# Patient Record
Sex: Female | Born: 1960 | Race: White | Hispanic: No | Marital: Single | Smoking: Never smoker
Health system: Southern US, Community
[De-identification: ages and names within clinical notes are randomized; demographics above are authoritative.]

## PROBLEM LIST (undated history)

## (undated) DIAGNOSIS — K219 Gastro-esophageal reflux disease without esophagitis: Secondary | ICD-10-CM

## (undated) DIAGNOSIS — F32A Depression, unspecified: Secondary | ICD-10-CM

## (undated) DIAGNOSIS — F329 Major depressive disorder, single episode, unspecified: Secondary | ICD-10-CM

## (undated) DIAGNOSIS — I7121 Aneurysm of the ascending aorta, without rupture: Secondary | ICD-10-CM

## (undated) DIAGNOSIS — C50919 Malignant neoplasm of unspecified site of unspecified female breast: Secondary | ICD-10-CM

## (undated) DIAGNOSIS — M1990 Unspecified osteoarthritis, unspecified site: Secondary | ICD-10-CM

## (undated) DIAGNOSIS — I10 Essential (primary) hypertension: Secondary | ICD-10-CM

## (undated) DIAGNOSIS — Z9889 Other specified postprocedural states: Secondary | ICD-10-CM

## (undated) DIAGNOSIS — T4145XA Adverse effect of unspecified anesthetic, initial encounter: Secondary | ICD-10-CM

## (undated) DIAGNOSIS — F419 Anxiety disorder, unspecified: Secondary | ICD-10-CM

## (undated) DIAGNOSIS — I499 Cardiac arrhythmia, unspecified: Secondary | ICD-10-CM

## (undated) DIAGNOSIS — T8859XA Other complications of anesthesia, initial encounter: Secondary | ICD-10-CM

## (undated) DIAGNOSIS — J189 Pneumonia, unspecified organism: Secondary | ICD-10-CM

## (undated) DIAGNOSIS — R600 Localized edema: Secondary | ICD-10-CM

## (undated) DIAGNOSIS — R112 Nausea with vomiting, unspecified: Secondary | ICD-10-CM

## (undated) DIAGNOSIS — D649 Anemia, unspecified: Secondary | ICD-10-CM

## (undated) DIAGNOSIS — K429 Umbilical hernia without obstruction or gangrene: Secondary | ICD-10-CM

## (undated) HISTORY — PX: MULTIPLE TOOTH EXTRACTIONS: SHX2053

## (undated) HISTORY — PX: RECONSTRUCTION BREAST IMMEDIATE / DELAYED W/ TISSUE EXPANDER: SUR1077

## (undated) HISTORY — PX: TOE OSTEOTOMY: SHX1071

## (undated) HISTORY — PX: HERNIA REPAIR: SHX51

## (undated) HISTORY — PX: ESOPHAGOGASTRODUODENOSCOPY: SHX1529

## (undated) HISTORY — PX: COLONOSCOPY: SHX174

## (undated) HISTORY — PX: MASTECTOMY: SHX3

---

## 1987-10-17 DIAGNOSIS — J189 Pneumonia, unspecified organism: Secondary | ICD-10-CM | POA: Insufficient documentation

## 1987-10-17 HISTORY — DX: Pneumonia, unspecified organism: J18.9

## 2010-10-16 DIAGNOSIS — C50919 Malignant neoplasm of unspecified site of unspecified female breast: Secondary | ICD-10-CM

## 2010-10-16 DIAGNOSIS — F419 Anxiety disorder, unspecified: Secondary | ICD-10-CM | POA: Insufficient documentation

## 2010-10-16 HISTORY — DX: Malignant neoplasm of unspecified site of unspecified female breast: C50.919

## 2010-10-16 HISTORY — PX: ABLATION: SHX5711

## 2010-10-16 HISTORY — PX: BREAST SURGERY: SHX581

## 2011-07-13 ENCOUNTER — Ambulatory Visit (HOSPITAL_BASED_OUTPATIENT_CLINIC_OR_DEPARTMENT_OTHER)
Admission: RE | Admit: 2011-07-13 | Discharge: 2011-07-13 | Disposition: A | Discharge status: Home / Self Care | Payer: Medicaid Other | Source: Ambulatory Visit | Attending: Plastic Surgery | Admitting: Plastic Surgery

## 2011-07-13 ENCOUNTER — Other Ambulatory Visit: Payer: Self-pay | Admitting: Plastic Surgery

## 2011-07-13 DIAGNOSIS — C50919 Malignant neoplasm of unspecified site of unspecified female breast: Secondary | ICD-10-CM | POA: Insufficient documentation

## 2011-07-13 DIAGNOSIS — Z9010 Acquired absence of unspecified breast and nipple: Secondary | ICD-10-CM | POA: Insufficient documentation

## 2011-07-13 DIAGNOSIS — I10 Essential (primary) hypertension: Secondary | ICD-10-CM | POA: Insufficient documentation

## 2011-07-13 LAB — POCT I-STAT, CHEM 8
BUN: 11 mg/dL (ref 6–23)
Calcium, Ion: 1.14 mmol/L (ref 1.12–1.32)
Chloride: 102 mEq/L (ref 96–112)
Creatinine, Ser: 0.7 mg/dL (ref 0.50–1.10)
Glucose, Bld: 107 mg/dL — ABNORMAL HIGH (ref 70–99)

## 2011-07-19 NOTE — Op Note (Signed)
**Note De-Identified [name] Obfuscation** NAMEMarland Kitchen  [name], Janet [name]            ACCOUNT NO.:  [id number]  MEDICAL RECORD NO.:  [id number]  LOCATION:                                 FACILITY:  PHYSICIAN:  Wayland Denis, DO      DATE OF BIRTH:  1960-11-27  DATE OF PROCEDURE:  07/13/2011 DATE OF DISCHARGE:                              OPERATIVE REPORT   PREOPERATIVE DIAGNOSIS:  Left breast cancer status post mastectomy.  POSTOPERATIVE DIAGNOSIS:  Left breast cancer status post mastectomy.  PROCEDURE:  Left breast reconstruction with Expander and Flex HD placement.  ATTENDING SURGEON:  Wayland Denis, DO  ANESTHESIA:  General.  INDICATIONS FOR PROCEDURE:  The patient is a 50 year old white female who underwent left mastectomy.  She did not require radiation and she presents for reconstruction.  She was seen in the holding.  Risks and complications were explained.  Consent was confirmed.  DESCRIPTION OF PROCEDURE:  The patient was taken to the operating room, placed on the operating room table in a supine position.  General anesthesia was administered.  Once adequate, time-out was called.  All information was confirmed to be correct.  She was prepped and draped in the usual sterile fashion.  A 15-blade was used to make an incision on either side of the mastectomy scar in order to remove it and send it to Pathology.  Bovie was then used to dissect down to the pectoralis major muscle.  There was quite a bit of scarring, but we were able to free the pectoralis from the skin flaps. The Bovie was then used to dissect underneath the pectoralis and create a pocket.  The Flex HD 4 x 16 cm was prepared according to the manufacture's guidelines, it was placed into the pocket with the porous side up.  It was then tacked with simple interrupted and running 2-0 PDS to the edge of the pectoralis major muscle and then to the inframammary fold.  It was fenestrated prior to placing it in.  The 450 Mentor medium profile style 7200 Expander  was prepared according to the manufacture guidelines.  The air was evacuated, it was soaked in antibiotic solution, and then it was placed into the pocket under the Flex HD in the pectoralis major muscle.  150 mL of injectable saline was infused into the Expander.  The Flex HD was then tacked laterally with a  2-0 PDS.  Hemostasis was achieved using electrocautery.  Prior to placing, the Flex HD and the Expander, the pocket was irrigated with normal saline and antibiotic solution.  A 15-blade was used to create an entry for 19-Blake drain.  The drain was tacked to the skin with 3-0 silk and connected to the bulb suction.  The deep layers of the incision were closed with 3-0 Vicryl and 4-0 Vicryl and a running subcuticular 5-0 Monocryl.  Dermabond was applied.  ABDs and 4 inch gauze were used as a dressing with a breast binder.  The patient tolerated the procedure well.  There were no complications.  The stickers for the Flex HD and Expander are in the chart.    Claire Sanger, DO     CS/MEDQ  D:  07/13/2011  T:  07/13/2011  Job:  [id number]  Electronically Signed by Wayland Denis  on 07/19/2011 02:07:21 PM

## 2011-10-19 ENCOUNTER — Encounter (HOSPITAL_BASED_OUTPATIENT_CLINIC_OR_DEPARTMENT_OTHER): Payer: Self-pay | Admitting: *Deleted

## 2011-10-20 ENCOUNTER — Encounter (HOSPITAL_BASED_OUTPATIENT_CLINIC_OR_DEPARTMENT_OTHER): Payer: Self-pay | Admitting: *Deleted

## 2011-10-20 NOTE — Progress Notes (Signed)
Pt had tissue exp here 9/12

## 2011-10-23 ENCOUNTER — Ambulatory Visit (HOSPITAL_BASED_OUTPATIENT_CLINIC_OR_DEPARTMENT_OTHER)
Admission: RE | Admit: 2011-10-23 | Discharge: 2011-10-23 | Disposition: A | Discharge status: Home / Self Care | Payer: Medicaid Other | Source: Ambulatory Visit | Attending: Oral Surgery | Admitting: Oral Surgery

## 2011-10-23 ENCOUNTER — Ambulatory Visit (HOSPITAL_BASED_OUTPATIENT_CLINIC_OR_DEPARTMENT_OTHER): Payer: Medicaid Other | Admitting: Anesthesiology

## 2011-10-23 ENCOUNTER — Encounter (HOSPITAL_BASED_OUTPATIENT_CLINIC_OR_DEPARTMENT_OTHER): Payer: Self-pay | Admitting: Anesthesiology

## 2011-10-23 ENCOUNTER — Encounter (HOSPITAL_BASED_OUTPATIENT_CLINIC_OR_DEPARTMENT_OTHER)
Admission: RE | Disposition: A | Discharge status: Home / Self Care | Payer: Self-pay | Source: Ambulatory Visit | Attending: Oral Surgery

## 2011-10-23 ENCOUNTER — Encounter (HOSPITAL_BASED_OUTPATIENT_CLINIC_OR_DEPARTMENT_OTHER): Payer: Self-pay

## 2011-10-23 DIAGNOSIS — I10 Essential (primary) hypertension: Secondary | ICD-10-CM | POA: Insufficient documentation

## 2011-10-23 DIAGNOSIS — Z853 Personal history of malignant neoplasm of breast: Secondary | ICD-10-CM | POA: Insufficient documentation

## 2011-10-23 DIAGNOSIS — K029 Dental caries, unspecified: Secondary | ICD-10-CM | POA: Insufficient documentation

## 2011-10-23 DIAGNOSIS — E669 Obesity, unspecified: Secondary | ICD-10-CM | POA: Insufficient documentation

## 2011-10-23 HISTORY — DX: Other complications of anesthesia, initial encounter: T88.59XA

## 2011-10-23 HISTORY — DX: Essential (primary) hypertension: I10

## 2011-10-23 HISTORY — DX: Malignant neoplasm of unspecified site of unspecified female breast: C50.919

## 2011-10-23 HISTORY — DX: Adverse effect of unspecified anesthetic, initial encounter: T41.45XA

## 2011-10-23 HISTORY — PX: MULTIPLE EXTRACTIONS WITH ALVEOLOPLASTY: SHX5342

## 2011-10-23 SURGERY — MULTIPLE EXTRACTION WITH ALVEOLOPLASTY
Anesthesia: General | Site: Mouth | Wound class: Clean Contaminated

## 2011-10-23 MED ORDER — LIDOCAINE-EPINEPHRINE 2 %-1:100000 IJ SOLN
INTRAMUSCULAR | Status: DC | PRN
  Administered 2011-10-23: 13 mL

## 2011-10-23 MED ORDER — MIDAZOLAM HCL 2 MG/2ML IJ SOLN: 0.5000 mg | INTRAMUSCULAR | Status: DC | PRN

## 2011-10-23 MED ORDER — LACTATED RINGERS IV SOLN
INTRAVENOUS | Status: DC
  Administered 2011-10-23: 08:00:00 via INTRAVENOUS

## 2011-10-23 MED ORDER — OXYCODONE-ACETAMINOPHEN 5-325 MG PO TABS
1.0000 | ORAL_TABLET | ORAL | Status: DC | PRN
  Administered 2011-10-23: 1 via ORAL

## 2011-10-23 MED ORDER — FENTANYL CITRATE 0.05 MG/ML IJ SOLN
INTRAMUSCULAR | Status: DC | PRN
  Administered 2011-10-23: 50 ug via INTRAVENOUS

## 2011-10-23 MED ORDER — DEXAMETHASONE SODIUM PHOSPHATE 4 MG/ML IJ SOLN
INTRAMUSCULAR | Status: DC | PRN
  Administered 2011-10-23: 10 mg via INTRAVENOUS

## 2011-10-23 MED ORDER — FENTANYL CITRATE 0.05 MG/ML IJ SOLN: 50.0000 ug | INTRAMUSCULAR | Status: DC | PRN

## 2011-10-23 MED ORDER — MORPHINE SULFATE 2 MG/ML IJ SOLN: 0.0500 mg/kg | INTRAMUSCULAR | Status: DC | PRN

## 2011-10-23 MED ORDER — METOCLOPRAMIDE HCL 5 MG/ML IJ SOLN
INTRAMUSCULAR | Status: DC | PRN
  Administered 2011-10-23: 10 mg via INTRAVENOUS

## 2011-10-23 MED ORDER — SUCCINYLCHOLINE CHLORIDE 20 MG/ML IJ SOLN
INTRAMUSCULAR | Status: DC | PRN
  Administered 2011-10-23: 100 mg via INTRAVENOUS

## 2011-10-23 MED ORDER — FENTANYL CITRATE 0.05 MG/ML IJ SOLN: 25.0000 ug | INTRAMUSCULAR | Status: DC | PRN

## 2011-10-23 MED ORDER — PROPOFOL 10 MG/ML IV EMUL
INTRAVENOUS | Status: DC | PRN
  Administered 2011-10-23: 250 mg via INTRAVENOUS

## 2011-10-23 MED ORDER — OXYCODONE-ACETAMINOPHEN 5-325 MG PO TABS: 1.0000 | ORAL_TABLET | ORAL | Status: AC | PRN

## 2011-10-23 MED ORDER — METOCLOPRAMIDE HCL 5 MG/ML IJ SOLN: 10.0000 mg | Freq: Once | INTRAMUSCULAR | Status: DC | PRN

## 2011-10-23 MED ORDER — MIDAZOLAM HCL 5 MG/5ML IJ SOLN
INTRAMUSCULAR | Status: DC | PRN
  Administered 2011-10-23: 2 mg via INTRAVENOUS

## 2011-10-23 SURGICAL SUPPLY — 47 items
BLADE DERMATOME SS (BLADE) IMPLANT
BLADE SURG 15 STRL LF DISP TIS (BLADE) ×1 IMPLANT
BLADE SURG 15 STRL SS (BLADE) ×1
BUR CROSS CUT FISSURE 1.6 (BURR) ×2 IMPLANT
BUR EGG 3PK/BX (BURR) IMPLANT
BUR EGG ELITE 4.0 (BURR) ×2 IMPLANT
BUR FAST CUTTING MED (BURR) IMPLANT
BUR FISSURE CARBIDE (BURR) IMPLANT
BUR RND POLISHING (BURR) IMPLANT
BUR SIDE CUT (BURR) IMPLANT
CANISTER SUCTION 1200CC (MISCELLANEOUS) ×2 IMPLANT
CATH ROBINSON RED A/P 10FR (CATHETERS) IMPLANT
CLOTH BEACON ORANGE TIMEOUT ST (SAFETY) ×2 IMPLANT
COVER MAYO STAND STRL (DRAPES) ×2 IMPLANT
COVER TABLE BACK 60X90 (DRAPES) ×2 IMPLANT
DECANTER SPIKE VIAL GLASS SM (MISCELLANEOUS) IMPLANT
DEPRESSOR TONGUE BLADE STERILE (MISCELLANEOUS) IMPLANT
DRAPE U-SHAPE 76X120 STRL (DRAPES) ×2 IMPLANT
DRSG TEGADERM 4X10 (GAUZE/BANDAGES/DRESSINGS) IMPLANT
GAUZE PACKING FOLDED 2  STR (GAUZE/BANDAGES/DRESSINGS) ×1
GAUZE PACKING FOLDED 2 STR (GAUZE/BANDAGES/DRESSINGS) ×1 IMPLANT
GAUZE PACKING IODOFORM 1/4X5 (PACKING) IMPLANT
GLOVE BIO SURGEON STRL SZ 6.5 (GLOVE) ×2 IMPLANT
GLOVE BIO SURGEON STRL SZ7.5 (GLOVE) ×2 IMPLANT
GLOVE BIOGEL PI IND STRL 6.5 (GLOVE) ×2 IMPLANT
GLOVE BIOGEL PI INDICATOR 6.5 (GLOVE) ×2
GOWN PREVENTION PLUS XLARGE (GOWN DISPOSABLE) ×4 IMPLANT
GOWN PREVENTION PLUS XXLARGE (GOWN DISPOSABLE) ×2 IMPLANT
IV NS 500ML (IV SOLUTION) ×1
IV NS 500ML BAXH (IV SOLUTION) ×1 IMPLANT
NEEDLE HYPO 22GX1.5 SAFETY (NEEDLE) ×2 IMPLANT
NS IRRIG 1000ML POUR BTL (IV SOLUTION) ×2 IMPLANT
PACK BASIN DAY SURGERY FS (CUSTOM PROCEDURE TRAY) ×2 IMPLANT
SPONGE SURGIFOAM ABS GEL 12-7 (HEMOSTASIS) IMPLANT
STRIP CLOSURE SKIN 1/2X4 (GAUZE/BANDAGES/DRESSINGS) ×2 IMPLANT
SUT CHROMIC 3 0 PS 2 (SUTURE) IMPLANT
SYR 20CC LL (SYRINGE) IMPLANT
SYR BULB 3OZ (MISCELLANEOUS) ×2 IMPLANT
SYR CONTROL 10ML LL (SYRINGE) ×2 IMPLANT
TOOTHBRUSH ADULT (PERSONAL CARE ITEMS) IMPLANT
TOWEL OR 17X24 6PK STRL BLUE (TOWEL DISPOSABLE) ×2 IMPLANT
TOWEL OR NON WOVEN STRL DISP B (DISPOSABLE) ×2 IMPLANT
TRAY DSU PREP LF (CUSTOM PROCEDURE TRAY) IMPLANT
TUBE CONNECTING 20X1/4 (TUBING) ×2 IMPLANT
TUBING IRRIGATION (MISCELLANEOUS) ×2 IMPLANT
WATER STERILE IRR 1000ML POUR (IV SOLUTION) IMPLANT
YANKAUER SUCT BULB TIP NO VENT (SUCTIONS) ×2 IMPLANT

## 2011-10-23 NOTE — Transfer of Care (Signed)
Immediate Anesthesia Transfer of Care Note  Patient: Janet [name]  Procedure(s) Performed:  MULTIPLE EXTRACION WITH ALVEOLOPLASTY  Patient Location: PACU  Anesthesia Type: General  Level of Consciousness: sedated  Airway & Oxygen Therapy: Patient Spontanous Breathing and Patient connected to face mask oxygen  Post-op Assessment: Report given to PACU RN and Post -op Vital signs reviewed and stable  Post vital signs: Reviewed and stable  Complications: No apparent anesthesia complications

## 2011-10-23 NOTE — Anesthesia Procedure Notes (Signed)
Procedure Name: Intubation Date/Time: 10/23/2011 7:45 AM Performed by: Jearld Shines Pre-anesthesia Checklist: Patient identified, Timeout performed, Emergency Drugs available, Suction available and Patient being monitored Patient Re-evaluated:Patient Re-evaluated prior to inductionOxygen Delivery Method: Circle System Utilized Preoxygenation: Pre-oxygenation with 100% oxygen Intubation Type: IV induction Ventilation: Mask ventilation without difficulty Laryngoscope Size: Mac and 3 Grade View: Grade I Tube type: Oral Tube size: 7.0 mm Number of attempts: 1 Airway Equipment and Method: patient positioned with wedge pillow Placement Confirmation: ETT inserted through vocal cords under direct vision,  positive ETCO2 and breath sounds checked- equal and bilateral Secured at: 22 cm Dental Injury: Teeth and Oropharynx as per pre-operative assessment

## 2011-10-23 NOTE — H&P (Signed)
HISTORY AND PHYSICAL  Janet [name] is a 51 y.o. female patient with CC: Dental pain.  No diagnosis found.   Past Medical History  Diagnosis Date  . Breast cancer 2012    lt  . Complication of anesthesia     hard to get iv started-has had Juj vein used  . Hypertension     Current Facility-Administered Medications  Medication Dose Route Frequency Provider Last Rate Last Dose  . fentaNYL (SUBLIMAZE) injection 50-100 mcg  50-100 mcg Intravenous PRN Constance Goltz, MD      . lactated ringers infusion   Intravenous Continuous Constance Goltz, MD      . midazolam (VERSED) injection 0.5-2 mg  0.5-2 mg Intravenous PRN Constance Goltz, MD       Allergies  Allergen Reactions  . Amoxicillin   . Biaxin   . Tape    Active Problems:  * No active hospital problems. *   Vitals: Blood pressure 144/87, pulse 56, temperature 98 F (36.7 C), temperature source Oral, resp. rate 20, height 5\' 6"  (1.676 m), weight 132.45 kg (292 lb), last menstrual period 10/20/2011, SpO2 97.00%. Lab results:No results found for this or any previous visit (from the past 24 hour(s)). Radiology Results: No results found. General appearance: alert, cooperative and no distress Head: Normocephalic, without obvious abnormality, atraumatic Eyes: conjunctivae/corneas clear. PERRL, EOM's intact. Fundi benign. Ears: normal TM's and external ear canals both ears Nose: Nares normal. Septum midline. Mucosa normal. No drainage or sinus tenderness. Throat: dental caries teeth #'s 2, 8, 9, 17, 18, 19, 30, 31, 32 Neck: no adenopathy, no JVD, supple, symmetrical, trachea midline and thyroid not enlarged, symmetric, no tenderness/mass/nodules Resp: clear to auscultation bilaterally Cardio: regular rate and rhythm, S1, S2 normal, no murmur, click, rub or gallop Extremities: extremities normal, atraumatic, no cyanosis or edema Neurologic: Grossly normal  Assessment: 50 YO WF HTN, Breast CA with  multiple nonrestorable teeth.  Plan:Dental extractions teeth #'s 2, 8, 9, 17, 18, 19, 30, 31, 32, alveoloplasty. General anesthesia.  Georgia Lopes 10/23/2011

## 2011-10-23 NOTE — Op Note (Signed)
10/23/2011  8:13 AM  PATIENT:  Lowella Dell  51 y.o. female  PRE-OPERATIVE DIAGNOSIS:  non restorable teeth #'s 2, 8, 9, 17, 18, 19, 30, 31, 32  POST-OPERATIVE DIAGNOSIS:  SAME  PROCEDURE:  Procedure(s): Removal  MULTIPLE EXTRACION WITH ALVEOLOPLASTY non restorable teeth #'s 2, 8, 9, 17, 18, 19, 30, 31, 32  SURGEON:  Surgeon(s): [employer]  ANESTHESIA:   local and general  EBL:  minimal  DRAINS: none   LOCAL MEDICATIONS USED: 2%  LIDOCAINE 1:100,000 epi 13 CC  SPECIMEN:  No Specimen  COUNTS:  YES  PLAN OF CARE: Discharge to home after PACU  PATIENT DISPOSITION:  PACU - hemodynamically stable.   PROCEDURE DETAILS: Dictation # [id number]  Georgia Lopes, DMD 10/23/2011 8:13 AM

## 2011-10-23 NOTE — Anesthesia Postprocedure Evaluation (Signed)
Anesthesia Post Note  Patient: Janet [name]  Procedure(s) Performed:  MULTIPLE EXTRACION WITH ALVEOLOPLASTY  Anesthesia type: General  Patient location: PACU  Post pain: Pain level controlled  Post assessment: Patient's Cardiovascular Status Stable  Last Vitals:  Filed Vitals:   10/23/11 0845  BP: 154/81  Pulse: 60  Temp:   Resp: 19    Post vital signs: Reviewed and stable  Level of consciousness: alert  Complications: No apparent anesthesia complications

## 2011-10-23 NOTE — Anesthesia Preprocedure Evaluation (Addendum)
Anesthesia Evaluation  Patient identified by MRN, date of birth, ID band Patient awake    Reviewed: Allergy & Precautions, H&P , NPO status , Patient's Chart, lab work & pertinent test results, reviewed documented beta blocker date and time   History of Anesthesia Complications (+) DIFFICULT IV STICK / SPECIAL LINE  Airway Mallampati: II TM Distance: >3 FB Neck ROM: full    Dental   Pulmonary neg pulmonary ROS,          Cardiovascular hypertension, On Medications     Neuro/Psych Negative Neurological ROS  Negative Psych ROS   GI/Hepatic negative GI ROS, Neg liver ROS,   Endo/Other  Negative Endocrine ROSMorbid obesity  Renal/GU negative Renal ROS  Genitourinary negative   Musculoskeletal   Abdominal   Peds  Hematology negative hematology ROS (+)   Anesthesia Other Findings See surgeon's H&P   Reproductive/Obstetrics negative OB ROS                          Anesthesia Physical Anesthesia Plan  ASA: III  Anesthesia Plan: General   Post-op Pain Management:    Induction: Intravenous  Airway Management Planned: Nasal ETT  Additional Equipment:   Intra-op Plan:   Post-operative Plan: Extubation in OR  Informed Consent: I have reviewed the patients History and Physical, chart, labs and discussed the procedure including the risks, benefits and alternatives for the proposed anesthesia with the patient or authorized representative who has indicated his/her understanding and acceptance.     Plan Discussed with: CRNA and Surgeon  Anesthesia Plan Comments:        Anesthesia Quick Evaluation

## 2011-10-23 NOTE — Op Note (Signed)
NAME:  Janet [name], Janet [name]                 ACCOUNT NO.:  MEDICAL RECORD NO.:  [id number]  LOCATION:                                 FACILITY:  PHYSICIAN:  Georgia Lopes, M.D.       DATE OF BIRTH:  DATE OF PROCEDURE:  10/23/2011 DATE OF DISCHARGE:                              OPERATIVE REPORT   PREOPERATIVE DIAGNOSES:  Dental caries, nonrestorable teeth numbers 2, 8, 9, 17, 18, 19, 30, 31, 32.  POSTOPERATIVE DIAGNOSES:  Dental caries, nonrestorable teeth numbers 2, 8, 9, 17, 18, 19, 30, 31, 32.  PROCEDURE:  Removal of nonrestorable teeth numbers 2, 8, 9, 17, 18, 19, 30, 31, 32.  Alveoplasty, right and left mandible.  SURGEON:  Georgia Lopes, MD  ANESTHESIA:  General and local.  INDICATIONS FOR PROCEDURE:  Janet [name] is a 51 year old female who presented to my office at the request of her general dentist for removal of multiple nonrestorable teeth secondary to dental caries.  Her past medical history is complicated by the fact that she is hypertensive, obese, and has history of breast cancer.  Because of the number of teeth to be removed in the difficulty anticipated, it was recommended that the patient had the procedure done while intubated under general anesthesia.  PROCEDURE:  The patient was taken to the operating room and placed on the table in supine position.  General anesthesia was administered intravenously and an oral endotracheal tube was placed and marked.  The eyes were protected.  The patient was draped for the procedure.  A time- out was done.  The posterior pharynx was suctioned and a throat pack was placed.  Lidocaine 2% with 1:100,000 epinephrine was infiltrated in an inferior alveolar block on the right and left side and buccal and palatal infiltration in the maxilla around teeth to be removed, a total of 13 mL was utilized.  Bite block was placed on the right side of the mouth and a sweetheart retractor was used to retract the tongue.  Then, a #15 blade was  used make a full-thickness incision around teeth numbers 17, 18, and 19 in the mandible and in the maxilla around teeth numbers 8 and 9.  The periosteum was reflected from around these teeth on the buccal and lingual surface in the mandible and the buccal and palatal surface in the maxilla.  Then, the dental handpiece was used to remove interproximal bone around each of these teeth.  A 301 elevator was then used to elevate the teeth.  The teeth were then removed from the mouth with the lower universal forceps and the upper universal forceps.  Then, the sockets were curetted.  The periosteum was reflected in the mandible to expose the alveolar crestal  bone.  Seldin retractor was used to protect the lingual tissues.  The egg-shaped bur in the handpiece was used to perform the alveoplasty.  Then, the area was irrigated and closed with 3-0 chromic.  The maxillary incision was not treated with alveoplasty, but the incision was closed with 3-0 chromic as well. Then, the bite block and sweetheart repositioned to the other side of the mouth.  A #15 blade was used to  make a full-thickness incision around teeth numbers 2 on the buccal and palatal aspects and around teeth numbers 30, 31, and 32 on the buccal and lingual aspects.  The periosteum was reflected and then interproximal bone was removed with the fissure bur in the handpiece with irrigation.  The teeth were elevated with 301 elevator and removed from the mouth with the universal forceps.  The sockets were curetted and the periosteum was reflected in the mandible to expose the buccal and lingual aspects of the alveolar crest.  The egg-shaped bur was used to perform the alveoplasty.  Then, the areas were irrigated and closed with 3-0 chromic.  The oral cavity was inspected and found to have good contour, closure, and hemostasis. The oral cavity was irrigated and suctioned.  Throat pack was removed. The patient was taken to the recovery room  breathing spontaneously in good condition.  ESTIMATED BLOOD LOSS:  Minimum.  COMPLICATIONS:  None.  SPECIMENS:  None.     Georgia Lopes, M.D.     SMJ/MEDQ  D:  10/23/2011  T:  10/23/2011  Job:  [id number]

## 2011-10-24 ENCOUNTER — Encounter (HOSPITAL_BASED_OUTPATIENT_CLINIC_OR_DEPARTMENT_OTHER): Payer: Self-pay | Admitting: Oral Surgery

## 2012-01-01 ENCOUNTER — Other Ambulatory Visit: Payer: Self-pay | Admitting: Obstetrics & Gynecology

## 2012-01-12 ENCOUNTER — Encounter (HOSPITAL_COMMUNITY): Payer: Self-pay

## 2012-01-22 NOTE — OR Nursing (Signed)
Moved to 0730 per office

## 2012-01-24 ENCOUNTER — Encounter (HOSPITAL_COMMUNITY)
Admission: RE | Admit: 2012-01-24 | Discharge: 2012-01-24 | Disposition: A | Discharge status: Home / Self Care | Payer: Medicaid Other | Source: Ambulatory Visit | Attending: Obstetrics & Gynecology | Admitting: Obstetrics & Gynecology

## 2012-01-24 ENCOUNTER — Encounter (HOSPITAL_COMMUNITY): Payer: Self-pay

## 2012-01-24 HISTORY — DX: Anemia, unspecified: D64.9

## 2012-01-24 HISTORY — DX: Umbilical hernia without obstruction or gangrene: K42.9

## 2012-01-24 LAB — BASIC METABOLIC PANEL
BUN: 16 mg/dL (ref 6–23)
Chloride: 102 mEq/L (ref 96–112)
GFR calc Af Amer: >90 mL/min (ref >90)
GFR calc non Af Amer: 80 mL/min — ABNORMAL LOW (ref >90)
Glucose, Bld: 110 mg/dL — ABNORMAL HIGH (ref 70–99)
Potassium: 4.2 mEq/L (ref 3.5–5.1)
Sodium: 140 mEq/L (ref 135–145)

## 2012-01-24 LAB — CBC
HCT: 37.2 % (ref 36.0–46.0)
Hemoglobin: 12.3 g/dL (ref 12.0–15.0)
MCHC: 33.1 g/dL (ref 30.0–36.0)
RDW: 16.6 % — ABNORMAL HIGH (ref 11.5–15.5)
WBC: 8.8 10*3/uL (ref 4.0–10.5)

## 2012-01-24 NOTE — Patient Instructions (Signed)
YOUR PROCEDURE IS SCHEDULED ON:01/26/12  ENTER THROUGH THE MAIN ENTRANCE OF [facility] AT:6am  USE DESK PHONE AND DIAL [phone] TO INFORM us OF YOUR ARRIVAL  CALL [phone] IF YOU HAVE ANY QUESTIONS OR PROBLEMS PRIOR TO YOUR ARRIVAL.  REMEMBER: DO NOT EAT OR DRINK AFTER MIDNIGHT : Thursday    YOU MAY BRUSH YOUR TEETH THE MORNING OF SURGERY   TAKE THESE MEDICINES THE DAY OF SURGERY WITH SIP OF WATER:BP med   DO NOT WEAR JEWELRY, EYE MAKEUP, LIPSTICK OR DARK FINGERNAIL POLISH DO NOT WEAR LOTIONS  DO NOT SHAVE FOR 48 HOURS PRIOR TO SURGERY  YOU WILL NOT BE ALLOWED TO DRIVE YOURSELF HOME.  NAME OF DRIVER:Helen or Rusty

## 2012-01-26 ENCOUNTER — Ambulatory Visit (HOSPITAL_COMMUNITY)
Admission: RE | Admit: 2012-01-26 | Discharge: 2012-01-26 | Disposition: A | Discharge status: Home / Self Care | Payer: Medicaid Other | Source: Ambulatory Visit | Attending: Obstetrics & Gynecology | Admitting: Obstetrics & Gynecology

## 2012-01-26 ENCOUNTER — Encounter (HOSPITAL_COMMUNITY): Payer: Self-pay | Admitting: Anesthesiology

## 2012-01-26 ENCOUNTER — Ambulatory Visit (HOSPITAL_COMMUNITY): Payer: Medicaid Other | Admitting: Anesthesiology

## 2012-01-26 ENCOUNTER — Encounter (HOSPITAL_COMMUNITY)
Admission: RE | Disposition: A | Discharge status: Home / Self Care | Payer: Self-pay | Source: Ambulatory Visit | Attending: Obstetrics & Gynecology

## 2012-01-26 DIAGNOSIS — Z01818 Encounter for other preprocedural examination: Secondary | ICD-10-CM | POA: Insufficient documentation

## 2012-01-26 DIAGNOSIS — Z01812 Encounter for preprocedural laboratory examination: Secondary | ICD-10-CM | POA: Insufficient documentation

## 2012-01-26 DIAGNOSIS — N840 Polyp of corpus uteri: Secondary | ICD-10-CM | POA: Insufficient documentation

## 2012-01-26 DIAGNOSIS — N939 Abnormal uterine and vaginal bleeding, unspecified: Secondary | ICD-10-CM

## 2012-01-26 DIAGNOSIS — N920 Excessive and frequent menstruation with regular cycle: Secondary | ICD-10-CM | POA: Insufficient documentation

## 2012-01-26 HISTORY — PX: DILATION AND CURETTAGE OF UTERUS: SHX78

## 2012-01-26 SURGERY — DILATION AND CURETTAGE
Anesthesia: General | Site: Vagina | Laterality: Bilateral | Wound class: Clean Contaminated

## 2012-01-26 MED ORDER — LIDOCAINE HCL 1 % IJ SOLN
INTRAMUSCULAR | Status: DC | PRN
  Administered 2012-01-26: 7 mL

## 2012-01-26 MED ORDER — FENTANYL CITRATE 0.05 MG/ML IJ SOLN
INTRAMUSCULAR | Status: DC | PRN
  Administered 2012-01-26: 100 ug via INTRAVENOUS

## 2012-01-26 MED ORDER — PROPOFOL 10 MG/ML IV EMUL
INTRAVENOUS | Status: AC
  Filled 2012-01-26: qty 20

## 2012-01-26 MED ORDER — LACTATED RINGERS IV SOLN
INTRAVENOUS | Status: DC
  Administered 2012-01-26 (×2): via INTRAVENOUS

## 2012-01-26 MED ORDER — KETOROLAC TROMETHAMINE 30 MG/ML IJ SOLN
INTRAMUSCULAR | Status: AC
  Filled 2012-01-26: qty 1

## 2012-01-26 MED ORDER — METOCLOPRAMIDE HCL 5 MG/ML IJ SOLN: 10.0000 mg | Freq: Once | INTRAMUSCULAR | Status: DC | PRN

## 2012-01-26 MED ORDER — DEXAMETHASONE SODIUM PHOSPHATE 10 MG/ML IJ SOLN
INTRAMUSCULAR | Status: DC | PRN
  Administered 2012-01-26: 10 mg via INTRAVENOUS

## 2012-01-26 MED ORDER — MIDAZOLAM HCL 2 MG/2ML IJ SOLN
INTRAMUSCULAR | Status: AC
  Filled 2012-01-26: qty 2

## 2012-01-26 MED ORDER — KETOROLAC TROMETHAMINE 30 MG/ML IJ SOLN
INTRAMUSCULAR | Status: DC | PRN
  Administered 2012-01-26: 30 mg via INTRAVENOUS

## 2012-01-26 MED ORDER — MIDAZOLAM HCL 5 MG/5ML IJ SOLN
INTRAMUSCULAR | Status: DC | PRN
  Administered 2012-01-26: 2 mg via INTRAVENOUS

## 2012-01-26 MED ORDER — FENTANYL CITRATE 0.05 MG/ML IJ SOLN
INTRAMUSCULAR | Status: AC
  Filled 2012-01-26: qty 2

## 2012-01-26 MED ORDER — DEXAMETHASONE SODIUM PHOSPHATE 10 MG/ML IJ SOLN
INTRAMUSCULAR | Status: AC
  Filled 2012-01-26: qty 1

## 2012-01-26 MED ORDER — LIDOCAINE HCL (CARDIAC) 20 MG/ML IV SOLN
INTRAVENOUS | Status: DC | PRN
  Administered 2012-01-26: 100 mg via INTRAVENOUS

## 2012-01-26 MED ORDER — OXYCODONE-ACETAMINOPHEN 5-325 MG PO TABS: 2.0000 | ORAL_TABLET | Freq: Four times a day (QID) | ORAL | Status: AC | PRN

## 2012-01-26 MED ORDER — LIDOCAINE HCL (CARDIAC) 20 MG/ML IV SOLN
INTRAVENOUS | Status: AC
  Filled 2012-01-26: qty 5

## 2012-01-26 MED ORDER — FENTANYL CITRATE 0.05 MG/ML IJ SOLN: 25.0000 ug | INTRAMUSCULAR | Status: DC | PRN

## 2012-01-26 MED ORDER — MEPERIDINE HCL 25 MG/ML IJ SOLN: 6.2500 mg | INTRAMUSCULAR | Status: DC | PRN

## 2012-01-26 MED ORDER — ONDANSETRON HCL 4 MG/2ML IJ SOLN
INTRAMUSCULAR | Status: DC | PRN
  Administered 2012-01-26: 4 mg via INTRAVENOUS

## 2012-01-26 MED ORDER — ONDANSETRON HCL 4 MG/2ML IJ SOLN
INTRAMUSCULAR | Status: AC
  Filled 2012-01-26: qty 2

## 2012-01-26 MED ORDER — PROPOFOL 10 MG/ML IV EMUL
INTRAVENOUS | Status: DC | PRN
  Administered 2012-01-26: 200 mg via INTRAVENOUS

## 2012-01-26 SURGICAL SUPPLY — 12 items
ABLATOR ENDOMETRIAL BIPOLAR (ABLATOR) ×3 IMPLANT
CANISTER SUCTION 2500CC (MISCELLANEOUS) ×3 IMPLANT
CATH ROBINSON RED A/P 16FR (CATHETERS) ×3 IMPLANT
CLOTH BEACON ORANGE TIMEOUT ST (SAFETY) ×3 IMPLANT
CONTAINER PREFILL 10% NBF 60ML (FORM) ×6 IMPLANT
GLOVE BIO SURGEON STRL SZ 6.5 (GLOVE) ×6 IMPLANT
GOWN PREVENTION PLUS LG XLONG (DISPOSABLE) ×6 IMPLANT
GOWN STRL REIN XL XLG (GOWN DISPOSABLE) ×3 IMPLANT
NEEDLE SPNL 20GX3.5 QUINCKE YW (NEEDLE) IMPLANT
PACK HYSTEROSCOPY LF (CUSTOM PROCEDURE TRAY) ×3 IMPLANT
TOWEL OR 17X24 6PK STRL BLUE (TOWEL DISPOSABLE) ×6 IMPLANT
WATER STERILE IRR 1000ML POUR (IV SOLUTION) ×3 IMPLANT

## 2012-01-26 NOTE — Anesthesia Postprocedure Evaluation (Signed)
Anesthesia Post Note  Patient: Janet [name]  Procedure(s) Performed: Procedure(s) (LRB): DILATATION AND CURETTAGE ()  Anesthesia type: GA  Patient location: PACU  Post pain: Pain level controlled  Post assessment: Post-op Vital signs reviewed  Last Vitals:  Filed Vitals:   01/26/12 0900  BP:   Pulse:   Temp: 36.8 C  Resp:     Post vital signs: Reviewed  Level of consciousness: sedated  Complications: No apparent anesthesia complications

## 2012-01-26 NOTE — Discharge Instructions (Signed)
D&C or Vacuum Curettage Care After Read the instructions below. Refer to this sheet in the next few weeks. These instructions provide you with general information on caring for yourself after you leave the hospital. Your caregiver may also give you specific instructions.  D&C  is a minor operation. A D&C involves the stretching (dilatation) of the cervix and scraping (curettage) of the inside lining of the uterus. You may have light cramping and bleeding for a couple of days to two weeks after the procedure. This procedure may be done in a hospital, outpatient clinic, or doctor's office. You may be given a drug to make you sleep (general anesthetic) or a drug that numbs the area (local anesthetic) in and around the cervix. HOME CARE INSTRUCTIONS  Do not drive for 24 hours.   Wait one week before returning to strenuous activities.   Take your temperature two times a day for 4 days and write it down. Provide these temperatures to your caregiver if they are abnormal (above 98.6 F or 37.0 C).   Avoid long periods of standing, and do no heavy lifting (more than 10 pounds), pushing or pulling.   Limit stair climbing to once or twice a day.   Take rest periods often.   You may resume your usual diet.   Drink plenty of fluids (6-8 glasses a day).   You should return to your usual bowel function. If constipation should occur, you may:   Take a mild laxative with permission from your caregiver.   Add fruit and bran to your diet.   Drink more fluids. This helps with constipation.   Take showers instead of baths until your caregiver gives you permission to take baths.   Do not go swimming or use a hot tub until your caregiver gives you permission.   Try to have someone with you or available for you the first 24 to 48 hours, especially if you had a general anesthetic.   Do not douche, use tampons, or have intercourse until after your follow-up appointment, or when your caregiver approves.     Only take over-the-counter or prescription medicines for pain, discomfort, or fever as directed by your caregiver. Do not take aspirin. It can cause bleeding.   If a prescription was given, follow your caregiver's directions. You may be given a medicine that kills germs (antibiotic) to prevent an infection.   Keep all your follow-up appointments recommended by your caregiver.  SEEK MEDICAL CARE IF:  You have increasing cramps or pain not relieved with medication.   You develop belly (abdominal) pain which does not seem to be related to the same area of earlier cramping and pain.   You feel dizzy or feel like fainting.   You have bad smelling vaginal discharge.   You develop a rash.   You develop a reaction or allergy to your medication.  SEEK IMMEDIATE MEDICAL CARE IF:  Bleeding is heavier than a normal menstrual period.   You have an oral temperature above 100.6, not controlled by medicine.   You develop chest pain.   You develop shortness of breath.   You pass out.   You develop pain in your shoulder strap area.   You develop heavy vaginal bleeding with or without blood clots.  MAKE SURE YOU:   Understand these instructions.   Will watch your condition.   Will get help right away if you are not doing well or get worse.  UPDATED HEALTH PRACTICES  A PAP smear is  done to screen for cervical cancer.   The first PAP smear should be done at age 66.   Between ages 23 and 9, PAP smears are repeated every 2 years.   Beginning at age 38, you are advised to have a PAP smear every 3 years as long as your past 3 PAP smears have been normal.   Some women have medical problems that increase the chance of getting cervical cancer. Talk to your caregiver about these problems. It is especially important to talk to your caregiver if a new problem develops soon after your last PAP smear. In these cases, your caregiver may recommend more frequent screening and Pap smears.   The  above recommendations are the same for women who have or have not gotten the vaccine for HPV (Human Papillomavirus).   If you had a hysterectomy for a problem that was not a cancer or a condition that could lead to cancer, then you no longer need Pap smears.   If you are between ages 7 and 42, and you have had normal Pap smears going back 10 years, you no longer need Pap smears.   If you have had past treatment for cervical cancer or a condition that could lead to cancer, you need Pap smears and screening for cancer for at least 20 years after your treatment.   Continue monthly self-breast examinations. Your caregiver can provide information and instructions for self-breast examination.  Document Released: 09/29/2000 Document Re-Released: 03/22/2010 [facility] Patient Information 2011 [place], [place].

## 2012-01-26 NOTE — Op Note (Signed)
Preoperative diagnosis: dysfunctional uterine bleeding  Postoperative diagnosis: intermenstrual bleeding and endometrial polyp  Procedure: Diagnostic dilatation and curettage, polypectomy Surgeon: Antionette Char A  Anesthesia: LMA  Estimated blood loss: Minimal  Urine output: 200 ml  IV Fluids: per Anesthesiology  Complications: None  Specimen: PATHOLOGY  Operative Findings: The cervix was approximately 1 cm dilated.  A polypoid mass was protruding through the external os--fleshy, no necrosis.  Moderate endometrial curet tings were retrieved.  Description of procedure:   The patient was taken to the operating room and placed on the operating table in the semi-lithotomy position in [place] stirrups.  Examination under anesthesia was performed.  The patient was prepped and draped in the usual manner.  After a time-out had been completed, a speculum was placed in the vagina.  The anterior lip of the cervix was grasped with a single-toothed tenaculum.  The above noted mass was grasped with a sponge forceps and twisted off.   The uterine cavity sounded to 10 cm.    A Sims curette was used to perform an endometrial curettage.  The single tooth tenaculum was removed with minimal bleeding noted from the cervix.  All the instruments were removed from the vagina.  Final instrument counts were correct.  The patient was taken to the PACU in stable condition.

## 2012-01-26 NOTE — Anesthesia Preprocedure Evaluation (Signed)
Anesthesia Evaluation  Patient identified by MRN, date of birth, ID band Patient awake    Reviewed: Allergy & Precautions, H&P , NPO status , Patient's Chart, lab work & pertinent test results  History of Anesthesia Complications (+) PROLONGED EMERGENCE  Airway Mallampati: II TM Distance: >3 FB Neck ROM: Full    Dental No notable dental hx. (+) Teeth Intact   Pulmonary neg pulmonary ROS,  breath sounds clear to auscultation  Pulmonary exam normal       Cardiovascular hypertension, Pt. on medications Rhythm:Regular Rate:Normal     Neuro/Psych negative neurological ROS  negative psych ROS   GI/Hepatic negative GI ROS, Neg liver ROS,   Endo/Other  negative endocrine ROS  Renal/GU negative Renal ROS  negative genitourinary   Musculoskeletal negative musculoskeletal ROS (+)   Abdominal (+) + obese,  Abdomen: soft.    Peds  Hematology negative hematology ROS (+)   Anesthesia Other Findings   Reproductive/Obstetrics negative OB ROS                           Anesthesia Physical Anesthesia Plan  ASA: III  Anesthesia Plan: General   Post-op Pain Management:    Induction: Intravenous  Airway Management Planned: LMA  Additional Equipment:   Intra-op Plan:   Post-operative Plan: Extubation in OR  Informed Consent: I have reviewed the patients History and Physical, chart, labs and discussed the procedure including the risks, benefits and alternatives for the proposed anesthesia with the patient or authorized representative who has indicated his/her understanding and acceptance.   Dental advisory given  Plan Discussed with: CRNA, Anesthesiologist and Surgeon  Anesthesia Plan Comments:         Anesthesia Quick Evaluation

## 2012-01-26 NOTE — H&P (Signed)
  Chief Complaint: 51 y.o.  presents with AUB  Details of Present Illness: H/O of abnormal uterine bleeding.  Previous attempt to manage medically by another provider.  H/O breast ca.  BP 154/78  Pulse 62  Temp(Src) 97.9 F (36.6 C) (Oral)  Resp 18  SpO2 98%  Past Medical History  Diagnosis Date  . Hypertension   . Anemia     history of anemia from AUB  . Umbilical hernia   . Breast cancer 2012    left breast  . Complication of anesthesia     difficult IV access- never needed PICC line   History   Social History  . Marital Status: Single    Spouse Name: N/A    Number of Children: N/A  . Years of Education: N/A   Occupational History  . Not on file.   Social History Main Topics  . Smoking status: Never Smoker   . Smokeless tobacco: Not on file  . Alcohol Use: No  . Drug Use: No  . Sexually Active:    Other Topics Concern  . Not on file   Social History Narrative  . No narrative on file   No family history on file.  Pertinent items are noted in HPI.  Pre-Op Diagnosis: AUB   Planned Procedure: Procedure(s): DILATATION & CURETTAGE/HYSTEROSCOPY WITH NOVASURE ABLATION  I have reviewed the patient's history and have completed the physical exam and Liseth Wann is acceptable for surgery.  Roseanna Rainbow, MD 01/26/2012 7:29 AM

## 2012-01-26 NOTE — Progress Notes (Signed)
Phone call to Dr Antionette Char QI:[id number] restrictions.  Do not drive while taking narcotics.  Pt instructed.

## 2012-01-26 NOTE — Transfer of Care (Signed)
Immediate Anesthesia Transfer of Care Note  Patient: Janet [name]  Procedure(s) Performed: Procedure(s) (LRB): DILATATION AND CURETTAGE ()  Patient Location: PACU  Anesthesia Type: General  Level of Consciousness: oriented and sedated  Airway & Oxygen Therapy: Patient Spontanous Breathing and Patient connected to nasal cannula oxygen  Post-op Assessment: Report given to PACU RN and Post -op Vital signs reviewed and stable  Post vital signs: stable  Complications: No apparent anesthesia complications

## 2012-01-27 NOTE — Progress Notes (Signed)
RN performed call back to check on the patient.  Pt was very depressed.  RN has encouraged patient to follow up with per PCP about possible treatment for depression.  Pt states that she can't afford treatment. Pt is tearful on the phone. Pateint concerned she is going to have cancer return. Patient talked about being a burden to her family. RN has called Dr. Tamela Oddi to make her aware of same.  MD states she is going to have her nurse call her on Monday to check on her.

## 2012-01-29 ENCOUNTER — Encounter (HOSPITAL_COMMUNITY): Payer: Self-pay | Admitting: Obstetrics & Gynecology

## 2012-02-05 ENCOUNTER — Other Ambulatory Visit: Payer: Self-pay | Admitting: Obstetrics & Gynecology

## 2012-02-19 ENCOUNTER — Encounter (HOSPITAL_COMMUNITY): Payer: Self-pay | Admitting: *Deleted

## 2012-03-01 ENCOUNTER — Ambulatory Visit (HOSPITAL_COMMUNITY): Payer: Medicaid Other | Admitting: Anesthesiology

## 2012-03-01 ENCOUNTER — Encounter (HOSPITAL_COMMUNITY)
Admission: RE | Disposition: A | Discharge status: Home / Self Care | Payer: Self-pay | Source: Ambulatory Visit | Attending: Obstetrics & Gynecology

## 2012-03-01 ENCOUNTER — Encounter (HOSPITAL_COMMUNITY): Payer: Self-pay | Admitting: Obstetrics & Gynecology

## 2012-03-01 ENCOUNTER — Ambulatory Visit (HOSPITAL_COMMUNITY)
Admission: RE | Admit: 2012-03-01 | Discharge: 2012-03-01 | Disposition: A | Discharge status: Home / Self Care | Payer: Medicaid Other | Source: Ambulatory Visit | Attending: Obstetrics & Gynecology | Admitting: Obstetrics & Gynecology

## 2012-03-01 ENCOUNTER — Encounter (HOSPITAL_COMMUNITY): Payer: Self-pay | Admitting: *Deleted

## 2012-03-01 ENCOUNTER — Encounter (HOSPITAL_COMMUNITY): Payer: Self-pay | Admitting: Anesthesiology

## 2012-03-01 DIAGNOSIS — Z5309 Procedure and treatment not carried out because of other contraindication: Secondary | ICD-10-CM | POA: Insufficient documentation

## 2012-03-01 DIAGNOSIS — N949 Unspecified condition associated with female genital organs and menstrual cycle: Secondary | ICD-10-CM | POA: Insufficient documentation

## 2012-03-01 DIAGNOSIS — N938 Other specified abnormal uterine and vaginal bleeding: Secondary | ICD-10-CM | POA: Insufficient documentation

## 2012-03-01 DIAGNOSIS — N939 Abnormal uterine and vaginal bleeding, unspecified: Secondary | ICD-10-CM

## 2012-03-01 HISTORY — PX: NOVASURE ABLATION: SHX5394

## 2012-03-01 HISTORY — DX: Major depressive disorder, single episode, unspecified: F32.9

## 2012-03-01 HISTORY — DX: Depression, unspecified: F32.A

## 2012-03-01 LAB — CBC
HCT: 39.4 % (ref 36.0–46.0)
Hemoglobin: 12.8 g/dL (ref 12.0–15.0)
MCH: 28.8 pg (ref 26.0–34.0)
MCV: 88.7 fL (ref 78.0–100.0)
Platelets: 227 10*3/uL (ref 150–400)
RBC: 4.44 MIL/uL (ref 3.87–5.11)
WBC: 8 10*3/uL (ref 4.0–10.5)

## 2012-03-01 SURGERY — NOVASURE ABLATION
Anesthesia: General | Site: Uterus | Wound class: Clean Contaminated

## 2012-03-01 MED ORDER — LIDOCAINE HCL 1 % IJ SOLN
INTRAMUSCULAR | Status: DC | PRN
  Administered 2012-03-01: 10 mL

## 2012-03-01 MED ORDER — DEXAMETHASONE SODIUM PHOSPHATE 10 MG/ML IJ SOLN
INTRAMUSCULAR | Status: AC
  Filled 2012-03-01: qty 1

## 2012-03-01 MED ORDER — KETOROLAC TROMETHAMINE 30 MG/ML IJ SOLN: 15.0000 mg | Freq: Once | INTRAMUSCULAR | Status: DC | PRN

## 2012-03-01 MED ORDER — FENTANYL CITRATE 0.05 MG/ML IJ SOLN
25.0000 ug | INTRAMUSCULAR | Status: DC | PRN
  Administered 2012-03-01 (×2): 25 ug via INTRAVENOUS

## 2012-03-01 MED ORDER — ONDANSETRON HCL 4 MG/2ML IJ SOLN
INTRAMUSCULAR | Status: AC
  Filled 2012-03-01: qty 2

## 2012-03-01 MED ORDER — MIDAZOLAM HCL 5 MG/5ML IJ SOLN
INTRAMUSCULAR | Status: DC | PRN
  Administered 2012-03-01: 2 mg via INTRAVENOUS

## 2012-03-01 MED ORDER — DEXAMETHASONE SODIUM PHOSPHATE 10 MG/ML IJ SOLN
INTRAMUSCULAR | Status: DC | PRN
  Administered 2012-03-01: 10 mg via INTRAVENOUS

## 2012-03-01 MED ORDER — FENTANYL CITRATE 0.05 MG/ML IJ SOLN
INTRAMUSCULAR | Status: DC | PRN
  Administered 2012-03-01: 100 ug via INTRAVENOUS

## 2012-03-01 MED ORDER — LIDOCAINE HCL (CARDIAC) 20 MG/ML IV SOLN
INTRAVENOUS | Status: DC | PRN
  Administered 2012-03-01: 50 mg via INTRAVENOUS

## 2012-03-01 MED ORDER — PROPOFOL 10 MG/ML IV EMUL
INTRAVENOUS | Status: AC
  Filled 2012-03-01: qty 20

## 2012-03-01 MED ORDER — OXYCODONE-ACETAMINOPHEN 5-325 MG PO TABS: 2.0000 | ORAL_TABLET | Freq: Four times a day (QID) | ORAL | Status: AC | PRN

## 2012-03-01 MED ORDER — MIDAZOLAM HCL 2 MG/2ML IJ SOLN
INTRAMUSCULAR | Status: AC
  Filled 2012-03-01: qty 2

## 2012-03-01 MED ORDER — PROPOFOL 10 MG/ML IV EMUL
INTRAVENOUS | Status: DC | PRN
  Administered 2012-03-01 (×2): 20 mg via INTRAVENOUS

## 2012-03-01 MED ORDER — ONDANSETRON HCL 4 MG/2ML IJ SOLN
INTRAMUSCULAR | Status: DC | PRN
  Administered 2012-03-01: 4 mg via INTRAVENOUS

## 2012-03-01 MED ORDER — LACTATED RINGERS IV SOLN
INTRAVENOUS | Status: DC
  Administered 2012-03-01: 16:00:00 via INTRAVENOUS

## 2012-03-01 MED ORDER — FENTANYL CITRATE 0.05 MG/ML IJ SOLN
INTRAMUSCULAR | Status: AC
  Administered 2012-03-01: 25 ug via INTRAVENOUS
  Filled 2012-03-01: qty 2

## 2012-03-01 MED ORDER — FENTANYL CITRATE 0.05 MG/ML IJ SOLN
INTRAMUSCULAR | Status: AC
  Filled 2012-03-01: qty 2

## 2012-03-01 MED ORDER — LIDOCAINE HCL (CARDIAC) 20 MG/ML IV SOLN
INTRAVENOUS | Status: AC
  Filled 2012-03-01: qty 5

## 2012-03-01 SURGICAL SUPPLY — 13 items
ABLATOR ENDOMETRIAL BIPOLAR (ABLATOR) ×3 IMPLANT
CANISTER SUCTION 2500CC (MISCELLANEOUS) ×3 IMPLANT
CATH ROBINSON RED A/P 16FR (CATHETERS) ×3 IMPLANT
CLOTH BEACON ORANGE TIMEOUT ST (SAFETY) ×3 IMPLANT
CONTAINER PREFILL 10% NBF 60ML (FORM) ×3 IMPLANT
GAUZE VASELINE 3X9 (GAUZE/BANDAGES/DRESSINGS) ×3 IMPLANT
GLOVE BIO SURGEON STRL SZ 6.5 (GLOVE) ×6 IMPLANT
GOWN PREVENTION PLUS LG XLONG (DISPOSABLE) ×6 IMPLANT
GOWN STRL REIN XL XLG (GOWN DISPOSABLE) ×3 IMPLANT
NEEDLE SPNL 20GX3.5 QUINCKE YW (NEEDLE) ×3 IMPLANT
PACK HYSTEROSCOPY LF (CUSTOM PROCEDURE TRAY) ×3 IMPLANT
TOWEL OR 17X24 6PK STRL BLUE (TOWEL DISPOSABLE) ×6 IMPLANT
WATER STERILE IRR 1000ML POUR (IV SOLUTION) IMPLANT

## 2012-03-01 NOTE — Anesthesia Preprocedure Evaluation (Addendum)
Anesthesia Evaluation  Patient identified by MRN, date of birth, ID band Patient awake    Reviewed: Allergy & Precautions, H&P , NPO status , Patient's Chart, lab work & pertinent test results, reviewed documented beta blocker date and time   History of Anesthesia Complications (+) DIFFICULT IV STICK / SPECIAL LINE  Airway Mallampati: II TM Distance: >3 FB Neck ROM: full    Dental  (+)    Pulmonary neg pulmonary ROS,  breath sounds clear to auscultation  Pulmonary exam normal       Cardiovascular hypertension, On Medications Rhythm:regular Rate:Normal     Neuro/Psych PSYCHIATRIC DISORDERS (depression) negative neurological ROS     GI/Hepatic negative GI ROS, Neg liver ROS,   Endo/Other  Morbid obesity  Renal/GU negative Renal ROS  negative genitourinary   Musculoskeletal   Abdominal   Peds  Hematology negative hematology ROS (+) Blood dyscrasia (from AUB), anemia , Breast cancer 2012   Anesthesia Other Findings   Reproductive/Obstetrics negative OB ROS                          Anesthesia Physical Anesthesia Plan  ASA: III  Anesthesia Plan: General LMA   Post-op Pain Management:    Induction:   Airway Management Planned:   Additional Equipment:   Intra-op Plan:   Post-operative Plan:   Informed Consent: I have reviewed the patients History and Physical, chart, labs and discussed the procedure including the risks, benefits and alternatives for the proposed anesthesia with the patient or authorized representative who has indicated his/her understanding and acceptance.   Dental Advisory Given  Plan Discussed with: CRNA and Surgeon  Anesthesia Plan Comments:        Anesthesia Quick Evaluation

## 2012-03-01 NOTE — Transfer of Care (Signed)
Immediate Anesthesia Transfer of Care Note  Patient: Janet [name]  Procedure(s) Performed: Procedure(s) (LRB): NOVASURE ABLATION (N/A)  Patient Location: PACU  Anesthesia Type: General  Level of Consciousness: sedated  Airway & Oxygen Therapy: Patient Spontanous Breathing and Patient connected to nasal cannula oxygen  Post-op Assessment: Report given to PACU RN  Post vital signs: Reviewed and stable  Complications: No apparent anesthesia complications

## 2012-03-01 NOTE — Op Note (Signed)
Preoperative diagnosis: dysfunctional uterine bleeding  Postoperative diagnosis: dysfunctional uterine bleeding  Procedure: Attempted Novasure endometrial ablation.    Surgeon: Antionette Char A  Anesthesia: Managed anesthesia care  Estimated blood loss: 50 ml  Urine output: 300 ml  IV Fluids: per Anesthesiology   Complications: none  Specimen: N/A  Operative Findings: See below  Description of procedure:   The patient was taken to the operating room and placed on the operating table in the semi-lithotomy position in [place] stirrups.  Examination under anesthesia was performed.  The patient was prepped and draped in the usual manner.  After a time-out had been completed, a speculum was placed in the vagina.  The anterior lip of the cervix was grasped with a single-toothed tenaculum.  The endocervical canal sounded to 2 cm.  The uterine cavity sounded to 10 cm.  The endocervical canal was dilated with Shawnie Pons dilators.  A The cervical canal was further dilated with Pratt dilators to a #29.  The Novasure device was test-fired and the meter went between 4.5 and 5.  The device was withdrawn into the cylinder and placed into the uterus.  The dorsal fin was set appropriately.  The device was deployed and then seated.  The sleeve was placed up against the endocervix.  A cavity assessment test was performed and failed.  Several attempts were made to improve seal around the cervix.  A vaseline gauze was place around the device.  At this point the procedure was aborted  The device was removed.    All the instruments were removed from the vagina.  Final instrument counts were correct.  The patient was taken to the PACU in stable condition.

## 2012-03-01 NOTE — H&P (Signed)
  Chief Complaint: 51 y.o.  presents with AUB  Details of Present Illness: She is s/p a recent D&C, endometrial polyp removal; the polyp was benign.  There is a long h/o AUB.  She is not an optimal candidate for definitive surgery or hormonal management options.  Ht 5' 5.5" (1.664 m)  Wt 133.811 kg (295 lb)  BMI 48.34 kg/m2  LMP 11/27/2011  Past Medical History  Diagnosis Date  . Hypertension   . Anemia     history of anemia from AUB  . Umbilical hernia     currently - not repaired yet  . Breast cancer 2012    left breast  . Complication of anesthesia     difficult IV access- never needed PICC line  . Depression     no meds   History   Social History  . Marital Status: Single    Spouse Name: N/A    Number of Children: N/A  . Years of Education: N/A   Occupational History  . Not on file.   Social History Main Topics  . Smoking status: Never Smoker   . Smokeless tobacco: Never Used  . Alcohol Use: No  . Drug Use: No  . Sexually Active: No   Other Topics Concern  . Not on file   Social History Narrative  . No narrative on file   History reviewed. No pertinent family history.  Pertinent items are noted in HPI.  Pre-Op Diagnosis: Abnormal uterine bleeding   Planned Procedure: Procedure(s):  NOVASURE ABLATION  I have reviewed the patient's history and have completed the physical exam and Monzerat Handler is acceptable for surgery.  Roseanna Rainbow, MD 03/01/2012 12:10 PM

## 2012-03-04 ENCOUNTER — Encounter (HOSPITAL_COMMUNITY): Payer: Self-pay | Admitting: Obstetrics & Gynecology

## 2012-03-04 NOTE — Anesthesia Postprocedure Evaluation (Signed)
Anesthesia Post Note  Patient: Janet [name]  Procedure(s) Performed: Procedure(s) (LRB): NOVASURE ABLATION (N/A)  Anesthesia type: General  Patient location: PACU  Post pain: Pain level controlled  Post assessment: Post-op Vital signs reviewed  Last Vitals:  Filed Vitals:   03/01/12 1800  BP: 153/79  Pulse: 59  Temp: 36.3 C  Resp: 16    Post vital signs: Reviewed  Level of consciousness: sedated  Complications: No apparent anesthesia complications

## 2012-03-06 ENCOUNTER — Encounter (HOSPITAL_COMMUNITY): Payer: Self-pay | Admitting: Pharmacist

## 2012-03-07 ENCOUNTER — Encounter (HOSPITAL_COMMUNITY): Payer: Self-pay | Admitting: *Deleted

## 2012-03-15 ENCOUNTER — Ambulatory Visit (HOSPITAL_COMMUNITY)
Admission: RE | Admit: 2012-03-15 | Discharge: 2012-03-15 | Disposition: A | Discharge status: Home / Self Care | Payer: Medicaid Other | Source: Ambulatory Visit | Attending: Obstetrics & Gynecology | Admitting: Obstetrics & Gynecology

## 2012-03-15 ENCOUNTER — Ambulatory Visit (HOSPITAL_COMMUNITY): Payer: Medicaid Other | Admitting: Anesthesiology

## 2012-03-15 ENCOUNTER — Encounter (HOSPITAL_COMMUNITY)
Admission: RE | Disposition: A | Discharge status: Home / Self Care | Payer: Self-pay | Source: Ambulatory Visit | Attending: Obstetrics & Gynecology

## 2012-03-15 ENCOUNTER — Encounter (HOSPITAL_COMMUNITY): Payer: Self-pay | Admitting: Anesthesiology

## 2012-03-15 DIAGNOSIS — N840 Polyp of corpus uteri: Secondary | ICD-10-CM | POA: Insufficient documentation

## 2012-03-15 DIAGNOSIS — N949 Unspecified condition associated with female genital organs and menstrual cycle: Secondary | ICD-10-CM | POA: Insufficient documentation

## 2012-03-15 DIAGNOSIS — N939 Abnormal uterine and vaginal bleeding, unspecified: Secondary | ICD-10-CM

## 2012-03-15 DIAGNOSIS — N938 Other specified abnormal uterine and vaginal bleeding: Secondary | ICD-10-CM | POA: Insufficient documentation

## 2012-03-15 SURGERY — DILATATION & CURETTAGE/HYSTEROSCOPY WITH HYDROTHERMAL ABLATION

## 2012-03-15 MED ORDER — MEPERIDINE HCL 25 MG/ML IJ SOLN: 6.2500 mg | INTRAMUSCULAR | Status: DC | PRN

## 2012-03-15 MED ORDER — LACTATED RINGERS IV SOLN
INTRAVENOUS | Status: DC
  Administered 2012-03-15 (×2): via INTRAVENOUS

## 2012-03-15 MED ORDER — KETOROLAC TROMETHAMINE 30 MG/ML IJ SOLN: 15.0000 mg | Freq: Once | INTRAMUSCULAR | Status: DC | PRN

## 2012-03-15 MED ORDER — PROPOFOL 10 MG/ML IV EMUL
INTRAVENOUS | Status: AC
  Filled 2012-03-15: qty 20

## 2012-03-15 MED ORDER — FENTANYL CITRATE 0.05 MG/ML IJ SOLN
INTRAMUSCULAR | Status: DC | PRN
  Administered 2012-03-15: 100 ug via INTRAVENOUS

## 2012-03-15 MED ORDER — PROMETHAZINE HCL 25 MG/ML IJ SOLN: 6.2500 mg | INTRAMUSCULAR | Status: DC | PRN

## 2012-03-15 MED ORDER — SODIUM CHLORIDE 0.9 % IR SOLN
Status: DC | PRN
  Administered 2012-03-15: 3000 mL

## 2012-03-15 MED ORDER — LIDOCAINE HCL 2 % IJ SOLN
INTRAMUSCULAR | Status: AC
  Filled 2012-03-15: qty 1

## 2012-03-15 MED ORDER — KETOROLAC TROMETHAMINE 30 MG/ML IJ SOLN
INTRAMUSCULAR | Status: AC
  Filled 2012-03-15: qty 1

## 2012-03-15 MED ORDER — LIDOCAINE HCL 2 % IJ SOLN
INTRAMUSCULAR | Status: DC | PRN
  Administered 2012-03-15: 10 mL

## 2012-03-15 MED ORDER — ONDANSETRON HCL 4 MG/2ML IJ SOLN
INTRAMUSCULAR | Status: DC | PRN
  Administered 2012-03-15: 4 mg via INTRAVENOUS

## 2012-03-15 MED ORDER — HYDROMORPHONE HCL PF 1 MG/ML IJ SOLN
0.2500 mg | INTRAMUSCULAR | Status: DC | PRN
  Administered 2012-03-15 (×2): 0.5 mg via INTRAVENOUS

## 2012-03-15 MED ORDER — LIDOCAINE HCL (CARDIAC) 20 MG/ML IV SOLN
INTRAVENOUS | Status: DC | PRN
  Administered 2012-03-15: 80 mg via INTRAVENOUS

## 2012-03-15 MED ORDER — OXYCODONE-ACETAMINOPHEN 5-325 MG PO TABS: 2.0000 | ORAL_TABLET | Freq: Four times a day (QID) | ORAL | Status: AC | PRN

## 2012-03-15 MED ORDER — FENTANYL CITRATE 0.05 MG/ML IJ SOLN
INTRAMUSCULAR | Status: AC
  Filled 2012-03-15: qty 2

## 2012-03-15 MED ORDER — KETOROLAC TROMETHAMINE 30 MG/ML IJ SOLN
INTRAMUSCULAR | Status: DC | PRN
  Administered 2012-03-15: 30 mg via INTRAVENOUS

## 2012-03-15 MED ORDER — HYDROMORPHONE HCL PF 1 MG/ML IJ SOLN
INTRAMUSCULAR | Status: AC
  Administered 2012-03-15: 0.5 mg via INTRAVENOUS
  Filled 2012-03-15: qty 1

## 2012-03-15 MED ORDER — MIDAZOLAM HCL 2 MG/2ML IJ SOLN
INTRAMUSCULAR | Status: AC
  Filled 2012-03-15: qty 2

## 2012-03-15 MED ORDER — LIDOCAINE HCL (CARDIAC) 20 MG/ML IV SOLN
INTRAVENOUS | Status: AC
  Filled 2012-03-15: qty 5

## 2012-03-15 MED ORDER — ONDANSETRON HCL 4 MG/2ML IJ SOLN
INTRAMUSCULAR | Status: AC
  Filled 2012-03-15: qty 2

## 2012-03-15 MED ORDER — DEXAMETHASONE SODIUM PHOSPHATE 10 MG/ML IJ SOLN
INTRAMUSCULAR | Status: AC
  Filled 2012-03-15: qty 1

## 2012-03-15 MED ORDER — PROPOFOL 10 MG/ML IV EMUL
INTRAVENOUS | Status: DC | PRN
  Administered 2012-03-15: 200 mg via INTRAVENOUS

## 2012-03-15 MED ORDER — DEXAMETHASONE SODIUM PHOSPHATE 10 MG/ML IJ SOLN
INTRAMUSCULAR | Status: DC | PRN
  Administered 2012-03-15: 10 mg via INTRAVENOUS

## 2012-03-15 MED ORDER — MIDAZOLAM HCL 5 MG/5ML IJ SOLN
INTRAMUSCULAR | Status: DC | PRN
  Administered 2012-03-15: 2 mg via INTRAVENOUS

## 2012-03-15 SURGICAL SUPPLY — 16 items
CATH ROBINSON RED A/P 16FR (CATHETERS) ×2 IMPLANT
CLOTH BEACON ORANGE TIMEOUT ST (SAFETY) ×2 IMPLANT
CONTAINER PREFILL 10% NBF 60ML (FORM) ×2 IMPLANT
DRAPE HYSTEROSCOPY (DRAPE) ×2 IMPLANT
ELECT REM PT RETURN 9FT ADLT (ELECTROSURGICAL)
ELECTRODE REM PT RTRN 9FT ADLT (ELECTROSURGICAL) IMPLANT
GLOVE BIO SURGEON STRL SZ 6.5 (GLOVE) ×4 IMPLANT
GOWN PREVENTION PLUS LG XLONG (DISPOSABLE) ×4 IMPLANT
GOWN STRL REIN XL XLG (GOWN DISPOSABLE) ×2 IMPLANT
NEEDLE SPNL 22GX3.5 QUINCKE BK (NEEDLE) ×2 IMPLANT
NS IRRIG 1000ML POUR BTL (IV SOLUTION) ×2 IMPLANT
PACK VAGINAL MINOR WOMEN LF (CUSTOM PROCEDURE TRAY) ×2 IMPLANT
SET GENESYS HTA PROCERVA (MISCELLANEOUS) IMPLANT
SYR CONTROL 10ML LL (SYRINGE) ×2 IMPLANT
TOWEL OR 17X24 6PK STRL BLUE (TOWEL DISPOSABLE) ×4 IMPLANT
WATER STERILE IRR 1000ML POUR (IV SOLUTION) ×2 IMPLANT

## 2012-03-15 NOTE — Discharge Instructions (Signed)
Endometrial Ablation Endometrial ablation removes the lining of the uterus (endometrium). It is usually a same day, outpatient treatment. Ablation helps avoid major surgery (such as a hysterectomy). A hysterectomy is removal of the cervix and uterus. Endometrial ablation has less risk and complications, has a shorter recovery period and is less expensive. After endometrial ablation, most women will have little or no menstrual bleeding. You may not keep your fertility. Pregnancy is no longer likely after this procedure but if you are pre-menopausal, you still need to use a reliable method of birth control following the procedure because pregnancy can occur. REASONS TO HAVE THE PROCEDURE MAY INCLUDE:  Heavy periods.   Bleeding that is causing anemia.   Anovulatory bleeding, very irregular, bleeding.   Bleeding submucous fibroids (on the lining inside the uterus) if they are smaller than 3 centimeters.  REASONS NOT TO HAVE THE PROCEDURE MAY INCLUDE:  You wish to have more children.   You have a pre-cancerous or cancerous problem. The cause of any abnormal bleeding must be diagnosed before having the procedure.   You have pain coming from the uterus.   You have a submucus fibroid larger than 3 centimeters.   You recently had a baby.   You recently had an infection in the uterus.   You have a severe retro-flexed, tipped uterus and cannot insert the instrument to do the ablation.   You had a Cesarean section or deep major surgery on the uterus.   The inner cavity of the uterus is too large for the endometrial ablation instrument.  RISKS AND COMPLICATIONS   Perforation of the uterus.   Bleeding.   Infection of the uterus, bladder or vagina.   Injury to surrounding organs.   Cutting the cervix.   An air bubble to the lung (air embolus).   Pregnancy following the procedure.   Failure of the procedure to help the problem requiring hysterectomy.   Decreased ability to diagnose  cancer in the lining of the uterus.  BEFORE THE PROCEDURE  The lining of the uterus must be tested to make sure there is no pre-cancerous or cancer cells present.   Medications may be given to make the lining of the uterus thinner.   Ultrasound may be used to evaluate the size and look for abnormalities of the uterus.   Future pregnancy is not desired.  PROCEDURE  There are different ways to destroy the lining of the uterus.   Resectoscope - radio frequency-alternating electric current is the most common one used.   Cryotherapy - freezing the lining of the uterus.   Heated Free Liquid - heated salt (saline) solution inserted into the uterus.   Microwave - uses high energy microwaves in the uterus.   Thermal Balloon - a catheter with a balloon tip is inserted into the uterus and filled with heated fluid.  Your caregiver will talk with you about the method used in this clinic. They will also instruct you on the pros and cons of the procedure. Endometrial ablation is performed along with a procedure called operative hysteroscopy. A narrow viewing tube is inserted through the birth canal (vagina) and through the cervix into the uterus. A tiny camera attached to the viewing tube (hysteroscope) allows the uterine cavity to be shown on a TV monitor during surgery. Your uterus is filled with a harmless liquid to make the procedure easier. The lining of the uterus is then removed. The lining can also be removed with a resectoscope which allows your surgeon   to cut away the lining of the uterus under direct vision. Usually, you will be able to go home within an hour after the procedure. HOME CARE INSTRUCTIONS   Do not drive for 24 hours.   No tampons, douching or intercourse for 2 weeks or until your caregiver approves.   Rest at home for 24 to 48 hours. You may then resume normal activities unless told differently by your caregiver.   Take your temperature two times a day for 4 days, and record  it.   Take any medications your caregiver has ordered, as directed.   Use some form of contraception if you are pre-menopausal and do not want to get pregnant.  Bleeding after the procedure is normal. It varies from light spotting and mildly watery to bloody discharge for 4 to 6 weeks. You may also have mild cramping. Only take over-the-counter or prescription medicines for pain, discomfort, or fever as directed by your caregiver. Do not use aspirin, as this may aggravate bleeding. Frequent urination during the first 24 hours is normal. You will not know how effective your surgery is until at least 3 months after the surgery. SEEK IMMEDIATE MEDICAL CARE IF:   Bleeding is heavier than a normal menstrual cycle.   An oral temperature above 102 F (38.9 C) develops.   You have increasing cramps or pains not relieved with medication or develop belly (abdominal) pain which does not seem to be related to the same area of earlier cramping and pain.   You are light headed, weak or have fainting episodes.   You develop pain in the shoulder strap areas.   You have chest or leg pain.   You have abnormal vaginal discharge.   You have painful urination.  Document Released: 08/11/2004 Document Revised: 09/21/2011 Document Reviewed: 11/09/2007 [employer] Patient Information 2012 [employer]. 

## 2012-03-15 NOTE — Op Note (Signed)
Preoperative diagnosis: Abnormal uterine bleeding  Postoperative diagnosis: Same  Procedure: Diagnostic hysteroscopy, polypectomy, HTA endometrial ablation.    Surgeon: Antionette Char A  Anesthesia: LMA, paracervical block  Estimated blood loss: minimal  Urine output: 200 ml  IV Fluids: per Anesthesiology  Complications: none  Specimen: PATHOLOGY  Operative Findings: Dense synechiae in the cornu/fundus.  Tubal ostia not seen.  Polypoid tissue--5 mm x 2.  Otherwise the endometrium was thin.  Description of procedure:   The patient was taken to the operating room and placed on the operating table in the semi-lithotomy position in [place] stirrups.  Examination under anesthesia was performed.  The patient was prepped and draped in the usual manner.  After a time-out had been completed, a speculum was placed in the vagina.  The anterior lip of the cervix was grasped with a single-toothed tenaculum.    The endocervical canal was dilated with Shawnie Pons dilators.  A  diagnostic hysteroscope/HTA device with Glycine as the distending medium was used to perform a diagnostic hysteroscopy.  The device was removed and the polyps were grasped with a poly forceps.  The device was then reintroduced into the lower uterine segment.  The dorsal fin was adjusted to the tenaculum.  Throughout the procedure there was no significant loss of fluid detected.  The ablation cycle was completed under direct visualization.  The hysteroscope/HTA device was removed.    All the instruments were removed from the vagina.  Final instrument counts were correct.  The patient was taken to the PACU in stable condition.

## 2012-03-15 NOTE — H&P (Signed)
  Chief Complaint: 51 y.o.  who presents for an endometrial ablation  Details of Present Illness: Long h/o AUB.  S/P recent resection of a benign endometrial polyp and failed Novasure attempt.  Ht 5' 5.5" (1.664 m)  Wt 133.811 kg (295 lb)  BMI 48.34 kg/m2  Past Medical History  Diagnosis Date  . Hypertension   . Anemia     history of anemia from AUB  . Umbilical hernia     currently - not repaired yet  . Breast cancer 2012    left breast  . Complication of anesthesia     difficult IV access- never needed PICC line  . Depression     no meds   History   Social History  . Marital Status: Single    Spouse Name: N/A    Number of Children: N/A  . Years of Education: N/A   Occupational History  . Not on file.   Social History Main Topics  . Smoking status: Never Smoker   . Smokeless tobacco: Never Used  . Alcohol Use: No  . Drug Use: No  . Sexually Active: No   Other Topics Concern  . Not on file   Social History Narrative  . No narrative on file   History reviewed. No pertinent family history.  Pertinent items are noted in HPI.  Pre-Op Diagnosis: abnormal uterine bleeding   Planned Procedure: Procedure(s): HYSTEROSCOPY WITH HYDROTHERMAL ABLATION  I have reviewed the patient's history and have completed the physical exam and Janet [name] is acceptable for surgery.  Roseanna Rainbow, MD 03/15/2012 10:16 AM

## 2012-03-15 NOTE — Anesthesia Preprocedure Evaluation (Signed)
Anesthesia Evaluation  Patient identified by MRN, date of birth, ID band Patient awake    Reviewed: Allergy & Precautions, H&P , NPO status , Patient's Chart, lab work & pertinent test results  Airway Mallampati: I TM Distance: >3 FB Neck ROM: full    Dental  (+) Teeth Intact and Missing   Pulmonary neg pulmonary ROS,    Pulmonary exam normal       Cardiovascular hypertension, Pt. on medications Rhythm:regular Rate:Normal     Neuro/Psych PSYCHIATRIC DISORDERS Depression negative neurological ROS     GI/Hepatic negative GI ROS, Neg liver ROS,   Endo/Other  Morbid obesity  Renal/GU negative Renal ROS  negative genitourinary   Musculoskeletal negative musculoskeletal ROS (+)   Abdominal (+) + obese,   Peds negative pediatric ROS (+)  Hematology negative hematology ROS (+)   Anesthesia Other Findings   Reproductive/Obstetrics negative OB ROS                           Anesthesia Physical Anesthesia Plan  ASA: III  Anesthesia Plan: General   Post-op Pain Management:    Induction: Intravenous  Airway Management Planned: LMA  Additional Equipment:   Intra-op Plan:   Post-operative Plan:   Informed Consent: I have reviewed the patients History and Physical, chart, labs and discussed the procedure including the risks, benefits and alternatives for the proposed anesthesia with the patient or authorized representative who has indicated his/her understanding and acceptance.     Plan Discussed with: CRNA and Surgeon  Anesthesia Plan Comments:         Anesthesia Quick Evaluation

## 2012-03-15 NOTE — Transfer of Care (Signed)
Immediate Anesthesia Transfer of Care Note  Patient: Janet [name]  Procedure(s) Performed: Procedure(s) (LRB): DILATATION & CURETTAGE/HYSTEROSCOPY WITH HYDROTHERMAL ABLATION ()  Patient Location: PACU  Anesthesia Type: General  Level of Consciousness: awake  Airway & Oxygen Therapy: Patient Spontanous Breathing and Patient connected to nasal cannula oxygen  Post-op Assessment: Report given to PACU RN and Post -op Vital signs reviewed and stable  Post vital signs: stable  Complications: No apparent anesthesia complications

## 2012-03-15 NOTE — Anesthesia Postprocedure Evaluation (Signed)
Anesthesia Post Note  Patient: Janet [name]  Procedure(s) Performed: Procedure(s) (LRB): DILATATION & CURETTAGE/HYSTEROSCOPY WITH HYDROTHERMAL ABLATION ()  Anesthesia type: General  Patient location: PACU  Post pain: Pain level controlled  Post assessment: Post-op Vital signs reviewed  Last Vitals:  Filed Vitals:   03/15/12 1630  BP: 123/64  Pulse: 50  Temp:   Resp: 17    Post vital signs: Reviewed  Level of consciousness: sedated  Complications: No apparent anesthesia complicationsfj

## 2012-03-26 ENCOUNTER — Ambulatory Visit: Payer: Medicaid Other | Attending: Gynecologic Oncology | Admitting: Gynecologic Oncology

## 2012-03-26 ENCOUNTER — Encounter: Payer: Self-pay | Admitting: Gynecologic Oncology

## 2012-03-26 VITALS — BP 110/74 | HR 66 | Temp 98.4°F | Resp 20 | Ht 66.89 in | Wt 322.0 lb

## 2012-03-26 DIAGNOSIS — I10 Essential (primary) hypertension: Secondary | ICD-10-CM | POA: Insufficient documentation

## 2012-03-26 DIAGNOSIS — K439 Ventral hernia without obstruction or gangrene: Secondary | ICD-10-CM | POA: Insufficient documentation

## 2012-03-26 DIAGNOSIS — Z853 Personal history of malignant neoplasm of breast: Secondary | ICD-10-CM | POA: Insufficient documentation

## 2012-03-26 DIAGNOSIS — Z79899 Other long term (current) drug therapy: Secondary | ICD-10-CM | POA: Insufficient documentation

## 2012-03-26 DIAGNOSIS — N8502 Endometrial intraepithelial neoplasia [EIN]: Secondary | ICD-10-CM

## 2012-03-26 DIAGNOSIS — N879 Dysplasia of cervix uteri, unspecified: Secondary | ICD-10-CM | POA: Insufficient documentation

## 2012-03-26 NOTE — Patient Instructions (Signed)
Keep follow up appointment as scheduled.

## 2012-03-26 NOTE — Progress Notes (Signed)
Consult Note: Gyn-Onc  Janet [name] 51 y.o. female  CC:  Chief Complaint  Patient presents with  . complex hyperplasia with atypia    New consult    HPI: Patient is seen today in consultation at the request of Dr. Tamela Oddi for an endometrial biopsy revealing atypical complex hyperplasia.  Patient is a 51 year old gravida 1 para 0 who has a long history of dysfunctional uterine bleeding. Her history is quite complicated and dates back to December of 2011. 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 Estrogen and progesterone receptors were positive and was HER-2 positive. She had a complete left-sided mastectomy and has not completed her reconstructive surgery. She did not undergo any adjuvant therapy. Her course then complicated by dysfunctional uterine bleeding. She's required IV fluids for dehydration and has been prescribed Provera for cessation of bleeding which not help. She apparently had an endometrial biopsy in December of 2012 that was unremarkable. She had severe iron deficiency anemia from her dysfunctional bleeding and was on iron and most recently her hemoglobin was around the 12 range.  She became frustrated that her local providers were not doing anything with regards to bleeding was seen by Dr. Tamela Oddi. Dr. Tamela Oddi performed a attempted NovaSure endometrial ablation on May 17. At that time there was significant polypoid material so the ablation was not performed until a D&C to be done. The endometrium was inactive. There is no hyperplasia or carcinoma should a benign endometrial polyp. The patient then went back for at the ablation and at the time of hysteroscopy on May 31 was noted to have a small polyp. This was removed and the ablation was completed. 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 It is for this complex  hyperplasia with atypia that she is referred to [place] today.  Interval History:  Since her ablation she's been doing well she's had no bleeding. She has a watery discharge. She does have a large abdominal wall hernia feels that it is slowly been getting bigger. She has not been working and  she has had issues with uninsurability. Prior to her history of the breast cancer diagnosis in December 2011 she had not seen a physician in greater than 30 years. She denies a change in her bladder habits. She has intentionally lost weight from 370 pounds to 292 pounds in 3 years. Her weight is now up approximately 20 pounds since April 19 as her brother who was 75 years of age unexpectedly died of an unknown cause. This was her only sibling.  Review of Systems: She denies any nausea, vomiting, fevers, chills. She denies any unexpected weight loss or weight gain. She denies any headaches or visual changes. She has a clear discharge as above. She denies any change in her bowel or bladder habits. She is very worried about the timing of any surgical procedure as she is worried about having her Medicaid. 10 point review of systems is otherwise negative.  Current Meds:  Outpatient Encounter Prescriptions as of 03/26/2012  Medication Sig Dispense Refill  . diphenhydrAMINE (BENADRYL) 25 mg capsule Take 50 mg by mouth daily as needed. When she gets stung by red ants      . ferrous sulfate 324 (65 FE) MG TBEC Take 1 tablet by mouth 2 (two) times daily.      Marland Kitchen ibuprofen (ADVIL,MOTRIN) 200 MG tablet Take 200  mg by mouth 2 (two) times daily as needed. pain      . lisinopril-hydrochlorothiazide (PRINZIDE,ZESTORETIC) 20-12.5 MG per tablet Take 0.5 tablets by mouth daily as needed. Take 1/2 tablet by mouth daily as needed.  She checks her BP daily.      . Multiple Vitamin (MULTIVITAMIN) capsule Take 1 capsule by mouth daily.        Marland Kitchen oxyCODONE-acetaminophen (PERCOCET) 5-325 MG per tablet Take 2 tablets by mouth every 6 (six) hours  as needed for pain.  30 tablet  0    Allergy:  Allergies  Allergen Reactions  . Amoxicillin Other (See Comments)    Decreased heart rate  . Clarithromycin Other (See Comments)    Breaks mouth out  . Tape Dermatitis    Only can have paper tape    Social Hx:  She was married for 1988-1990. She believes she had one miscarriage. She is now working. She states that prior to the diagnosis of breast cancer she was getting ready to graduated from college with 2 degrees. History   Social History  . Marital Status: Single    Spouse Name: N/A    Number of Children: N/A  . Years of Education: N/A   Occupational History  . Not on file.   Social History Main Topics  . Smoking status: Never Smoker   . Smokeless tobacco: Never Used  . Alcohol Use: No  . Drug Use: No  . Sexually Active: No   Other Topics Concern  . Not on file   Social History Narrative  . No narrative on file    Past Surgical Hx:  Past Surgical History  Procedure Date  . Reconstruction breast immediate / delayed w/ tissue expander     tissue expander lt-9/12  . Toe osteotomy     rt toe  . Multiple extractions with alveoloplasty 10/23/2011    Procedure: MULTIPLE EXTRACION WITH ALVEOLOPLASTY;  Surgeon: Georgia Lopes;  Location: [facility];  Service: Oral Surgery;  Laterality: N/A;  . Breast surgery 2012    [facility]  . Breast surgery 2012    tissue expander lt  . Dilation and curettage of uterus 01/26/2012    Procedure: DILATATION AND CURETTAGE;  Surgeon: Antionette Char, MD;  Location: [facility];  Service: Gynecology;;  . Multiple tooth extractions   . Novasure ablation 03/01/2012    Procedure: NOVASURE ABLATION;  Surgeon: Antionette Char, MD;  Location: [facility];  Service: Gynecology;  Laterality: N/A;  Attempted novasure    Past Medical Hx:  Past Medical History  Diagnosis Date  . Hypertension   . Anemia     history of anemia from AUB  . Umbilical hernia     currently  - not repaired yet  . Breast cancer 2012    left breast  . Complication of anesthesia     difficult IV access- never needed PICC line  . Depression     no meds    Family Hx: She had 1 sibling a brother who died at the age of 59 of unknown causes. His death certificate states that he died from coronary disease and morbid obesity. There is no cancer history in the family.  Vitals:  Blood pressure 110/74, pulse 66, temperature 98.4 F (36.9 C), temperature source Oral, resp. rate 20, height 5' 6.89" (1.699 m), weight 322 lb (146.058 kg).  Physical Exam: Well-nourished well-developed female in no acute distress.  Neck: Supple, no lymphadenopathy, no thyromegaly.  Cardiovascular: Regular rate  and rhythm.  Lungs: Clear to auscultation bilaterally the breath sounds are distant.  Abdomen: Morbidly obese, there is a large approximate 20 cm periumbilical hernia. Abdomen is nontender. There is no obvious masses but exam is limited by habitus.  Groins: No skin breakdown no lymphadenopathy.  Extremities: Massive 3+ pitting and nonpitting edema equal bilaterally. 1+ distal pulses.  Pelvic: Normal external female genitalia. The vagina is atrophic. The cervix is visualized is a clear watery discharge is no gross visible lesions. Bimanual examination does not reveal the uterus to be enlarged but exam is limited by habitus. There is no nodularity. There is no adnexal masses  Assessment/Plan:  51 year old her personal history of breast cancer who now has complex hyperplasia with atypia. She also has a very large abdominal wall ventral hernia. While she will most likely not have any residual cancer this cannot be completely excluded based on the small focus of complex hyperplasia with atypia that was noted on her second D&C. The most definitive treatment would require a hysterectomy. In addition, in the setting of her premenopausal breast cancer I think it would be reasonable to proceed with a bilateral  salpingo-oophorectomy at that time. She is in agreement with this.   We would like to do this as a joint procedure with Gen. surgery so that hernia could repaired at the same time. She's not a candidate for vaginal hysterectomy and I do not believe she is a candidate for a minimally invasive procedure secondary to the large abdominal wall hernia. In brief we discussed the risks and benefits of surgery. She's wondering whether she should have her breast reconstruction completed before or after this and that is really her to decision. We will arrange for her to be seen by the surgical surgical group in [facility] to assess for a joint procedure to repair her hernia. If she wishes to proceed with her reconstructive breast surgery prior to that she understands any delay the surgery for this but that would be her prerogative. After we communicate with the general surgeons she will contact us and we will schedule her procedure accordingly. She will need to come back for preop visit prior to surgery.Tsering Leaman A., MD 03/26/2012, 2:09 PM

## 2012-03-28 ENCOUNTER — Encounter: Payer: Self-pay | Admitting: *Deleted

## 2012-03-28 ENCOUNTER — Other Ambulatory Visit: Payer: Self-pay | Admitting: *Deleted

## 2012-03-28 DIAGNOSIS — K469 Unspecified abdominal hernia without obstruction or gangrene: Secondary | ICD-10-CM

## 2012-03-28 NOTE — Progress Notes (Signed)
Arrangements made for pt to have CT and see Dr Ovidio Kin 04/05/12 for evaluation of hernia and consideration of combined procedure..  CT abd/pelvis w/contrast @ [facility] @1p ; Dr Ezzard Standing @ 3:30p.  These instructions as well as those for CT and contrast material are all mailed to the pt today.

## 2012-04-03 ENCOUNTER — Ambulatory Visit (HOSPITAL_BASED_OUTPATIENT_CLINIC_OR_DEPARTMENT_OTHER): Admission: RE | Admit: 2012-04-03 | Payer: Medicaid Other | Source: Ambulatory Visit | Admitting: Plastic Surgery

## 2012-04-03 ENCOUNTER — Encounter (HOSPITAL_BASED_OUTPATIENT_CLINIC_OR_DEPARTMENT_OTHER): Admission: RE | Payer: Self-pay | Source: Ambulatory Visit

## 2012-04-03 SURGERY — RECONSTRUCTION, BREAST
Anesthesia: General | Laterality: Right

## 2012-04-05 ENCOUNTER — Encounter (HOSPITAL_COMMUNITY): Payer: Self-pay

## 2012-04-05 ENCOUNTER — Ambulatory Visit (HOSPITAL_COMMUNITY)
Admission: RE | Admit: 2012-04-05 | Discharge: 2012-04-05 | Disposition: A | Discharge status: Home / Self Care | Payer: Medicaid Other | Source: Ambulatory Visit | Attending: Gynecologic Oncology | Admitting: Gynecologic Oncology

## 2012-04-05 ENCOUNTER — Ambulatory Visit (INDEPENDENT_AMBULATORY_CARE_PROVIDER_SITE_OTHER): Payer: Medicaid Other | Admitting: Surgery

## 2012-04-05 ENCOUNTER — Encounter (INDEPENDENT_AMBULATORY_CARE_PROVIDER_SITE_OTHER): Payer: Self-pay | Admitting: Surgery

## 2012-04-05 VITALS — HR 62 | Temp 98.4°F | Resp 20 | Ht 66.0 in | Wt 328.4 lb

## 2012-04-05 DIAGNOSIS — N926 Irregular menstruation, unspecified: Secondary | ICD-10-CM

## 2012-04-05 DIAGNOSIS — N939 Abnormal uterine and vaginal bleeding, unspecified: Secondary | ICD-10-CM

## 2012-04-05 DIAGNOSIS — K429 Umbilical hernia without obstruction or gangrene: Secondary | ICD-10-CM | POA: Insufficient documentation

## 2012-04-05 DIAGNOSIS — K469 Unspecified abdominal hernia without obstruction or gangrene: Secondary | ICD-10-CM

## 2012-04-05 DIAGNOSIS — K436 Other and unspecified ventral hernia with obstruction, without gangrene: Secondary | ICD-10-CM

## 2012-04-05 MED ORDER — IOHEXOL 300 MG/ML  SOLN
125.0000 mL | Freq: Once | INTRAMUSCULAR | Status: AC | PRN
  Administered 2012-04-05: 125 mL via INTRAVENOUS

## 2012-04-05 NOTE — Progress Notes (Signed)
Re:   Janet [name] DOB:   03/09/1961 MRN:   [id number]  ASSESSMENT AND PLAN: 1.  Ventral hernia/Umbilical hernia.  She has had a known abdominal wall hernia for at least 8 years.  I discussed the indications and complications of hernia surgery with the patient.  I discussed both the laparoscopic and open approach to hernia repair..  The potential risks of hernia surgery include, but are not limited to, bleeding, infection, open surgery, nerve injury, and recurrence of the hernia.  I provided the patient literature about hernia surgery.  Since a hysterectomy is a class 2 wound and I would plan to place permanent mesh, I would not do these operations at the same time.  Her ventral hernia repair would be amendable to laparoscopic repair, its just a matter of what is stuck in the hernia.  I will see her about 2 to 3 months after the hysterectomy.  2.  Endometrial atypical hyperplasia.  For hysterectomy by Dr. Cleda Mccreedy.  I left message with Kerby Moors.  3. Left breast cancer - 2011  Reconstruction - Dr. Rhona Leavens - 07/13/2011.  Surgeon - Dr. Mat Carne in [place].  Med Oncologist - Dr. Dory Larsen.  She said that her ER - 98%, PR - 93%.  She said that she still has two more breast operations.  She is on the [employer] (which covers her medical costs) for about another year. 3.  Hypertension. 4.  Depression. 5.  History of anemia.  Hgb - 12.8 - 03/01/2012.   Chief Complaint  Patient presents with  . Umbilical Hernia   REFERRING PHYSICIAN:  Dr. Cleda Mccreedy  HISTORY OF PRESENT ILLNESS: Janet [name] is a 51 y.o. (DOB: [birth year])  white female whose primary care physician is Dr. Kathrynn Humble (but he left unexpectedly, so she has no PCP) and comes to me today for long standing ventral hernia.  She says that I am the 17th doctor that she has seen in the last year.  The patient said her problems began over 6 months ago when she had some increased uterine bleeding and was  anemic.  She saw 2 gynecologist in [place] who tried to treat her bleeding. She tried Provera which did not help and in December 2012, she had an endometrial biopsy which was unremarkable.  With her persistent anemia and bleeding Dr. Shella Spearing suggested that she see Dr. Tamela Oddi. She said that she has undergone ablation 3 times. On the last ablation her pathology showed atypical hyperplasia she was referred to Dr. Rockney Ghee.  Dr. Rockney Ghee has discussed a hysterectomy with the patient. She also  has had a long-standing ventral hernia. 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 She thinks her hernia is unchanged over the last 2-3 years. She also thinks she has lost about 80 pounds of weight over the last 2-3 years.    Past Medical History  Diagnosis Date  . Hypertension   . Anemia     history of anemia from AUB  . Umbilical hernia     currently - not repaired yet  . Complication of anesthesia     difficult IV access- never needed PICC line  . Depression     no meds  . Breast cancer 2012    left breast      Past Surgical History  Procedure Date  . Reconstruction breast immediate / delayed w/ tissue expander  tissue expander lt-9/12  . Toe osteotomy     rt toe  . Multiple extractions with alveoloplasty 10/23/2011    Procedure: MULTIPLE EXTRACION WITH ALVEOLOPLASTY;  Surgeon: Georgia Lopes;  Location: [facility];  Service: Oral Surgery;  Laterality: N/A;  . Breast surgery 2012    [facility]  . Breast surgery 2012    tissue expander lt  . Dilation and curettage of uterus 01/26/2012    Procedure: DILATATION AND CURETTAGE;  Surgeon: Antionette Char, MD;  Location: [facility];  Service: Gynecology;;  . Multiple tooth extractions   . Novasure ablation 03/01/2012    Procedure: NOVASURE ABLATION;  Surgeon: Antionette Char, MD;  Location: [facility];  Service: Gynecology;  Laterality:  N/A;  Attempted novasure      Current Outpatient Prescriptions  Medication Sig Dispense Refill  . diphenhydrAMINE (BENADRYL) 25 mg capsule Take 50 mg by mouth daily as needed. When she gets stung by red ants      . ferrous sulfate 324 (65 FE) MG TBEC Take 1 tablet by mouth 2 (two) times daily.      Marland Kitchen ibuprofen (ADVIL,MOTRIN) 200 MG tablet Take 200 mg by mouth 2 (two) times daily as needed. pain      . lisinopril-hydrochlorothiazide (PRINZIDE,ZESTORETIC) 20-12.5 MG per tablet Take 0.5 tablets by mouth daily as needed. Take 1/2 tablet by mouth daily as needed.  She checks her BP daily.      . Multiple Vitamin (MULTIVITAMIN) capsule Take 1 capsule by mouth daily.         No current facility-administered medications for this visit.   Facility-Administered Medications Ordered in Other Visits  Medication Dose Route Frequency Provider Last Rate Last Dose  . iohexol (OMNIPAQUE) 300 MG/ML solution 125 mL  125 mL Intravenous Once PRN Medication Radiologist, MD   125 mL at 04/05/12 1343      Allergies  Allergen Reactions  . Amoxicillin Other (See Comments)    Decreased heart rate  . Clarithromycin Other (See Comments)    Breaks mouth out  . Tape Dermatitis    Only can have paper tape    REVIEW OF SYSTEMS: Skin:  No history of rash.  No history of abnormal moles. Infection:  No history of hepatitis or HIV.  No history of MRSA. Neurologic:  No history of stroke.  No history of seizure.  No history of headaches. Cardiac:  No history of hypertension. No history of heart disease.  Pulmonary:  Does not smoke cigarettes.  No asthma or bronchitis.  No OSA/CPAP. Breasts:  History of left sided breast cancer.  Endocrine:  No diabetes. No thyroid disease. Gastrointestinal:  No history of stomach disease.  No history of liver disease.  No history of gall bladder disease.  No history of pancreas disease.  No history of colon disease.  She says that she has lost about 80 pounds over the last 2 or 3  years intentionally. Urologic:  No history of kidney stones.  No history of bladder infections. Musculoskeletal:  No history of joint or back disease. Hematologic:  Has had anemia.  But her most recent Hgb looks okay. Psycho-social:  The patient is oriented.  History of depression.  Very talkative about her problems.  Her brother (who weighed around 400 pounds) died unexpectedly 02/07/12.  SOCIAL and FAMILY HISTORY: Divorced. No children Graduated from [employer] with a degree in accounting. But has not worked since 2008.  She worked as Catering manager and office person.  PHYSICAL EXAM: Pulse  62  Temp 98.4 F (36.9 C) (Temporal)  Resp 20  Ht 5\' 6"  (1.676 m)  Wt 328 lb 6 oz (148.95 kg)  BMI 53.00 kg/m2  General: Obese WF who is alert and generally healthy appearing.  HEENT: Normal. Pupils equal. Good dentition. Neck: Supple. No mass.  No thyroid mass. Lymph Nodes:  No supraclavicular or cervical nodes. Lungs: Clear to auscultation and symmetric breath sounds. Heart:  RRR. No murmur or rub. Breasts:  Left:  Breast reconstructed.  No mass.  Right:  Unremarkable.  Abdomen: Soft. Normal bowel sounds.  No abdominal scars.  Obese.  More pear than apple.  21 (CC) x 24 (transverse) cm mass mid abdomen c/w ventral/umbilical hernia.  It is not reducible. Rectal: Not done. Extremities:  Good strength and ROM  in upper and lower extremities. Neurologic:  Grossly intact to motor and sensory function. Psychiatric: Has normal mood and affect. Behavior is normal.   DATA REVIEWED: Notes from Dr. Duard Brady and CT scan.  Ovidio Kin, MD,  [facility] [address] [place]  [address]   [place], [place]    [address] Phone:  [phone] FAX:  [phone]

## 2012-04-08 ENCOUNTER — Encounter (HOSPITAL_COMMUNITY): Payer: Self-pay | Admitting: Pharmacy Technician

## 2012-04-09 ENCOUNTER — Telehealth (INDEPENDENT_AMBULATORY_CARE_PROVIDER_SITE_OTHER): Payer: Self-pay

## 2012-04-09 NOTE — Telephone Encounter (Signed)
The patient called because she has a lot of questions about her surgery.  She said she was told by Telford Nab that Dr Duard Brady could repair the hernia by just stitching it up.  I told her Dr Ezzard Standing did not recommend hernia repair without mesh due to risk of recurrence.  He will not do the surgery combined with mesh due to risk of infection.  If she has it repaired without mesh, she may need a 2nd surgery.  The patient is tentatively scheduled for 7/16 and Dr Duard Brady is going to be on vacation the week before.  The pt wants Dr Ezzard Standing to talk to Dr Duard Brady.  The # she gave me is [phone] for Harriett Sine.

## 2012-04-09 NOTE — Telephone Encounter (Signed)
Message copied by Ivory Broad on Tue Apr 09, 2012  4:38 PM ------      Message from: Marnette Burgess      Created: Tue Apr 09, 2012  2:00 PM      Contact: [phone]       Patient is needing a hysterectomy and the surgeon performing says she can fix the hernia but with no mesh, she would like Dr. Tomie China opinion, please call.

## 2012-04-26 ENCOUNTER — Encounter (HOSPITAL_COMMUNITY)
Admission: RE | Admit: 2012-04-26 | Discharge: 2012-04-26 | Disposition: A | Discharge status: Home / Self Care | Payer: Medicaid Other | Source: Ambulatory Visit | Attending: Gynecologic Oncology | Admitting: Gynecologic Oncology

## 2012-04-26 ENCOUNTER — Encounter (HOSPITAL_COMMUNITY): Payer: Self-pay

## 2012-04-26 ENCOUNTER — Inpatient Hospital Stay (HOSPITAL_COMMUNITY): Admission: RE | Admit: 2012-04-26 | Payer: Medicaid Other | Source: Ambulatory Visit

## 2012-04-26 ENCOUNTER — Ambulatory Visit (HOSPITAL_COMMUNITY)
Admission: RE | Admit: 2012-04-26 | Discharge: 2012-04-26 | Disposition: A | Discharge status: Home / Self Care | Payer: Medicaid Other | Source: Ambulatory Visit | Attending: Gynecologic Oncology | Admitting: Gynecologic Oncology

## 2012-04-26 DIAGNOSIS — C50919 Malignant neoplasm of unspecified site of unspecified female breast: Secondary | ICD-10-CM | POA: Insufficient documentation

## 2012-04-26 DIAGNOSIS — Z01812 Encounter for preprocedural laboratory examination: Secondary | ICD-10-CM | POA: Insufficient documentation

## 2012-04-26 DIAGNOSIS — Z01818 Encounter for other preprocedural examination: Secondary | ICD-10-CM | POA: Insufficient documentation

## 2012-04-26 HISTORY — DX: Pneumonia, unspecified organism: J18.9

## 2012-04-26 HISTORY — DX: Other specified postprocedural states: Z98.890

## 2012-04-26 HISTORY — DX: Nausea with vomiting, unspecified: R11.2

## 2012-04-26 HISTORY — DX: Localized edema: R60.0

## 2012-04-26 HISTORY — DX: Anxiety disorder, unspecified: F41.9

## 2012-04-26 LAB — DIFFERENTIAL
Basophils Absolute: 0.1 10*3/uL (ref 0.0–0.1)
Eosinophils Absolute: 0.3 10*3/uL (ref 0.0–0.7)
Lymphocytes Relative: 20 % (ref 12–46)
Neutro Abs: 5.5 10*3/uL (ref 1.7–7.7)
Neutrophils Relative %: 69 % (ref 43–77)

## 2012-04-26 LAB — COMPREHENSIVE METABOLIC PANEL
BUN: 12 mg/dL (ref 6–23)
CO2: 29 mEq/L (ref 19–32)
Chloride: 100 mEq/L (ref 96–112)
Creatinine, Ser: 0.78 mg/dL (ref 0.50–1.10)
GFR calc non Af Amer: >90 mL/min (ref >90)
Glucose, Bld: 94 mg/dL (ref 70–99)
Total Bilirubin: 0.3 mg/dL (ref 0.3–1.2)

## 2012-04-26 LAB — CBC
HCT: 41 % (ref 36.0–46.0)
MCV: 91.7 fL (ref 78.0–100.0)
RBC: 4.47 MIL/uL (ref 3.87–5.11)
WBC: 8 10*3/uL (ref 4.0–10.5)

## 2012-04-26 LAB — HCG, SERUM, QUALITATIVE: Preg, Serum: NEGATIVE

## 2012-04-26 NOTE — Patient Instructions (Signed)
21 Janet [name]  04/26/2012   Your procedure is scheduled on:  04/30/12   Tuesday   Surgery 1310-1540  Report to [facility] at  1040     AM.  Call this number if you have problems the morning of surgery: [phone]     Or PST   [id number]  [facility]   Remember:   Do not eat food:After Midnight. Sunday NIGHT OR AS DIRECTED BY OFFICE FOR BOWEL PREP--   CALL OFFICE Monday AM TO VERIFY IF BOWEL PREP IS NEEDED  May have clear liquids: all day Monday  ( OR AS DIRECTED BY OFFICE for prep)   INCREASE FLUIDS Monday AND DRINK UNTIL MIDNIGHT OR BEDTIME Monday NIGHT                  NOTHING AFTER MIDNIGHT Monday NIGHT  Clear liquids include soda, tea, black coffee, apple or grape juice, broth.  Take these medicines the morning of surgery with A SIP OF WATER:none   Do not wear jewelry, make-up or nail polish.  Do not wear lotions, powders, or perfumes. You may wear deodorant.  Do not shave 48 hours prior to surgery.  Do not bring valuables to the hospital.  Contacts, dentures or bridgework may not be worn into surgery.  Leave suitcase in the car. After surgery it may be brought to your room.  For patients admitted to the hospital, checkout time is 11:00 AM the day of discharge.   Patients discharged the day of surgeraunty will not be allowed to drive home.  Name and phone number of your driver:                                                                      Special Instructions: CHG Shower Use Special Wash: 1/2 bottle night before surgery and 1/2 bottle morning of surgery. REGULAR SOAP FACE AND PRIVATES              LADIES- NO SHAVING 48 HOURS BEFORE USING BETASEPT SOAP.              Please read over the following fact sheets that you were given: MRSA Information

## 2012-04-26 NOTE — Pre-Procedure Instructions (Signed)
Left voice mail with Damian Leavell- office closed for today. Notified them that patient would not sign consent until talks with MD regarding procedure and that she does not have instructions regarding bowel prep and will be asking about that

## 2012-04-29 ENCOUNTER — Other Ambulatory Visit: Payer: Self-pay | Admitting: Gynecologic Oncology

## 2012-04-29 ENCOUNTER — Encounter: Payer: Self-pay | Admitting: Gynecologic Oncology

## 2012-04-30 ENCOUNTER — Encounter (HOSPITAL_COMMUNITY): Payer: Self-pay | Admitting: Gynecologic Oncology

## 2012-04-30 ENCOUNTER — Encounter (HOSPITAL_COMMUNITY): Payer: Self-pay | Admitting: Certified Registered Nurse Anesthetist

## 2012-04-30 ENCOUNTER — Ambulatory Visit (HOSPITAL_COMMUNITY): Payer: Medicaid Other | Admitting: Certified Registered Nurse Anesthetist

## 2012-04-30 ENCOUNTER — Encounter (HOSPITAL_COMMUNITY)
Admission: RE | Disposition: A | Discharge status: Home / Self Care | Payer: Self-pay | Source: Ambulatory Visit | Attending: Obstetrics & Gynecology

## 2012-04-30 ENCOUNTER — Inpatient Hospital Stay (HOSPITAL_COMMUNITY)
Admission: RE | Admit: 2012-04-30 | Discharge: 2012-05-03 | DRG: 743 | Disposition: A | Discharge status: Home / Self Care | Payer: Medicaid Other | Source: Ambulatory Visit | Attending: Obstetrics & Gynecology | Admitting: Obstetrics & Gynecology

## 2012-04-30 DIAGNOSIS — I10 Essential (primary) hypertension: Secondary | ICD-10-CM | POA: Diagnosis present

## 2012-04-30 DIAGNOSIS — N8502 Endometrial intraepithelial neoplasia [EIN]: Secondary | ICD-10-CM | POA: Diagnosis present

## 2012-04-30 DIAGNOSIS — Z853 Personal history of malignant neoplasm of breast: Secondary | ICD-10-CM

## 2012-04-30 DIAGNOSIS — N939 Abnormal uterine and vaginal bleeding, unspecified: Secondary | ICD-10-CM

## 2012-04-30 DIAGNOSIS — K439 Ventral hernia without obstruction or gangrene: Secondary | ICD-10-CM | POA: Diagnosis present

## 2012-04-30 HISTORY — PX: VENTRAL HERNIA REPAIR: SHX424

## 2012-04-30 HISTORY — PX: ABDOMINAL HYSTERECTOMY: SHX81

## 2012-04-30 HISTORY — PX: LAPAROTOMY: SHX154

## 2012-04-30 HISTORY — PX: SALPINGOOPHORECTOMY: SHX82

## 2012-04-30 LAB — TYPE AND SCREEN: Antibody Screen: NEGATIVE

## 2012-04-30 SURGERY — LAPAROTOMY, EXPLORATORY
Anesthesia: General | Wound class: Clean Contaminated

## 2012-04-30 MED ORDER — PROPOFOL 10 MG/ML IV EMUL
INTRAVENOUS | Status: DC | PRN
  Administered 2012-04-30: 180 mg via INTRAVENOUS

## 2012-04-30 MED ORDER — DIPHENHYDRAMINE HCL 50 MG/ML IJ SOLN: 12.5000 mg | Freq: Four times a day (QID) | INTRAMUSCULAR | Status: DC | PRN

## 2012-04-30 MED ORDER — HYDROCHLOROTHIAZIDE 10 MG/ML ORAL SUSPENSION
6.2500 mg | Freq: Every day | ORAL | Status: DC
  Administered 2012-05-01 – 2012-05-03 (×3): 6.25 mg via ORAL
  Filled 2012-04-30 (×3): qty 1.25

## 2012-04-30 MED ORDER — NALOXONE HCL 0.4 MG/ML IJ SOLN: 0.4000 mg | INTRAMUSCULAR | Status: DC | PRN

## 2012-04-30 MED ORDER — OXYCODONE-ACETAMINOPHEN 5-325 MG PO TABS
1.0000 | ORAL_TABLET | ORAL | Status: DC | PRN
  Administered 2012-05-02 – 2012-05-03 (×3): 2 via ORAL
  Filled 2012-04-30 (×3): qty 2

## 2012-04-30 MED ORDER — ONDANSETRON HCL 4 MG/2ML IJ SOLN: 4.0000 mg | Freq: Four times a day (QID) | INTRAMUSCULAR | Status: DC | PRN

## 2012-04-30 MED ORDER — ACETAMINOPHEN 10 MG/ML IV SOLN
INTRAVENOUS | Status: DC | PRN
  Administered 2012-04-30: 1000 mg via INTRAVENOUS

## 2012-04-30 MED ORDER — ZOLPIDEM TARTRATE 5 MG PO TABS: 5.0000 mg | ORAL_TABLET | Freq: Every evening | ORAL | Status: DC | PRN

## 2012-04-30 MED ORDER — GLYCOPYRROLATE 0.2 MG/ML IJ SOLN
INTRAMUSCULAR | Status: DC | PRN
  Administered 2012-04-30: .8 mg via INTRAVENOUS

## 2012-04-30 MED ORDER — ACETAMINOPHEN 10 MG/ML IV SOLN
1000.0000 mg | Freq: Four times a day (QID) | INTRAVENOUS | Status: AC
  Administered 2012-04-30 – 2012-05-01 (×2): 1000 mg via INTRAVENOUS
  Filled 2012-04-30 (×2): qty 100

## 2012-04-30 MED ORDER — MIDAZOLAM HCL 5 MG/5ML IJ SOLN
INTRAMUSCULAR | Status: DC | PRN
  Administered 2012-04-30: 2 mg via INTRAVENOUS

## 2012-04-30 MED ORDER — FENTANYL CITRATE 0.05 MG/ML IJ SOLN
INTRAMUSCULAR | Status: DC | PRN
  Administered 2012-04-30 (×2): 50 ug via INTRAVENOUS
  Administered 2012-04-30: 100 ug via INTRAVENOUS

## 2012-04-30 MED ORDER — LACTATED RINGERS IV SOLN: INTRAVENOUS | Status: DC

## 2012-04-30 MED ORDER — HYDROMORPHONE HCL PF 1 MG/ML IJ SOLN
0.2500 mg | INTRAMUSCULAR | Status: DC | PRN
  Administered 2012-04-30 (×2): 0.5 mg via INTRAVENOUS

## 2012-04-30 MED ORDER — HYDROMORPHONE 0.3 MG/ML IV SOLN
INTRAVENOUS | Status: DC
  Administered 2012-04-30: 0.3 mg via INTRAVENOUS
  Administered 2012-05-01: 1.8 mg via INTRAVENOUS
  Administered 2012-05-01: 0.9 mg via INTRAVENOUS
  Administered 2012-05-01: 22:00:00 via INTRAVENOUS
  Administered 2012-05-01: 0.6 mg via INTRAVENOUS
  Administered 2012-05-01: 5.1 mg via INTRAVENOUS
  Administered 2012-05-01: 0.9 mg via INTRAVENOUS
  Administered 2012-05-01: 0.6 mg via INTRAVENOUS
  Administered 2012-05-02: 1.2 mg via INTRAVENOUS
  Administered 2012-05-02 (×2): 0.6 mg via INTRAVENOUS
  Filled 2012-04-30: qty 25

## 2012-04-30 MED ORDER — MAGNESIUM HYDROXIDE 400 MG/5ML PO SUSP
30.0000 mL | Freq: Three times a day (TID) | ORAL | Status: AC
  Administered 2012-05-01: 30 mL via ORAL
  Filled 2012-04-30: qty 30

## 2012-04-30 MED ORDER — HYDROMORPHONE HCL PF 1 MG/ML IJ SOLN
INTRAMUSCULAR | Status: DC | PRN
  Administered 2012-04-30 (×4): 0.5 mg via INTRAVENOUS

## 2012-04-30 MED ORDER — CIPROFLOXACIN IN D5W 400 MG/200ML IV SOLN
INTRAVENOUS | Status: AC
  Filled 2012-04-30: qty 200

## 2012-04-30 MED ORDER — BUPIVACAINE LIPOSOME 1.3 % IJ SUSP
20.0000 mL | Freq: Once | INTRAMUSCULAR | Status: DC
  Filled 2012-04-30: qty 20

## 2012-04-30 MED ORDER — HYDROMORPHONE HCL PF 1 MG/ML IJ SOLN
INTRAMUSCULAR | Status: AC
  Filled 2012-04-30: qty 1

## 2012-04-30 MED ORDER — DIPHENHYDRAMINE HCL 12.5 MG/5ML PO ELIX
12.5000 mg | ORAL_SOLUTION | Freq: Four times a day (QID) | ORAL | Status: DC | PRN
  Filled 2012-04-30: qty 5

## 2012-04-30 MED ORDER — BUPIVACAINE LIPOSOME 1.3 % IJ SUSP
INTRAMUSCULAR | Status: DC | PRN
  Administered 2012-04-30: 40 mL

## 2012-04-30 MED ORDER — HYDROMORPHONE 0.3 MG/ML IV SOLN
INTRAVENOUS | Status: AC
  Filled 2012-04-30: qty 25

## 2012-04-30 MED ORDER — ONDANSETRON HCL 4 MG PO TABS: 4.0000 mg | ORAL_TABLET | Freq: Four times a day (QID) | ORAL | Status: DC | PRN

## 2012-04-30 MED ORDER — ROCURONIUM BROMIDE 100 MG/10ML IV SOLN
INTRAVENOUS | Status: DC | PRN
  Administered 2012-04-30: 10 mg via INTRAVENOUS
  Administered 2012-04-30: 50 mg via INTRAVENOUS
  Administered 2012-04-30: 10 mg via INTRAVENOUS

## 2012-04-30 MED ORDER — NEOSTIGMINE METHYLSULFATE 1 MG/ML IJ SOLN
INTRAMUSCULAR | Status: DC | PRN
  Administered 2012-04-30: 5 mg via INTRAVENOUS

## 2012-04-30 MED ORDER — SODIUM CHLORIDE 0.9 % IJ SOLN: 9.0000 mL | INTRAMUSCULAR | Status: DC | PRN

## 2012-04-30 MED ORDER — METRONIDAZOLE IN NACL 5-0.79 MG/ML-% IV SOLN
500.0000 mg | INTRAVENOUS | Status: AC
  Administered 2012-04-30: 500 mg via INTRAVENOUS

## 2012-04-30 MED ORDER — ACETAMINOPHEN 10 MG/ML IV SOLN
INTRAVENOUS | Status: AC
  Filled 2012-04-30: qty 100

## 2012-04-30 MED ORDER — LACTATED RINGERS IV SOLN
INTRAVENOUS | Status: DC | PRN
  Administered 2012-04-30 (×2): via INTRAVENOUS

## 2012-04-30 MED ORDER — PROMETHAZINE HCL 25 MG/ML IJ SOLN: 6.2500 mg | INTRAMUSCULAR | Status: DC | PRN

## 2012-04-30 MED ORDER — LIDOCAINE HCL (CARDIAC) 20 MG/ML IV SOLN
INTRAVENOUS | Status: DC | PRN
  Administered 2012-04-30: 100 mg via INTRAVENOUS

## 2012-04-30 MED ORDER — DEXAMETHASONE SODIUM PHOSPHATE 10 MG/ML IJ SOLN
INTRAMUSCULAR | Status: DC | PRN
  Administered 2012-04-30: 10 mg via INTRAVENOUS

## 2012-04-30 MED ORDER — LISINOPRIL 10 MG PO TABS
10.0000 mg | ORAL_TABLET | Freq: Every day | ORAL | Status: DC
  Administered 2012-05-01 – 2012-05-03 (×3): 10 mg via ORAL
  Filled 2012-04-30 (×3): qty 1

## 2012-04-30 MED ORDER — LISINOPRIL-HYDROCHLOROTHIAZIDE 20-12.5 MG PO TABS: 0.5000 | ORAL_TABLET | Freq: Every morning | ORAL | Status: DC

## 2012-04-30 MED ORDER — CIPROFLOXACIN IN D5W 400 MG/200ML IV SOLN
400.0000 mg | INTRAVENOUS | Status: AC
  Administered 2012-04-30: 400 mg via INTRAVENOUS

## 2012-04-30 MED ORDER — ONDANSETRON HCL 4 MG/2ML IJ SOLN
INTRAMUSCULAR | Status: DC | PRN
  Administered 2012-04-30: 4 mg via INTRAVENOUS

## 2012-04-30 MED ORDER — ONDANSETRON HCL 4 MG/2ML IJ SOLN
4.0000 mg | Freq: Four times a day (QID) | INTRAMUSCULAR | Status: DC | PRN
  Administered 2012-04-30: 4 mg via INTRAVENOUS
  Filled 2012-04-30: qty 2

## 2012-04-30 MED ORDER — 0.9 % SODIUM CHLORIDE (POUR BTL) OPTIME
TOPICAL | Status: DC | PRN
  Administered 2012-04-30: 2000 mL

## 2012-04-30 MED ORDER — METRONIDAZOLE IN NACL 5-0.79 MG/ML-% IV SOLN
INTRAVENOUS | Status: AC
  Filled 2012-04-30: qty 100

## 2012-04-30 MED ORDER — KCL IN DEXTROSE-NACL 20-5-0.45 MEQ/L-%-% IV SOLN
INTRAVENOUS | Status: DC
  Administered 2012-04-30 – 2012-05-01 (×4): via INTRAVENOUS
  Filled 2012-04-30 (×7): qty 1000

## 2012-04-30 SURGICAL SUPPLY — 40 items
ATTRACTOMAT 16X20 MAGNETIC DRP (DRAPES) ×3 IMPLANT
BAG URINE DRAINAGE (UROLOGICAL SUPPLIES) IMPLANT
BINDER ABD UNIV 12 45-62 (WOUND CARE) ×2 IMPLANT
BINDER ABDOMINAL 46IN 62IN (WOUND CARE) ×3
BLADE EXTENDED COATED 6.5IN (ELECTRODE) ×3 IMPLANT
CANISTER SUCTION 2500CC (MISCELLANEOUS) ×3 IMPLANT
CATH FOLEY 2WAY SLVR  5CC 16FR (CATHETERS)
CATH FOLEY 2WAY SLVR 5CC 16FR (CATHETERS) IMPLANT
CLIP TI MEDIUM LARGE 6 (CLIP) ×3 IMPLANT
CLOTH BEACON ORANGE TIMEOUT ST (SAFETY) ×3 IMPLANT
DRAPE TABLE BACK 44X90 PK DISP (DRAPES) ×6 IMPLANT
DRAPE WARM FLUID 44X44 (DRAPE) ×3 IMPLANT
DRSG TELFA 4X14 ISLAND ADH (GAUZE/BANDAGES/DRESSINGS) ×3 IMPLANT
ELECT REM PT RETURN 9FT ADLT (ELECTROSURGICAL) ×3
ELECTRODE REM PT RTRN 9FT ADLT (ELECTROSURGICAL) ×2 IMPLANT
GAUZE SPONGE 4X4 16PLY XRAY LF (GAUZE/BANDAGES/DRESSINGS) IMPLANT
GLOVE BIO SURGEON STRL SZ 6.5 (GLOVE) ×3 IMPLANT
GLOVE BIO SURGEON STRL SZ7.5 (GLOVE) ×6 IMPLANT
GLOVE BIOGEL PI IND STRL 7.0 (GLOVE) ×2 IMPLANT
GLOVE BIOGEL PI INDICATOR 7.0 (GLOVE) ×1
GOWN PREVENTION PLUS XLARGE (GOWN DISPOSABLE) ×3 IMPLANT
GOWN STRL NON-REIN LRG LVL3 (GOWN DISPOSABLE) ×12 IMPLANT
HOLDER FOLEY CATH W/STRAP (MISCELLANEOUS) ×3 IMPLANT
KIT POWER CATH 8FR (Catheter) ×3 IMPLANT
NEEDLE HYPO 25X1 1.5 SAFETY (NEEDLE) ×3 IMPLANT
NS IRRIG 1000ML POUR BTL (IV SOLUTION) ×6 IMPLANT
PACK ABDOMINAL WL (CUSTOM PROCEDURE TRAY) ×3 IMPLANT
SHEET LAVH (DRAPES) ×3 IMPLANT
SPONGE LAP 18X18 X RAY DECT (DISPOSABLE) ×3 IMPLANT
STAPLER VISISTAT 35W (STAPLE) ×3 IMPLANT
SUT PDS AB 1 CTXB1 36 (SUTURE) ×6 IMPLANT
SUT PROLENE 1 CTX 30  8455H (SUTURE) ×5
SUT PROLENE 1 CTX 30 8455H (SUTURE) ×10 IMPLANT
SUT VIC AB 0 CT1 36 (SUTURE) ×33 IMPLANT
SUT VIC AB 2-0 CT2 27 (SUTURE) IMPLANT
SUT VICRYL 2 0 18  UND BR (SUTURE) ×1
SUT VICRYL 2 0 18 UND BR (SUTURE) ×2 IMPLANT
SYR CONTROL 10ML LL (SYRINGE) ×3 IMPLANT
TOWEL OR 17X26 10 PK STRL BLUE (TOWEL DISPOSABLE) ×3 IMPLANT
TRAY FOLEY CATH 14FRSI W/METER (CATHETERS) ×3 IMPLANT

## 2012-04-30 NOTE — Op Note (Signed)
PATIENT: Janet [name] DATE OF BIRTH: 19-Apr-1961 ENCOUNTER DATE: 04/30/2012   Preop Diagnosis: Complex hyperplasia with atypia, large 18 cm ventral hernia  Postoperative Diagnosis: same.   Surgery: Total abdominal hysterectomy bilateral salpingo-oophorectomy, hernia repair  Surgeons:  Rejeana Brock A. Duard Brady, MD; Antionette Char, MD   Assistant: Telford Nab   Anesthesia: General   Estimated blood loss: 200 ml   IVF: 1200 ml   Urine output: 200 ml   Complications: None   Pathology: Uterus, cervix, bilateral tubes and ovaries, hernia sac  Operative findings: 18 cm ventral hernia with transverse colon and omentum.  10 week size uterus, Frozen section c/w s/p ablation, no evidence of malignancy.  Procedure: The patient was identified in the preoperative holding area. Informed consent was signed on the chart. Patient was seen history was reviewed and exam was performed.   The patient was then taken to the operating room and placed in the supine position with SCD hose on. General anesthesia was then induced without difficulty. She was then placed in the dorsolithotomy position. The perineum was usual fashion and Foley catheter was inserted into the bladder under sterile conditions.   The abdomen was prepped with 2 chlor prep sponges per protocol. After the prep was dried she was then draped in usual fashion.  Timeout was performed to confirm the patient, procedure, antibiotic, allergy, PAS status. A vertical midline incision was made with a knife. Immediately under the skin was very little subcutaneous tissues the hernia sac was identified. We dissected down to the fascia in the area not involving the hernia sac. The fascia was cleared of the subcutaneous tissues. The fascia was scored in the midline and extended inferiorly towards the pubic symphysis. The fascial edges were grasped with Coker clamps elevated. This allowed the fascial incision to be extended to the level of the hernia sac.  We then entered the peritoneum with visualization of the underlying peritoneal cavity. Surgeons fingers replaced between the bowel and. The hernia sac was dissected. The transverse colon omentum were then placed into the intra-abdominal position. The hernia sac with it was excised in the fascia was cleared to fresh fascial edges.  The Buchwalter self-retaining retractor was placed in the bed. Retractor blazer placed for lateral displacement of the abdominal wall. No pressure with this significant pressure on the psoas bellies. Patient was then placed in mild Trendelenburg position and the large and small bowel were packed out of the way with moist laparotomy sponges. The round ligament on the patient's right side was transected with monopolar cautery. The anterior and posterior leaves of the broad ligament were then taken down in the usual fashion. The ureter was identified on the medial leaf of the broad ligament. A window was made between the IP and the ureter. The IP was clamped with 2 Anderson clamps transected and suture ligated. The uterine vessels were then skeletonized and clamped with a curved Masterson. The uterine vessels are transected and suture ligated. We continued down the cardinal ligament after assuring that the bladder flap was adequately created to the level of the cervicovaginal junction. The same procedure was performed on the patient's left side. We then came across the cervicovaginal junction with curved Masterson clamps. The cervix was amputated from the vagina on the patient's right side and suture ligated. The same procedure was performed on the left side allowing other colpotomy to be completed. The uterus cervix fallopian tubes and ovaries were sent for frozen section. The vaginal cuff was closed using figure-of-eight sutures  of 0 Vicryl.  The abdomen and pelvis were copiously irrigated and noted to be hemostatic. The laparotomy sponges were removed. The Buchwalter retractors removed  from the patient's abdomen. Laparotomy measurement count was correct. 0 Prolene was used in a running mass closure fashion to close the fascia. At this point frozen section returned revealing no evidence of any malignancy.  The fascial incision was completely closed. The subcutaneous tissues were be hemostatic and irrigated. Expert was used for postoperative pain control. The subcutaneous tissues were reapproximated with interrupted suture of 0 Vicryl. The skin was closed using staples.   All instrument, needle, laparotomy sponges and Ray-Tec counts were correct x2. The patient tolerated the procedure well and was taken to the recovery room in stable condition. This is Cleda Mccreedy dictating an operative note on patient Janet [name].

## 2012-04-30 NOTE — Anesthesia Postprocedure Evaluation (Signed)
Anesthesia Post Note  Patient: Janet [name]  Procedure(s) Performed: Procedure(s) (LRB): EXPLORATORY LAPAROTOMY (N/A) HYSTERECTOMY ABDOMINAL (N/A) HERNIA REPAIR VENTRAL ADULT (N/A) SALPINGO OOPHERECTOMY (Bilateral)  Anesthesia type: General  Patient location: PACU  Post pain: Pain level controlled  Post assessment: Post-op Vital signs reviewed  Last Vitals:  Filed Vitals:   04/30/12 1700  BP: 165/85  Pulse: 54  Temp: 36.5 C  Resp: 14    Post vital signs: Reviewed  Level of consciousness: sedated  Complications: No apparent anesthesia complications

## 2012-04-30 NOTE — H&P (Signed)
H&P  Janet [name] 51 y.o. female  CC: No chief complaint on file. Patient is a 51 year old gravida 1 para 0 who has a long history of dysfunctional uterine bleeding. Her history is quite complicated and dates back to December of 2011. 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 Estrogen and progesterone receptors were positive and was HER-2 positive. She had a complete left-sided mastectomy and has not completed her reconstructive surgery. She did not undergo any adjuvant therapy. Her course then complicated by dysfunctional uterine bleeding. She's required IV fluids for dehydration and has been prescribed Provera for cessation of bleeding which not help. She apparently had an endometrial biopsy in December of 2012 that was unremarkable. She had severe iron deficiency anemia from her dysfunctional bleeding and was on iron and most recently her hemoglobin was around the 12 range.  She became frustrated that her local providers were not doing anything with regards to bleeding was seen by Dr. Tamela Oddi. Dr. Tamela Oddi performed a attempted NovaSure endometrial ablation on May 17. At that time there was significant polypoid material so the ablation was not performed until a D&C to be done. The endometrium was inactive. There is no hyperplasia or carcinoma should a benign endometrial polyp. The patient then went back for at the ablation and at the time of hysteroscopy on May 31 was noted to have a small polyp. This was removed and the ablation was completed. 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 It is for this complex hyperplasia with atypia that she is referred to [place] today.   Interval History:  Since her ablation she's been doing well she's had no bleeding. She has a watery discharge. She does have a large abdominal wall hernia feels that it is slowly  been getting bigger. She has not been working and she has had issues with uninsurability. Prior to her history of the breast cancer diagnosis in December 2011 she had not seen a physician in greater than 30 years. She denies a change in her bladder habits. She has intentionally lost weight from 370 pounds to 292 pounds in 3 years. Her weight is now up approximately 20 pounds since April 19 as her brother who was 21 years of age unexpectedly died of an unknown cause. This was her only sibling.   Review of Systems:  She denies any nausea, vomiting, fevers, chills. She denies any unexpected weight loss or weight gain. She denies any headaches or visual changes. She has a clear discharge as above. She denies any change in her bowel or bladder habits. She is very worried about the timing of any surgical procedure as she is worried about having her Medicaid. 10 point review of systems is otherwise negative.    Past Medical Hx:  Past Medical History  Diagnosis Date  . Anemia     history of anemia from AUB  . Umbilical hernia     currently - not repaired yet  . Complication of anesthesia     difficult IV access- never needed PICC line  . Depression     no meds  . PONV (postoperative nausea and vomiting)   . Anxiety   . Pneumonia 1989  . Breast cancer 2012    left breast/  NO CHEMO  . Edema of both legs   . Hypertension     ekg 1/13 on chart  Past Surgical Hx:  Past Surgical History  Procedure Date  . Reconstruction breast immediate / delayed w/ tissue expander     tissue expander lt-9/12  . Toe osteotomy     rt toe  . Multiple extractions with alveoloplasty 10/23/2011  . Dilation and curettage of uterus 01/26/2012    Procedure: DILATATION AND CURETTAGE;  Surgeon: Antionette Char, MD;  Location: [facility];  Service: Gynecology;;  . Multiple tooth extractions   . Novasure ablation 03/01/2012  . Breast surgery 2012    lt mastectomy/ axillary dissection  . Breast surgery 2012    tissue  expander lt  . Colonoscopy   . Esophagogastroduodenoscopy    Current Meds: @[facility] @ Allergy:  Allergies  Allergen Reactions  . Amoxicillin Other (See Comments)    Decreased heart rate  . Clarithromycin Other (See Comments)    Breaks mouth out  . Ivp Dye (Iodinated Diagnostic Agents) Other (See Comments)    States made bp systolic has increased and diastolic has decreased and hasnt felt well x 3 weeks since dye adm  . Tape Dermatitis    Only can have paper tape   Social Hx:   History   Social History  . Marital Status: Single    Spouse Name: N/A    Number of Children: N/A  . Years of Education: N/A   Occupational History  . Not on file.   Social History Main Topics  . Smoking status: Never Smoker   . Smokeless tobacco: Never Used  . Alcohol Use: No  . Drug Use: No  . Sexually Active: No   Other Topics Concern  . Not on file   Social History Narrative  . No narrative on file    Physical Exam: From 6/13 Well-nourished well-developed female in no acute distress.  Neck: Supple, no lymphadenopathy, no thyromegaly.  Cardiovascular: Regular rate and rhythm.  Lungs: Clear to auscultation bilaterally the breath sounds are distant.  Abdomen: Morbidly obese, there is a large approximate 20 cm periumbilical hernia. Abdomen is nontender. There is no obvious masses but exam is limited by habitus.  Groins: No skin breakdown no lymphadenopathy.  Extremities: Massive 3+ pitting and nonpitting edema equal bilaterally. 1+ distal pulses.  Pelvic: Normal external female genitalia. The vagina is atrophic. The cervix is visualized is a clear watery discharge is no gross visible lesions. Bimanual examination does not reveal the uterus to be enlarged but exam is limited by habitus. There is no nodularity. There is no adnexal masses   Assessment/Plan:  51 year old her personal history of breast cancer who now has complex hyperplasia with atypia. She also has a very large abdominal wall  ventral hernia. While she will most likely not have any residual cancer this cannot be completely excluded based on the small focus of complex hyperplasia with atypia that was noted on her second D&C. The most definitive treatment would require a hysterectomy. In addition, in the setting of her premenopausal breast cancer I think it would be reasonable to proceed with a bilateral salpingo-oophorectomy at that time. She is in agreement with this.   We would like to do this as a joint procedure with Gen. surgery so that hernia could repaired at the same time. She's not a candidate for vaginal hysterectomy and I do not believe she is a candidate for a minimally invasive procedure secondary to the large abdominal wall hernia. In brief we discussed the risks and benefits of surgery. She had a CT scan and evaluation by Dr. Ezzard Standing who  felt that it was best to not proceed with hernia repair at the same time due to the need to place mesh.  I have spoken with the patient regarding this and we will try to repair the hernia the best that we can at the time of her surgery understanding that she may have a recurrence of her hernia and require additional surgery. She is in agreement with this.   Yoshiaki Kreuser A., MD 04/30/2012, 6:38 AM

## 2012-04-30 NOTE — Progress Notes (Signed)
Report given to Jacki Cones, R.N.- FOR CONTINUED PACU CARE

## 2012-04-30 NOTE — Interval H&P Note (Signed)
History and Physical Interval Note:  04/30/2012 12:12 PM  Janet [name]  has presented today for surgery, with the diagnosis of complex hyperplasia  The various methods of treatment have been discussed with the patient and family. After consideration of risks, benefits and other options for treatment, the patient has consented to  Procedure(s) (LRB): EXPLORATORY LAPAROTOMY (N/A) HYSTERECTOMY ABDOMINAL (N/A) BILATERAL SALPINGECTOMY (N/A) as a surgical intervention .  The patient's history has been reviewed, patient examined, no change in status, stable for surgery.  I have reviewed the patients' chart and labs.  Questions were answered to the patient's satisfaction.  She again understands that while we will attempt to repair her ventral hernia today, that she may require additional surgery in the future for this hernia.  It was deemed best to not proceed with hernia repair with mesh at this time due to the contaminated nature of the procedure and entering the vagina.   Keamber Macfadden A.

## 2012-04-30 NOTE — Transfer of Care (Signed)
Immediate Anesthesia Transfer of Care Note  Patient: GRACI HULCE  Procedure(s) Performed: Procedure(s) (LRB): EXPLORATORY LAPAROTOMY (N/A) HYSTERECTOMY ABDOMINAL (N/A) HERNIA REPAIR VENTRAL ADULT (N/A) SALPINGO OOPHERECTOMY (Bilateral)  Patient Location: PACU  Anesthesia Type: General  Level of Consciousness: awake, alert  and oriented  Airway & Oxygen Therapy: Patient Spontanous Breathing and Patient connected to face mask oxygen  Post-op Assessment: Report given to PACU RN and Post -op Vital signs reviewed and stable  Post vital signs: Reviewed and stable  Complications: No apparent anesthesia complications

## 2012-04-30 NOTE — Anesthesia Preprocedure Evaluation (Signed)
Anesthesia Evaluation  Patient identified by MRN, date of birth, ID band Patient awake    Reviewed: Allergy & Precautions, H&P , NPO status , Patient's Chart, lab work & pertinent test results  History of Anesthesia Complications (+) PONV  Airway Mallampati: I TM Distance: >3 FB Neck ROM: full    Dental  (+) Teeth Intact and Missing,    Pulmonary neg pulmonary ROS, pneumonia -,    Pulmonary exam normal       Cardiovascular hypertension, Pt. on medications Rhythm:regular Rate:Normal     Neuro/Psych PSYCHIATRIC DISORDERS Depression negative neurological ROS     GI/Hepatic negative GI ROS, Neg liver ROS,   Endo/Other  Morbid obesity  Renal/GU negative Renal ROS  negative genitourinary   Musculoskeletal negative musculoskeletal ROS (+)   Abdominal (+) + obese,   Peds negative pediatric ROS (+)  Hematology negative hematology ROS (+)   Anesthesia Other Findings   Reproductive/Obstetrics negative OB ROS History Breast CA, mastectomy                           Anesthesia Physical Anesthesia Plan  ASA: III  Anesthesia Plan: General   Post-op Pain Management:    Induction: Intravenous  Airway Management Planned: Oral ETT  Additional Equipment:   Intra-op Plan:   Post-operative Plan: Extubation in OR  Informed Consent: I have reviewed the patients History and Physical, chart, labs and discussed the procedure including the risks, benefits and alternatives for the proposed anesthesia with the patient or authorized representative who has indicated his/her understanding and acceptance.   Dental advisory given  Plan Discussed with: CRNA  Anesthesia Plan Comments:         Anesthesia Quick Evaluation

## 2012-04-30 NOTE — Progress Notes (Signed)
Dr. Rica Mast aware of patient's heart rates

## 2012-05-01 ENCOUNTER — Encounter (HOSPITAL_COMMUNITY): Payer: Self-pay | Admitting: Gynecologic Oncology

## 2012-05-01 LAB — BASIC METABOLIC PANEL
BUN: 12 mg/dL (ref 6–23)
Chloride: 100 mEq/L (ref 96–112)
GFR calc Af Amer: >90 mL/min (ref >90)
Glucose, Bld: 156 mg/dL — ABNORMAL HIGH (ref 70–99)
Potassium: 4.7 mEq/L (ref 3.5–5.1)
Sodium: 133 mEq/L — ABNORMAL LOW (ref 135–145)

## 2012-05-01 LAB — CBC
HCT: 38.4 % (ref 36.0–46.0)
Hemoglobin: 13.6 g/dL (ref 12.0–15.0)
RDW: 13.3 % (ref 11.5–15.5)
WBC: 17.1 10*3/uL — ABNORMAL HIGH (ref 4.0–10.5)

## 2012-05-01 NOTE — Progress Notes (Signed)
1 Day Post-Op Procedure(s) (LRB): EXPLORATORY LAPAROTOMY (N/A) HYSTERECTOMY ABDOMINAL (N/A) HERNIA REPAIR VENTRAL ADULT (N/A) SALPINGO OOPHERECTOMY (Bilateral)  Subjective: Patient reports nausea that resolved at 07:00am this morning.  Reporting burning sensation intermittently at abd incision.  Tolerating sips of water currently.   Objective: Vital signs in last 24 hours: Temp:  [97.3 F (36.3 C)-98.6 F (37 C)] 97.9 F (36.6 C) (07/17 0610) Pulse Rate:  [47-68] 61  (07/17 0610) Resp:  [13-20] 16  (07/17 0856) BP: (120-172)/(74-98) 140/79 mmHg (07/17 0610) SpO2:  [92 %-100 %] 96 % (07/17 0856) Weight:  [319 lb (144.697 kg)] 319 lb (144.697 kg) (07/16 1830) Last BM Date: 04/30/12  Intake/Output from previous day: 07/16 0701 - 07/17 0700 In: 1800 [I.V.:1800] Out: 1640 [Urine:1440; Blood:200]  Physical Examination: General: alert and cooperative Resp: diminished breath sounds Mildly diminished in bilateral bases Cardio: regular rate and rhythm, S1, S2 normal, no murmur, click, rub or gallop GI: abnormal findings:  hypoactive bowel sounds and obese and incision: midline incision with staples clean, dry, and intact. Extremities: extremities normal, atraumatic, no cyanosis or edema  Labs: WBC/Hgb/Hct/Plts:  17.1/13.6/38.4/229 (07/17 0554) BUN/Cr/glu/ALT/AST/amyl/lip:  12/0.65/--/--/--/--/-- (07/17 0554)   Assessment:  51 y.o. s/p Procedure(s): EXPLORATORY LAPAROTOMY HYSTERECTOMY ABDOMINAL HERNIA REPAIR VENTRAL ADULT SALPINGO OOPHERECTOMY: stable Pain:  Pain is well-controlled on PCA.  CV: Hypertension: Stable controlled. Current treatment:  hydrochlorothiazide (HCTZ) and lisinopril (Prinivil).  GI:  Tolerating po: Yes     Prophylaxis: intermittent pneumatic compression boots.  Plan: Advance diet Encourage ambulation   LOS: 1 day   Janet [name] DEAL 05/01/2012, 9:48 AM

## 2012-05-02 LAB — CBC WITH DIFFERENTIAL/PLATELET
Basophils Relative: 0 % (ref 0–1)
HCT: 37.5 % (ref 36.0–46.0)
Hemoglobin: 12.3 g/dL (ref 12.0–15.0)
Lymphs Abs: 1.6 10*3/uL (ref 0.7–4.0)
MCHC: 32.8 g/dL (ref 30.0–36.0)
Monocytes Absolute: 1.3 10*3/uL — ABNORMAL HIGH (ref 0.1–1.0)
Monocytes Relative: 8 % (ref 3–12)
Neutro Abs: 13.2 10*3/uL — ABNORMAL HIGH (ref 1.7–7.7)
Neutrophils Relative %: 82 % — ABNORMAL HIGH (ref 43–77)
RBC: 4.04 MIL/uL (ref 3.87–5.11)

## 2012-05-02 LAB — URINALYSIS, MICROSCOPIC ONLY
Bilirubin Urine: NEGATIVE
Glucose, UA: NEGATIVE mg/dL
Specific Gravity, Urine: 1.015 (ref 1.005–1.030)
Urobilinogen, UA: 0.2 mg/dL (ref 0.0–1.0)
pH: 6 (ref 5.0–8.0)

## 2012-05-02 MED ORDER — BISACODYL 10 MG RE SUPP
10.0000 mg | Freq: Once | RECTAL | Status: AC
  Administered 2012-05-02: 10 mg via RECTAL
  Filled 2012-05-02: qty 1

## 2012-05-02 NOTE — Progress Notes (Signed)
2 Days Post-Op Procedure(s) (LRB): EXPLORATORY LAPAROTOMY (N/A) HYSTERECTOMY ABDOMINAL (N/A) HERNIA REPAIR VENTRAL ADULT (N/A) SALPINGO OOPHERECTOMY (Bilateral)  Subjective: Patient reports tolerating PO.  Reporting minimal pain.  Denies flatus and having a bowel movement.  Denies dysuria, urgency, and frequency with urination.  No complaints voiced.  Objective: Vital signs in last 24 hours: Temp:  [97.8 F (36.6 C)-98.6 F (37 C)] 98 F (36.7 C) (07/18 0604) Pulse Rate:  [60-70] 60  (07/18 0604) Resp:  [18-20] 18  (07/18 0800) BP: (136-182)/(69-92) 138/83 mmHg (07/18 0604) SpO2:  [92 %-100 %] 98 % (07/18 0800) Last BM Date: 04/30/12  Intake/Output from previous day: 07/17 0701 - 07/18 0700 In: 5002.9 [P.O.:580; I.V.:4422.9] Out: 1250 [Urine:1250]  Physical Examination: General: alert, cooperative and no distress Resp: Diminished breath sounds in the bases Cardio: regular rate and rhythm, S1, S2 normal, no murmur, click, rub or gallop GI: abnormal findings:  obese and incision: clean, dry, intact and with staples Extremities: extremities normal, atraumatic, no cyanosis or edema  Labs: WBC/Hgb/Hct/Plts:  16.1/12.3/37.5/241 (07/18 0425)    Assessment: 51 y.o. s/p Procedure(s): EXPLORATORY LAPAROTOMY HYSTERECTOMY ABDOMINAL HERNIA REPAIR VENTRAL ADULT SALPINGO OOPHERECTOMY: stable Pain:  Pain is well-controlled on PCA.  ID:  WBC count this am 16.1 compared with 17.1 on 05/01/12    CV: Hypertension: Stable controlled. Current treatment:  hydrochlorothiazide (HCTZ) and lisinopril (Prinivil).  GI:  Tolerating po: Yes     Prophylaxis: intermittent pneumatic compression boots.  Plan: Encourage ambulation Advance to PO medication Discontinue IV fluids Check urine for signs of infection due to elevated WBC count Recheck CBC with diff in the am Saline lock IV Encourage IS use, deep breathing, and coughing Dulcolax suppository to stimulate the bowels   LOS: 2 days    CROSS, MELISSA DEAL 05/02/2012, 9:06 AM

## 2012-05-03 DIAGNOSIS — N8502 Endometrial intraepithelial neoplasia [EIN]: Secondary | ICD-10-CM | POA: Diagnosis present

## 2012-05-03 LAB — CBC WITH DIFFERENTIAL/PLATELET
Eosinophils Absolute: 0.5 10*3/uL (ref 0.0–0.7)
Eosinophils Relative: 5 % (ref 0–5)
Hemoglobin: 12.4 g/dL (ref 12.0–15.0)
Lymphocytes Relative: 20 % (ref 12–46)
Lymphs Abs: 2.1 10*3/uL (ref 0.7–4.0)
MCH: 31.5 pg (ref 26.0–34.0)
MCV: 93.1 fL (ref 78.0–100.0)
Monocytes Relative: 11 % (ref 3–12)
Platelets: 226 10*3/uL (ref 150–400)
RBC: 3.94 MIL/uL (ref 3.87–5.11)
WBC: 10.4 10*3/uL (ref 4.0–10.5)

## 2012-05-03 MED ORDER — OXYCODONE-ACETAMINOPHEN 5-325 MG PO TABS: 1.0000 | ORAL_TABLET | ORAL | Status: AC | PRN

## 2012-05-03 NOTE — Care Management Note (Signed)
    Page 1 of 1   05/03/2012     12:56:58 PM   CARE MANAGEMENT NOTE 05/03/2012  Patient:  Janet [name], Janet [name]   Account Number:  [id number]  Date Initiated:  05/01/2012  Documentation initiated by:  Lorenda Ishihara  Subjective/Objective Assessment:   51 yo female admitted s/p exploratory lap, hysterectomy and oopherectomy, hernia repair. PTA lived at home with mother.     Action/Plan:   Follow for d/c needs   Anticipated DC Date:  05/04/2012   Anticipated DC Plan:  HOME/SELF CARE      DC Planning Services  CM consult      Choice offered to / List presented to:             Status of service:  Completed, signed off Medicare Important Message given?   (If response is "NO", the following Medicare IM given date fields will be blank) Date Medicare IM given:   Date Additional Medicare IM given:    Discharge Disposition:  HOME/SELF CARE  Per UR Regulation:  Reviewed for med. necessity/level of care/duration of stay  If discussed at Long Length of Stay Meetings, dates discussed:    Comments:

## 2012-05-03 NOTE — Discharge Summary (Signed)
Physician Discharge Summary  Patient ID: Janet [name] MRN: [id number] DOB/AGE: 50-Aug-1962 50 y.o.  Admit date: 04/30/2012 Discharge date: 05/03/2012  Admission Diagnoses: Complex endometrial hyperplasia with atypia  Discharge Diagnoses:  Principal Problem:  *Complex endometrial hyperplasia with atypia  Discharged Condition: good  Hospital Course: On 04/30/2012, the patient underwent the following: Procedure(s): EXPLORATORY LAPAROTOMY HYSTERECTOMY ABDOMINAL HERNIA REPAIR VENTRAL ADULT SALPINGO OOPHERECTOMY.   The postoperative course was uneventful.  She was discharged to home on postoperative day 3 tolerating a regular diet.  Consults: None  Significant Diagnostic Studies: None  Treatments: surgery: See above  Discharge Exam: Blood pressure 156/89, pulse 66, temperature 98.5 F (36.9 C), temperature source Oral, resp. rate 18, height 5\' 6"  (1.676 m), weight 319 lb (144.697 kg), SpO2 98.00%. General appearance: alert, cooperative and no distress Resp: clear to auscultation bilaterally and Mildly diminished in bilateral bases Cardio: regular rate and rhythm, S1, S2 normal, no murmur, click, rub or gallop GI: abnormal findings:  obese and active bowel sounds, abd soft Extremities: extremities normal, atraumatic, no cyanosis or edema Incision/Wound: Midline abdominal incision with staples clean, dry, and intact  Disposition: 01-Home or Self Care  Discharge Orders    Future Orders Please Complete By Expires   Diet - low sodium heart healthy      Increase activity slowly      Driving Restrictions      Comments:   Do not take narcotics and drive.   Lifting restrictions      Comments:   No lifting greater than 30 lbs.   Sexual Activity Restrictions      Comments:   No sexual activity for 6 weeks.   Call MD for:  temperature >100.4      Call MD for:  persistant nausea and vomiting      Call MD for:  severe uncontrolled pain      Call MD for:  redness, tenderness, or  signs of infection (pain, swelling, redness, odor or green/yellow discharge around incision site)      Call MD for:  difficulty breathing, headache or visual disturbances      Call MD for:  hives      Call MD for:  persistant dizziness or light-headedness      Call MD for:  extreme fatigue        Medication List  As of 05/03/2012  8:52 AM   TAKE these medications         ferrous sulfate 324 (65 FE) MG Tbec   Take 1 tablet by mouth 2 (two) times daily.      furosemide 20 MG tablet   Commonly known as: LASIX   Take 20 mg by mouth as needed.      ibuprofen 200 MG tablet   Commonly known as: ADVIL,MOTRIN   Take 200 mg by mouth every 6 (six) hours as needed.      lisinopril-hydrochlorothiazide 20-12.5 MG per tablet   Commonly known as: PRINZIDE,ZESTORETIC   Take 0.5 tablets by mouth daily as needed. Take 1/2 tablet by mouth daily as needed.  She checks her BP daily.      multivitamin capsule   Take 1 capsule by mouth daily.      oxyCODONE-acetaminophen 5-325 MG per tablet   Commonly known as: PERCOCET/ROXICET   Take 1-2 tablets by mouth every 4 (four) hours as needed (moderate to severe pain (when tolerating fluids)).           Follow-up Information    Please follow  up. (Return to GYN Clinic on Wednesday May 08, 2012 around 11am for staple removal)         Signed: CROSS, MELISSA DEAL 05/03/2012, 8:52 AM

## 2012-05-05 LAB — URINE CULTURE: Colony Count: 30000

## 2012-05-06 ENCOUNTER — Telehealth: Payer: Self-pay | Admitting: Gynecologic Oncology

## 2012-05-06 NOTE — Telephone Encounter (Signed)
Post op telephone call to check patient status.  Patient describes expected post operative status.  Adequate PO intake reported.  Bowels and bladder functioning without difficulty.  Pain minimal.  Reportable signs and symptoms reviewed.  Pt to return to the clinic this week for staple removal.

## 2012-05-07 ENCOUNTER — Encounter: Payer: Self-pay | Admitting: Gynecologic Oncology

## 2012-05-07 NOTE — Progress Notes (Signed)
Pt arrived to the office today for staple removal.  38 staples removed from midline incision with no difficulty.  Steri strips applied.  No drainage noted.  Mild redness noted around the umbilicus, appearing yeast-like.  Pt instructed to keep the area clean and dry.  Reporting that she has medicated powder at home that has worked in the past.  Instructed to apply the powder at least twice daily and to notify the office if the area does not resolve.  Pt continuing to report adequate PO intake and bowel/bladder habits.  No questions or concerns voiced.  Pt to return to the office in 6 weeks from OR date for post-op check.  Pt to contact office to arrange when transportation confirmed.

## 2012-05-16 ENCOUNTER — Telehealth: Payer: Self-pay | Admitting: Gynecologic Oncology

## 2012-05-16 NOTE — Telephone Encounter (Signed)
Message left with patient about scheduling a 6 week post-op check with Dr. Duard Brady on June 13, 2012.  Instructed to call back to schedule a time.

## 2012-06-13 ENCOUNTER — Ambulatory Visit: Payer: Medicaid Other | Admitting: Gynecologic Oncology

## 2012-06-27 ENCOUNTER — Ambulatory Visit: Payer: Medicaid Other | Attending: Gynecologic Oncology | Admitting: Gynecologic Oncology

## 2012-06-27 ENCOUNTER — Encounter: Payer: Self-pay | Admitting: Gynecologic Oncology

## 2012-06-27 VITALS — BP 138/72 | HR 76 | Temp 97.8°F | Resp 20 | Ht 66.89 in | Wt 320.0 lb

## 2012-06-27 DIAGNOSIS — N8502 Endometrial intraepithelial neoplasia [EIN]: Secondary | ICD-10-CM | POA: Insufficient documentation

## 2012-06-27 NOTE — Progress Notes (Signed)
Consult Note: Gyn-Onc  Janet [name] 51 y.o. female  CC:  Chief Complaint  Patient presents with  . Complex hyperplasia w/ Atypia    follow up    HPI: Patient is a 51 year old gravida 1 para 0 who has a long history of dysfunctional uterine bleeding. Her history is quite complicated and dates back to December of 2011. 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 Estrogen and progesterone receptors were positive and was HER-2 positive. She had a complete left-sided mastectomy and has not completed her reconstructive surgery. She did not undergo any adjuvant therapy. Her course then complicated by dysfunctional uterine bleeding. She's required IV fluids for dehydration and has been prescribed Provera for cessation of bleeding which not help. She apparently had an endometrial biopsy in December of 2012 that was unremarkable. She had severe iron deficiency anemia from her dysfunctional bleeding and was on iron and most recently her hemoglobin was around the 12 range.   She became frustrated that her local providers were not doing anything with regards to bleeding was seen by Dr. Tamela Oddi. Dr. Tamela Oddi performed a attempted NovaSure endometrial ablation on May 17. At that time there was significant polypoid material so the ablation was not performed until a D&C to be done. The endometrium was inactive. There is no hyperplasia or carcinoma should a benign endometrial polyp. The patient then went back for at the ablation and at the time of hysteroscopy on May 31 was noted to have a small polyp. This was removed and the ablation was completed. 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  Since her ablation she's been doing well she's had no bleeding. She has a watery discharge. She does have a large abdominal wall hernia feels that it is slowly been  getting bigger. She has not been working and she has had issues with uninsurability. Prior to her history of the breast cancer diagnosis in December 2011 she had not seen a physician in greater than 30 years. She denies a change in her bladder habits. She has intentionally lost weight from 370 pounds to 292 pounds in 3 years. Her weight is now up approximately 20 pounds since April 19 as her brother who was 75 years of age unexpectedly died of an unknown cause. This was her only sibling.  Interval History:  She underwent a total abdominal hysterectomy bilateral salpingo-oophorectomy on July 16 of year 2013. Operative findings included an 18 cm ventral hernia with transverse colon and omentum. A 10 week size uterus. Frozen section was consistent with an ablation with no evidence of malignancy. Final pathology revealed chronic inflammation and ulceration of the cervix. The endometrium had diffuse fibrosis inflammation and hemorrhage consistent with prior procedure. There is no evidence of hyperplasia or malignancy. The bilateral adnexa were negative. The hernia sac had inflamed mesothelial and fibroadipose tissue consistent with a hernia sac there is no evidence of malignancy. She comes in today for her postoperative check.  Review of Systems She's had no vaginal bleeding. For 340 she's had some burning discomfort the umbilicus. There is no antecedent event. Does not wake her up at night she's not required any pain medications. She denies any nausea vomiting. She has a follow up with Dr. Ezzard Standing adnexa week for evaluation of reinforcement of our hernia repair. This may be done in conjunction with reconstruction for her breast cancer.  Current Meds:  Outpatient Encounter Prescriptions as of 06/27/2012  Medication Sig Dispense Refill  . ferrous sulfate 324 (65 FE) MG TBEC Take 1 tablet by mouth 2 (two) times daily.      . furosemide (LASIX) 20 MG tablet Take 20 mg by mouth as needed.      Marland Kitchen ibuprofen  (ADVIL,MOTRIN) 200 MG tablet Take 200 mg by mouth every 6 (six) hours as needed.      Marland Kitchen lisinopril-hydrochlorothiazide (PRINZIDE,ZESTORETIC) 20-12.5 MG per tablet Take 0.5 tablets by mouth daily as needed. Take 1/2 tablet by mouth daily as needed.  She checks her BP daily.      . Multiple Vitamin (MULTIVITAMIN) capsule Take 1 capsule by mouth daily.         Allergy:  Allergies  Allergen Reactions  . Amoxicillin Other (See Comments)    Decreased heart rate  . Clarithromycin Other (See Comments)    Breaks mouth out  . Ivp Dye (Iodinated Diagnostic Agents) Other (See Comments)    States made bp systolic has increased and diastolic has decreased and hasnt felt well x 3 weeks since dye adm  . Tape Dermatitis    Only can have paper tape    Social Hx:   History   Social History  . Marital Status: Single    Spouse Name: N/A    Number of Children: N/A  . Years of Education: N/A   Occupational History  . Not on file.   Social History Main Topics  . Smoking status: Never Smoker   . Smokeless tobacco: Never Used  . Alcohol Use: No  . Drug Use: No  . Sexually Active: No   Other Topics Concern  . Not on file   Social History Narrative  . No narrative on file    Past Surgical Hx:  Past Surgical History  Procedure Date  . Reconstruction breast immediate / delayed w/ tissue expander     tissue expander lt-9/12  . Toe osteotomy     rt toe  . Multiple extractions with alveoloplasty 10/23/2011  . Dilation and curettage of uterus 01/26/2012    Procedure: DILATATION AND CURETTAGE;  Surgeon: Antionette Char, MD;  Location: [facility];  Service: Gynecology;;  . Multiple tooth extractions   . Novasure ablation 03/01/2012  . Breast surgery 2012    lt mastectomy/ axillary dissection  . Breast surgery 2012    tissue expander lt  . Colonoscopy   . Esophagogastroduodenoscopy   . Laparotomy 04/30/2012    Procedure: EXPLORATORY LAPAROTOMY;  Surgeon: Rejeana Brock A. Duard Brady, MD;  Location: [facility];   Service: Gynecology;  Laterality: N/A;  . Abdominal hysterectomy 04/30/2012    Procedure: HYSTERECTOMY ABDOMINAL;  Surgeon: Rejeana Brock A. Duard Brady, MD;  Location: [facility];  Service: Gynecology;  Laterality: N/A;  . Ventral hernia repair 04/30/2012    Procedure: HERNIA REPAIR VENTRAL ADULT;  Surgeon: Rejeana Brock A. Duard Brady, MD;  Location: [facility];  Service: Gynecology;  Laterality: N/A;  . Salpingoophorectomy 04/30/2012    Procedure: SALPINGO OOPHERECTOMY;  Surgeon: Rejeana Brock A. Duard Brady, MD;  Location: [facility];  Service: Gynecology;  Laterality: Bilateral;    Past Medical Hx:  Past Medical History  Diagnosis Date  . Anemia     history of anemia from AUB  . Umbilical hernia     currently - not repaired yet  . Complication of anesthesia     difficult IV access- never needed PICC line  . Depression     no meds  . PONV (postoperative nausea  and vomiting)   . Anxiety   . Pneumonia 1989  . Breast cancer 2012    left breast/  NO CHEMO  . Edema of both legs   . Hypertension     ekg 1/13 on chart    Family Hx: History reviewed. No pertinent family history.  Vitals:  Blood pressure 138/72, pulse 76, temperature 97.8 F (36.6 C), temperature source Oral, resp. rate 20, height 5' 6.89" (1.699 m), weight 320 lb (145.151 kg).  Physical Exam: Well-nourished well-developed female in no acute distress.  Abdomen: Morbidly obese. His well-healed vertical midline incision. There is postoperative induration and changes. There is no evidence of persistent hernia.  Pelvic: External genitalia within normal limits. Vagina is atrophic. Vaginal cuff is visualized. There's no visible lesions. Bimanual examination is limited secondary morbid obesity but there is no fluctuance or tenderness.  Assessment/Plan: 51 year old with complex hyperplasia the time of D&C endometrial ablation who had a completion hysterectomy BSO and hernia repair. She fortunately did not have any evidence of cancer. 2 months after her surgery she's doing  well. She will be released from our clinic and can resume her normal postoperative activities. She'll keep her appointments with her other providers as scheduled. She knows that we will be more than happy to see her in the future should the need arise.  Ohana Birdwell A., MD 06/27/2012, 10:38 AM

## 2012-06-27 NOTE — Patient Instructions (Signed)
Return to clinic as needed

## 2012-07-03 ENCOUNTER — Ambulatory Visit (INDEPENDENT_AMBULATORY_CARE_PROVIDER_SITE_OTHER): Payer: Medicaid Other | Admitting: Surgery

## 2012-07-03 ENCOUNTER — Encounter (INDEPENDENT_AMBULATORY_CARE_PROVIDER_SITE_OTHER): Payer: Self-pay | Admitting: Surgery

## 2012-07-03 VITALS — BP 132/84 | HR 70 | Temp 97.1°F | Resp 16 | Ht 66.0 in | Wt 312.2 lb

## 2012-07-03 DIAGNOSIS — K436 Other and unspecified ventral hernia with obstruction, without gangrene: Secondary | ICD-10-CM

## 2012-07-03 NOTE — Progress Notes (Signed)
Re:   Janet [name] DOB:   1961-01-13 MRN:   [id number]  ASSESSMENT AND PLAN: 1.  Ventral hernia/Umbilical hernia.  Dr. Jennet Maduro repaired the hernia at the same time that she did the hysterectomy.  The repair looks good.  We talked about weight lifting restriction, which makes some sense the first month, but less the further she gets out from surgery.  There is nothing for me to do right now.  I did talk to her about the one thing that she could do was to lose weight.  Return PRN.  2.  Endometrial atypical hyperplasia.  Hysterectomy - Dr. Jennet Maduro - 04/30/2012 - Her final pathology was benign 3. Left breast cancer - 2011  Reconstruction - Dr. Rhona Leavens - 07/13/2011.  Surgeon - Dr. Mat Carne in [place].  Med Oncologist - Dr. Dory Larsen.  She said that her ER - 98%, PR - 93%.  She said that she still has two more breast operations.  She is on the [employer] (which covers her medical costs) for about another year. 3.  Hypertension. 4.  Depression. 5.  History of anemia.  Hgb - 12.8 - 03/01/2012. 6.  Morbid obesity - BMI - 50.4  Chief Complaint  Patient presents with  . Follow-up    hernia   REFERRING PHYSICIAN:  Dr. Cleda Mccreedy  HISTORY OF PRESENT ILLNESS: Janet [name] is a 51 y.o. (DOB: 10-May-1961)  white female whose primary care physician is Dr. Kathrynn Humble (but he left unexpectedly, so she has no PCP) and comes to me follow up of a ventral hernia.  Dr. Duard Brady repaired this hernia during her hysterectomy - so there is really nothing for me to do.  GYN and Abdominal Wall History: She says that I am the 17th doctor that she has seen in the last year.  The patient said her problems began over 6 months ago when she had some increased uterine bleeding and was anemic.  She saw 2 gynecologist in [place] who tried to treat her bleeding. She tried Provera which did not help and in December 2012, she had an endometrial biopsy which was unremarkable.  With her persistent  anemia and bleeding Dr. Shella Spearing suggested that she see Dr. Tamela Oddi. She said that she has undergone ablation 3 times. On the last ablation her pathology showed atypical hyperplasia she was referred to Dr. Rockney Ghee.  Dr. Rockney Ghee did a hysterectomy on 04/30/2012. She also  has had a long-standing ventral hernia which Dr. Duard Brady repaired. 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 She thinks her hernia is unchanged over the last 2-3 years. She also thinks she has lost about 80 pounds of weight over the last 2-3 years.    Past Medical History  Diagnosis Date  . Anemia     history of anemia from AUB  . Umbilical hernia     currently - not repaired yet  . Complication of anesthesia     difficult IV access- never needed PICC line  . Depression     no meds  . PONV (postoperative nausea and vomiting)   . Anxiety   . Pneumonia 1989  . Breast cancer 2012    left breast/  NO CHEMO  . Edema of both legs   . Hypertension     ekg 1/13 on chart      Past Surgical History  Procedure Date  . Reconstruction  breast immediate / delayed w/ tissue expander     tissue expander lt-9/12  . Toe osteotomy     rt toe  . Multiple extractions with alveoloplasty 10/23/2011  . Dilation and curettage of uterus 01/26/2012    Procedure: DILATATION AND CURETTAGE;  Surgeon: Antionette Char, MD;  Location: [facility];  Service: Gynecology;;  . Multiple tooth extractions   . Novasure ablation 03/01/2012  . Breast surgery 2012    lt mastectomy/ axillary dissection  . Breast surgery 2012    tissue expander lt  . Colonoscopy   . Esophagogastroduodenoscopy   . Laparotomy 04/30/2012    Procedure: EXPLORATORY LAPAROTOMY;  Surgeon: Rejeana Brock A. Duard Brady, MD;  Location: [facility];  Service: Gynecology;  Laterality: N/A;  . Abdominal hysterectomy 04/30/2012    Procedure: HYSTERECTOMY ABDOMINAL;  Surgeon: Rejeana Brock A. Duard Brady, MD;  Location: [facility];  Service:  Gynecology;  Laterality: N/A;  . Ventral hernia repair 04/30/2012    Procedure: HERNIA REPAIR VENTRAL ADULT;  Surgeon: Rejeana Brock A. Duard Brady, MD;  Location: [facility];  Service: Gynecology;  Laterality: N/A;  . Salpingoophorectomy 04/30/2012    Procedure: SALPINGO OOPHERECTOMY;  Surgeon: Rejeana Brock A. Duard Brady, MD;  Location: [facility];  Service: Gynecology;  Laterality: Bilateral;      Current Outpatient Prescriptions  Medication Sig Dispense Refill  . ferrous sulfate 324 (65 FE) MG TBEC Take by mouth daily.      Marland Kitchen ibuprofen (ADVIL,MOTRIN) 200 MG tablet Take 200 mg by mouth every 6 (six) hours as needed.      Marland Kitchen lisinopril-hydrochlorothiazide (PRINZIDE,ZESTORETIC) 20-12.5 MG per tablet Take 0.5 tablets by mouth daily as needed. Take 1/2 tablet by mouth daily as needed.  She checks her BP daily.      . Multiple Vitamin (MULTIVITAMIN) capsule Take 1 capsule by mouth daily.           Allergies  Allergen Reactions  . Amoxicillin Other (See Comments)    Decreased heart rate  . Clarithromycin Other (See Comments)    Breaks mouth out  . Ivp Dye (Iodinated Diagnostic Agents) Other (See Comments)    States made bp systolic has increased and diastolic has decreased and hasnt felt well x 3 weeks since dye adm  . Tape Dermatitis    Only can have paper tape    REVIEW OF SYSTEMS:  Breasts:  History of left sided breast cancer. Gastrointestinal:  No history of stomach disease.  No history of liver disease.  No history of gall bladder disease.  No history of pancreas disease.  No history of colon disease.  She says that she has lost about 80 pounds over the last 2 or 3 years intentionally. Musculoskeletal:  No history of joint or back disease. Hematologic:  Has had anemia.  But her most recent Hgb looks okay. Psycho-social:  The patient is oriented.  History of depression.  Very talkative about her problems.  Her brother (who weighed around 400 pounds) died unexpectedly 02/19/2012.  SOCIAL and FAMILY  HISTORY: Divorced. No children Graduated from [employer] with a degree in accounting. But has not worked since 2008.  She worked as Catering manager and office person.  PHYSICAL EXAM: BP 132/84  Pulse 70  Temp 97.1 F (36.2 C) (Temporal)  Resp 16  Ht 5\' 6"  (1.676 m)  Wt 312 lb 4 oz (141.636 kg)  BMI 50.40 kg/m2  General: Obese WF who is alert and generally healthy appearing.  Abdomen: Soft. Normal bowel sounds.  Well healed midline abdominal incision.  No mass or hernia  noted.  The incision looks good.  DATA REVIEWED: Notes from Dr. Duard Brady.  Ovidio Kin, MD,  [facility] [address] [place]  [address]   [place], [place]    [address] Phone:  [phone] FAX:  [phone]

## 2012-07-18 ENCOUNTER — Ambulatory Visit: Payer: Medicaid Other | Admitting: Gynecologic Oncology

## 2012-09-27 ENCOUNTER — Other Ambulatory Visit: Payer: Self-pay | Admitting: Plastic Surgery

## 2012-09-27 DIAGNOSIS — Z9012 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: Secondary | ICD-10-CM | POA: Insufficient documentation

## 2012-09-27 DIAGNOSIS — Z9010 Acquired absence of unspecified breast and nipple: Secondary | ICD-10-CM

## 2012-09-27 NOTE — H&P (Signed)
This document contains confidential information from a [facility] medical record system and may be unauthenticated. Release may be made only with a valid authorization or in accordance with applicable policies of Medical Center or its affiliates. This document must be maintained in a secure manner or discarded/destroyed as required by Medical Center policy or by a confidential means such as shredding.   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 female.  HPI Janet [name] is a 51 yrs old female here for a history and physical for her second procedure for left breast reconstruction after her mastectomy with expander and FlexHD placement.               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 The tumor was ER/PR positive, HER-2 negative, and negative SLN. She did not have chemotherapy or radiation. The diagnosis of the breast cancer was pursued after months of left nipple discharge. She was a 47C bra prior to surgery. She has a good shape and would like to have a mastopexy on the right to match when we do the expander exchange. She was pleased with the size of 530 cc and currently has 580 cc. She is taking iron pills and her hemoglobin has improved. She underwent a hysterectomy and is doing much better. Her brother died and that was difficult for her to handle but she has been doing better.             She presents today for history and physical for left breast expander removal and placement of implant for reconstruction after breast cancer and right breast  reduction for symmetry.  The following portions of the patient's history were reviewed and updated as appropriate: allergies, current medications, past family history, past medical history, past social history, past surgical history and problem list.  Review of Systems  Constitutional: Negative.   HENT: Negative.   Eyes: Negative.   Respiratory: Negative.   Cardiovascular: Negative.   Gastrointestinal: Negative.   Genitourinary: Negative.   Musculoskeletal: Negative.   Skin: Negative.   Neurological: Negative.   Hematological: Negative.   Psychiatric/Behavioral: Negative.      Objective:    Physical Exam  Constitutional: She is oriented to person, place, and time. She appears well-developed and well-nourished. No distress.       Obese female in NAD.   HENT:   Head: Normocephalic and atraumatic.   Nose: Nose normal.   Mouth/Throat: Oropharynx is clear and moist. No oropharyngeal exudate.  Eyes: Conjunctivae normal and EOM are normal. Pupils are equal, round, and reactive to light. Right eye exhibits no discharge. Left eye exhibits no discharge. No scleral icterus.  Neck: Normal range of motion. Neck supple. No JVD present. No tracheal deviation present. No thyromegaly present.  Cardiovascular: Normal rate, regular rhythm and intact distal pulses.  Exam reveals gallop. Exam reveals no friction rub.    No murmur heard.  BP was noted to be 193/104 with multiple checks  Pulmonary/Chest: Effort normal and breath sounds normal. No stridor. No respiratory distress. She has no wheezes. She has no rales. She exhibits no tenderness.       Left breast incision is well healed and expander is in place.   Abdominal: Soft. Bowel sounds are normal. She exhibits no distension and no mass. There is no tenderness.  Musculoskeletal: Normal range of motion.  Lymphadenopathy:    She has no cervical adenopathy.  Neurological: She is alert and oriented to person, place, and time.  Skin: Skin is warm  and dry.  Psychiatric: She has a normal mood and affect. Her behavior is normal. Judgment and thought content normal.      Assessment:      1.  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

## 2012-10-01 ENCOUNTER — Encounter (HOSPITAL_BASED_OUTPATIENT_CLINIC_OR_DEPARTMENT_OTHER): Payer: Self-pay | Admitting: *Deleted

## 2012-10-01 NOTE — Progress Notes (Signed)
This is pt's 3rd surgery this yr-had hyst 7/13. No cardiac or resp problems Does have htn-will take bp med in am-will need [address]

## 2012-10-02 ENCOUNTER — Encounter (HOSPITAL_BASED_OUTPATIENT_CLINIC_OR_DEPARTMENT_OTHER): Payer: Self-pay | Admitting: Anesthesiology

## 2012-10-02 ENCOUNTER — Encounter (HOSPITAL_BASED_OUTPATIENT_CLINIC_OR_DEPARTMENT_OTHER)
Admission: RE | Disposition: A | Discharge status: Home / Self Care | Payer: Self-pay | Source: Ambulatory Visit | Attending: Plastic Surgery

## 2012-10-02 ENCOUNTER — Ambulatory Visit (HOSPITAL_BASED_OUTPATIENT_CLINIC_OR_DEPARTMENT_OTHER)
Admission: RE | Admit: 2012-10-02 | Discharge: 2012-10-02 | Disposition: A | Discharge status: Home / Self Care | Payer: Medicaid Other | Source: Ambulatory Visit | Attending: Plastic Surgery | Admitting: Plastic Surgery

## 2012-10-02 ENCOUNTER — Encounter (HOSPITAL_BASED_OUTPATIENT_CLINIC_OR_DEPARTMENT_OTHER): Payer: Self-pay | Admitting: Plastic Surgery

## 2012-10-02 ENCOUNTER — Encounter (HOSPITAL_BASED_OUTPATIENT_CLINIC_OR_DEPARTMENT_OTHER): Payer: Self-pay | Admitting: *Deleted

## 2012-10-02 ENCOUNTER — Ambulatory Visit (HOSPITAL_BASED_OUTPATIENT_CLINIC_OR_DEPARTMENT_OTHER): Payer: Medicaid Other | Admitting: Anesthesiology

## 2012-10-02 DIAGNOSIS — Z853 Personal history of malignant neoplasm of breast: Secondary | ICD-10-CM | POA: Insufficient documentation

## 2012-10-02 DIAGNOSIS — D249 Benign neoplasm of unspecified breast: Secondary | ICD-10-CM | POA: Insufficient documentation

## 2012-10-02 DIAGNOSIS — Z9010 Acquired absence of unspecified breast and nipple: Secondary | ICD-10-CM | POA: Insufficient documentation

## 2012-10-02 DIAGNOSIS — I10 Essential (primary) hypertension: Secondary | ICD-10-CM | POA: Insufficient documentation

## 2012-10-02 DIAGNOSIS — N651 Disproportion of reconstructed breast: Secondary | ICD-10-CM | POA: Insufficient documentation

## 2012-10-02 HISTORY — PX: MASTOPEXY: SHX5358

## 2012-10-02 HISTORY — PX: BREAST CAPSULECTOMY WITH IMPLANT EXCHANGE: SHX5592

## 2012-10-02 LAB — POCT I-STAT, CHEM 8
BUN: 15 mg/dL (ref 6–23)
Calcium, Ion: 1.18 mmol/L (ref 1.12–1.23)
Chloride: 103 mEq/L (ref 96–112)
Creatinine, Ser: 0.9 mg/dL (ref 0.50–1.10)

## 2012-10-02 SURGERY — CAPSULECTOMY, BREAST, WITH REPLACEMENT OF IMPLANT
Anesthesia: General | Site: Breast | Laterality: Right | Wound class: Clean

## 2012-10-02 MED ORDER — VANCOMYCIN HCL 1000 MG IV SOLR
1000.0000 mg | INTRAVENOUS | Status: DC | PRN
  Administered 2012-10-02: 1000 mg via INTRAVENOUS

## 2012-10-02 MED ORDER — SODIUM CHLORIDE 0.9 % IJ SOLN
INTRAMUSCULAR | Status: DC | PRN
  Administered 2012-10-02: 50 mL via INTRAVENOUS

## 2012-10-02 MED ORDER — BUPIVACAINE-EPINEPHRINE 0.25% -1:200000 IJ SOLN
INTRAMUSCULAR | Status: DC | PRN
  Administered 2012-10-02: 30 mL

## 2012-10-02 MED ORDER — ONDANSETRON HCL 4 MG/2ML IJ SOLN: 4.0000 mg | Freq: Once | INTRAMUSCULAR | Status: DC | PRN

## 2012-10-02 MED ORDER — SODIUM CHLORIDE 0.9 % IR SOLN
Status: DC | PRN
  Administered 2012-10-02: 14:00:00

## 2012-10-02 MED ORDER — CIPROFLOXACIN IN D5W 400 MG/200ML IV SOLN
400.0000 mg | INTRAVENOUS | Status: AC
  Administered 2012-10-02: 400 mg via INTRAVENOUS

## 2012-10-02 MED ORDER — DEXAMETHASONE SODIUM PHOSPHATE 4 MG/ML IJ SOLN
INTRAMUSCULAR | Status: DC | PRN
  Administered 2012-10-02: 8 mg via INTRAVENOUS

## 2012-10-02 MED ORDER — LIDOCAINE HCL (CARDIAC) 20 MG/ML IV SOLN
INTRAVENOUS | Status: DC | PRN
  Administered 2012-10-02: 90 mg via INTRAVENOUS

## 2012-10-02 MED ORDER — HYDROMORPHONE HCL PF 1 MG/ML IJ SOLN
0.2500 mg | INTRAMUSCULAR | Status: DC | PRN
  Administered 2012-10-02 (×3): 0.5 mg via INTRAVENOUS

## 2012-10-02 MED ORDER — LACTATED RINGERS IV SOLN
INTRAVENOUS | Status: DC
  Administered 2012-10-02 (×3): via INTRAVENOUS

## 2012-10-02 MED ORDER — SUCCINYLCHOLINE CHLORIDE 20 MG/ML IJ SOLN
INTRAMUSCULAR | Status: DC | PRN
  Administered 2012-10-02: 100 mg via INTRAVENOUS

## 2012-10-02 MED ORDER — SCOPOLAMINE 1 MG/3DAYS TD PT72
1.0000 | MEDICATED_PATCH | TRANSDERMAL | Status: DC
  Administered 2012-10-02: 1.5 mg via TRANSDERMAL

## 2012-10-02 MED ORDER — PROPOFOL 10 MG/ML IV BOLUS
INTRAVENOUS | Status: DC | PRN
  Administered 2012-10-02: 300 mg via INTRAVENOUS

## 2012-10-02 MED ORDER — METOCLOPRAMIDE HCL 5 MG/ML IJ SOLN
5.0000 mg | Freq: Once | INTRAMUSCULAR | Status: AC | PRN
  Administered 2012-10-02: 5 mg via INTRAVENOUS

## 2012-10-02 MED ORDER — OXYCODONE HCL 5 MG PO TABS: 5.0000 mg | ORAL_TABLET | Freq: Once | ORAL | Status: DC | PRN

## 2012-10-02 MED ORDER — FENTANYL CITRATE 0.05 MG/ML IJ SOLN
INTRAMUSCULAR | Status: DC | PRN
  Administered 2012-10-02: 100 ug via INTRAVENOUS

## 2012-10-02 MED ORDER — OXYCODONE HCL 5 MG/5ML PO SOLN: 5.0000 mg | Freq: Once | ORAL | Status: DC | PRN

## 2012-10-02 MED ORDER — ONDANSETRON HCL 4 MG/2ML IJ SOLN
INTRAMUSCULAR | Status: DC | PRN
  Administered 2012-10-02: 4 mg via INTRAVENOUS

## 2012-10-02 MED ORDER — PHENYLEPHRINE HCL 10 MG/ML IJ SOLN
10.0000 mg | INTRAVENOUS | Status: DC | PRN
  Administered 2012-10-02: 50 ug/min via INTRAVENOUS

## 2012-10-02 MED ORDER — MIDAZOLAM HCL 5 MG/5ML IJ SOLN
INTRAMUSCULAR | Status: DC | PRN
  Administered 2012-10-02: 2 mg via INTRAVENOUS

## 2012-10-02 SURGICAL SUPPLY — 72 items
BAG DECANTER FOR FLEXI CONT (MISCELLANEOUS) ×3 IMPLANT
BANDAGE GAUZE ELAST BULKY 4 IN (GAUZE/BANDAGES/DRESSINGS) IMPLANT
BINDER BREAST LRG (GAUZE/BANDAGES/DRESSINGS) IMPLANT
BINDER BREAST MEDIUM (GAUZE/BANDAGES/DRESSINGS) IMPLANT
BINDER BREAST XLRG (GAUZE/BANDAGES/DRESSINGS) IMPLANT
BINDER BREAST XXLRG (GAUZE/BANDAGES/DRESSINGS) ×3 IMPLANT
BIOPATCH BLUE 3/4IN DISK W/1.5 (GAUZE/BANDAGES/DRESSINGS) IMPLANT
BLADE HEX COATED 2.75 (ELECTRODE) ×3 IMPLANT
BLADE SURG 10 STRL SS (BLADE) ×6 IMPLANT
BLADE SURG 15 STRL LF DISP TIS (BLADE) ×6 IMPLANT
BLADE SURG 15 STRL SS (BLADE) ×3
CANISTER SUCTION 1200CC (MISCELLANEOUS) ×3 IMPLANT
CHLORAPREP W/TINT 26ML (MISCELLANEOUS) ×9 IMPLANT
CLOTH BEACON ORANGE TIMEOUT ST (SAFETY) ×3 IMPLANT
CORDS BIPOLAR (ELECTRODE) IMPLANT
COVER MAYO STAND STRL (DRAPES) ×3 IMPLANT
COVER TABLE BACK 60X90 (DRAPES) ×3 IMPLANT
DECANTER SPIKE VIAL GLASS SM (MISCELLANEOUS) IMPLANT
DERMABOND ADVANCED (GAUZE/BANDAGES/DRESSINGS) ×4
DERMABOND ADVANCED .7 DNX12 (GAUZE/BANDAGES/DRESSINGS) ×8 IMPLANT
DRAIN CHANNEL 19F RND (DRAIN) IMPLANT
DRAPE LAPAROSCOPIC ABDOMINAL (DRAPES) ×3 IMPLANT
DRSG PAD ABDOMINAL 8X10 ST (GAUZE/BANDAGES/DRESSINGS) ×9 IMPLANT
DRSG TEGADERM 2-3/8X2-3/4 SM (GAUZE/BANDAGES/DRESSINGS) IMPLANT
ELECT BLADE 4.0 EZ CLEAN MEGAD (MISCELLANEOUS) ×3
ELECT BLADE 6.5 .24CM SHAFT (ELECTRODE) IMPLANT
ELECT REM PT RETURN 9FT ADLT (ELECTROSURGICAL) ×3
ELECTRODE BLDE 4.0 EZ CLN MEGD (MISCELLANEOUS) ×2 IMPLANT
ELECTRODE REM PT RTRN 9FT ADLT (ELECTROSURGICAL) ×2 IMPLANT
EVACUATOR SILICONE 100CC (DRAIN) IMPLANT
GAUZE SPONGE 4X4 12PLY STRL LF (GAUZE/BANDAGES/DRESSINGS) IMPLANT
GLOVE BIO SURGEON STRL SZ 6.5 (GLOVE) ×6 IMPLANT
GLOVE BIO SURGEON STRL SZ7 (GLOVE) ×3 IMPLANT
GLOVE BIOGEL PI IND STRL 7.0 (GLOVE) ×2 IMPLANT
GLOVE BIOGEL PI INDICATOR 7.0 (GLOVE) ×1
GLOVE SKINSENSE NS SZ6.5 (GLOVE) ×1
GLOVE SKINSENSE STRL SZ6.5 (GLOVE) ×2 IMPLANT
GOWN PREVENTION PLUS XLARGE (GOWN DISPOSABLE) ×9 IMPLANT
IMPL GEL HP 550CC (Breast) ×2 IMPLANT
IMPLANT GEL HP 550CC (Breast) ×3 IMPLANT
IV NS 1000ML (IV SOLUTION)
IV NS 1000ML BAXH (IV SOLUTION) IMPLANT
IV NS 500ML (IV SOLUTION)
IV NS 500ML BAXH (IV SOLUTION) IMPLANT
KIT FILL MCGHAN 30CC (MISCELLANEOUS) IMPLANT
NEEDLE HYPO 25X1 1.5 SAFETY (NEEDLE) ×3 IMPLANT
NEEDLE SPNL 18GX3.5 QUINCKE PK (NEEDLE) ×3 IMPLANT
NS IRRIG 1000ML POUR BTL (IV SOLUTION) IMPLANT
PACK BASIN DAY SURGERY FS (CUSTOM PROCEDURE TRAY) ×3 IMPLANT
PENCIL BUTTON HOLSTER BLD 10FT (ELECTRODE) ×3 IMPLANT
PIN SAFETY STERILE (MISCELLANEOUS) IMPLANT
SLEEVE SCD COMPRESS KNEE MED (MISCELLANEOUS) ×3 IMPLANT
SPONGE GAUZE 4X4 12PLY (GAUZE/BANDAGES/DRESSINGS) IMPLANT
SPONGE LAP 18X18 X RAY DECT (DISPOSABLE) ×6 IMPLANT
STRIP CLOSURE SKIN 1/2X4 (GAUZE/BANDAGES/DRESSINGS) IMPLANT
SUT MNCRL AB 4-0 PS2 18 (SUTURE) IMPLANT
SUT MON AB 5-0 PS2 18 (SUTURE) ×18 IMPLANT
SUT PDS 3-0 CT2 (SUTURE)
SUT PDS AB 2-0 CT2 27 (SUTURE) IMPLANT
SUT PDS II 3-0 CT2 27 ABS (SUTURE) IMPLANT
SUT SILK 3 0 PS 1 (SUTURE) IMPLANT
SUT VIC AB 3-0 SH 27 (SUTURE) ×3
SUT VIC AB 3-0 SH 27X BRD (SUTURE) ×6 IMPLANT
SUT VIC AB 5-0 PS2 18 (SUTURE) ×3 IMPLANT
SUT VICRYL 4-0 PS2 18IN ABS (SUTURE) ×9 IMPLANT
SYR 50ML LL SCALE MARK (SYRINGE) IMPLANT
SYR BULB IRRIGATION 50ML (SYRINGE) ×3 IMPLANT
SYR CONTROL 10ML LL (SYRINGE) ×3 IMPLANT
TOWEL OR 17X24 6PK STRL BLUE (TOWEL DISPOSABLE) ×6 IMPLANT
TUBE CONNECTING 20X1/4 (TUBING) ×3 IMPLANT
UNDERPAD 30X30 INCONTINENT (UNDERPADS AND DIAPERS) ×6 IMPLANT
YANKAUER SUCT BULB TIP NO VENT (SUCTIONS) ×3 IMPLANT

## 2012-10-02 NOTE — Interval H&P Note (Signed)
History and Physical Interval Note:  10/02/2012 11:30 AM  Janet [name]  has presented today for surgery, with the diagnosis of BREAST CANCER  The various methods of treatment have been discussed with the patient and family. After consideration of risks, benefits and other options for treatment, the patient has consented to  Procedure(s) (LRB) with comments: BREAST CAPSULECTOMY WITH IMPLANT EXCHANGE (Left) -  LEFT EXPANDER REMOVAL WITH CAPSULECTOMY AND PLACEMENT OF IMPLANT MASTOPEXY (Right) - RIGHT BREAST MASTOPEXY REDUCTION FOR SYMMETRY, as a surgical intervention .  The patient's history has been reviewed, patient examined, no change in status, stable for surgery.  I have reviewed the patient's chart and labs.  Questions were answered to the patient's satisfaction.     SANGER,Britne Borelli

## 2012-10-02 NOTE — H&P (View-Only) (Signed)
This document contains confidential information from a [facility] medical record system and may be unauthenticated. Release may be made only with a valid authorization or in accordance with applicable policies of Medical Center or its affiliates. This document must be maintained in a secure manner or discarded/destroyed as required by Medical Center policy or by a confidential means such as shredding.   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 female.  HPI Janet [name] is a 51 yrs old female here for a history and physical for her second procedure for left breast reconstruction after her mastectomy with expander and FlexHD placement.               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 She did not have chemotherapy or radiation. The diagnosis of the breast cancer was pursued after months of left nipple discharge. She was a 47C bra prior to surgery. She has a good shape and would like to have a mastopexy on the right to match when we do the expander exchange. She was pleased with the size of 530 cc and currently has 580 cc. She is taking iron pills and her hemoglobin has improved. She underwent a hysterectomy and is doing much better. Her brother died and that was difficult for her to handle but she has been doing better.             She presents today for history and physical for left breast expander removal and placement of implant for reconstruction after breast cancer and right breast  reduction for symmetry.  The following portions of the patient's history were reviewed and updated as appropriate: allergies, current medications, past family history, past medical history, past social history, past surgical history and problem list.  Review of Systems  Constitutional: Negative.   HENT: Negative.   Eyes: Negative.   Respiratory: Negative.   Cardiovascular: Negative.   Gastrointestinal: Negative.   Genitourinary: Negative.   Musculoskeletal: Negative.   Skin: Negative.   Neurological: Negative.   Hematological: Negative.   Psychiatric/Behavioral: Negative.      Objective:    Physical Exam  Constitutional: She is oriented to person, place, and time. She appears well-developed and well-nourished. No distress.       Obese female in NAD.   HENT:   Head: Normocephalic and atraumatic.   Nose: Nose normal.   Mouth/Throat: Oropharynx is clear and moist. No oropharyngeal exudate.  Eyes: Conjunctivae normal and EOM are normal. Pupils are equal, round, and reactive to light. Right eye exhibits no discharge. Left eye exhibits no discharge. No scleral icterus.  Neck: Normal range of motion. Neck supple. No JVD present. No tracheal deviation present. No thyromegaly present.  Cardiovascular: Normal rate, regular rhythm and intact distal pulses.  Exam reveals gallop. Exam reveals no friction rub.    No murmur heard.        BP was noted to be 193/104 with multiple checks  Pulmonary/Chest: Effort normal and breath sounds normal. No stridor. No respiratory distress. She has no wheezes. She has no rales. She exhibits no tenderness.       Left breast incision is well healed and expander is in place.   Abdominal: Soft. Bowel sounds are normal. She exhibits no distension and no mass. There is no tenderness.  Musculoskeletal: Normal range of motion.  Lymphadenopathy:    She has no cervical adenopathy.  Neurological: She is alert and oriented to person, place, and time.  Skin: Skin is warm  and dry.  Psychiatric: She has a normal mood and affect. Her behavior is normal. Judgment and thought content normal.      Assessment:      1.  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        

## 2012-10-02 NOTE — Brief Op Note (Signed)
10/02/2012  4:07 PM  PATIENT:  Janet [name]  51 y.o. female  PRE-OPERATIVE DIAGNOSIS:  BREAST CANCER  POST-OPERATIVE DIAGNOSIS:  BREAST CANCER left and post surgical assymetry right breast  PROCEDURE:  Procedure(s) (LRB) with comments: BREAST CAPSULECTOMY WITH IMPLANT EXCHANGE (Left) -  LEFT EXPANDER REMOVAL WITH CAPSULECTOMY AND PLACEMENT OF IMPLANT MASTOPEXY (Right) - RIGHT BREAST MASTOPEXY REDUCTION FOR SYMMETRY,  SURGEON:  Surgeon(s) and Role:    * [employer], DO - Primary  PHYSICIAN ASSISTANT: none  ASSISTANTS: Harrie Foreman, LPN   ANESTHESIA:   general  EBL:  Total I/O In: 2000 [I.V.:2000] Out: -   BLOOD ADMINISTERED:none  DRAINS: none   LOCAL MEDICATIONS USED:  MARCAINE    SPECIMEN:  Source of Specimen:  right breast, left mastectomy scar, left lateral capsule  DISPOSITION OF SPECIMEN:  PATHOLOGY  COUNTS:  YES  TOURNIQUET:  * No tourniquets in log *  DICTATION: dictated  PLAN OF CARE: Discharge to home after PACU  PATIENT DISPOSITION:  PACU - hemodynamically stable.   Delay start of Pharmacological VTE agent (>24hrs) due to surgical blood loss or risk of bleeding: no

## 2012-10-02 NOTE — Anesthesia Preprocedure Evaluation (Signed)
Anesthesia Evaluation  Patient identified by MRN, date of birth, ID band Patient awake    Reviewed: Allergy & Precautions, H&P , NPO status , Patient's Chart, lab work & pertinent test results  History of Anesthesia Complications (+) PONV  Airway Mallampati: II TM Distance: >3 FB     Dental  (+) Teeth Intact and Dental Advisory Given   Pulmonary  breath sounds clear to auscultation        Cardiovascular hypertension, Rhythm:Regular Rate:Normal     Neuro/Psych    GI/Hepatic   Endo/Other  Morbid obesity  Renal/GU      Musculoskeletal   Abdominal   Peds  Hematology   Anesthesia Other Findings   Reproductive/Obstetrics                           Anesthesia Physical Anesthesia Plan  ASA: III  Anesthesia Plan: General   Post-op Pain Management:    Induction: Intravenous  Airway Management Planned: LMA  Additional Equipment:   Intra-op Plan:   Post-operative Plan: Extubation in OR  Informed Consent: I have reviewed the patients History and Physical, chart, labs and discussed the procedure including the risks, benefits and alternatives for the proposed anesthesia with the patient or authorized representative who has indicated his/her understanding and acceptance.   Dental advisory given  Plan Discussed with: CRNA, Anesthesiologist and Surgeon  Anesthesia Plan Comments:         Anesthesia Quick Evaluation

## 2012-10-02 NOTE — Anesthesia Procedure Notes (Signed)
Procedure Name: Intubation Performed by: Zenia Resides D Pre-anesthesia Checklist: Patient identified, Timeout performed, Emergency Drugs available, Suction available and Patient being monitored Patient Re-evaluated:Patient Re-evaluated prior to inductionOxygen Delivery Method: Circle system utilized Preoxygenation: Pre-oxygenation with 100% oxygen Intubation Type: IV induction Ventilation: Mask ventilation without difficulty Laryngoscope Size: Mac and 3 Grade View: Grade I Tube type: Oral Tube size: 7.0 mm Number of attempts: 1 Placement Confirmation: ETT inserted through vocal cords under direct vision,  breath sounds checked- equal and bilateral and positive ETCO2 Secured at: 18 cm Tube secured with: Tape Dental Injury: Teeth and Oropharynx as per pre-operative assessment

## 2012-10-02 NOTE — Transfer of Care (Signed)
Immediate Anesthesia Transfer of Care Note  Patient: Janet [name]  Procedure(s) Performed: Procedure(s) (LRB) with comments: BREAST CAPSULECTOMY WITH IMPLANT EXCHANGE (Left) -  LEFT EXPANDER REMOVAL WITH CAPSULECTOMY AND PLACEMENT OF IMPLANT MASTOPEXY (Right) - RIGHT BREAST MASTOPEXY REDUCTION FOR SYMMETRY,  Patient Location: PACU  Anesthesia Type:General  Level of Consciousness: sedated  Airway & Oxygen Therapy: Patient Spontanous Breathing and Patient connected to face mask oxygen  Post-op Assessment: Report given to PACU RN and Post -op Vital signs reviewed and stable  Post vital signs: Reviewed and stable  Complications: No apparent anesthesia complications

## 2012-10-02 NOTE — Anesthesia Postprocedure Evaluation (Signed)
Anesthesia Post Note  Patient: Janet [name]  Procedure(s) Performed: Procedure(s) (LRB): BREAST CAPSULECTOMY WITH IMPLANT EXCHANGE (Left) MASTOPEXY (Right)  Anesthesia type: General  Patient location: PACU  Post pain: Pain level controlled  Post assessment: Patient's Cardiovascular Status Stable  Last Vitals:  Filed Vitals:   10/02/12 1730  BP: 138/82  Pulse: 51  Temp:   Resp: 18    Post vital signs: Reviewed and stable  Level of consciousness: alert  Complications: No apparent anesthesia complications

## 2012-10-03 ENCOUNTER — Encounter (HOSPITAL_BASED_OUTPATIENT_CLINIC_OR_DEPARTMENT_OTHER): Payer: Self-pay | Admitting: Plastic Surgery

## 2012-10-03 NOTE — Op Note (Signed)
NAME:  Janet [name], Janet [name] NO.:  [id number]  MEDICAL RECORD NO.:  [id number]  LOCATION:[facility]          FACILITY:[facility]  PHYSICIAN:  Wayland Denis, DO      DATE OF BIRTH:  [birth year]  DATE OF PROCEDURE:  10/02/2012 DATE OF DISCHARGE:                              OPERATIVE REPORT   PREOPERATIVE DIAGNOSES: 1. 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 2. Breast asymmetry with right mammary hypertrophy secondary to     postsurgical.  POSTOPERATIVE DIAGNOSES: 1. 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 2. Breast asymmetry with right mammary hypertrophy secondary to     postsurgical.  PROCEDURES: 1. Left breast expander removal with placement of implant Mentor     smooth, round high-profile 550 mL gel implant, reference number [id number]     [id number], lot number [id number], and the volume removal from the right     breast was 293 g. 2. Right breast reduction.  ATTENDING SURGEON:  Wayland Denis, DO  ASSISTANT:  Harrie Foreman, LPN  ANESTHESIA:  General.  INDICATION FOR PROCEDURE:  The patient is a 51 year old female who underwent a left breast mastectomy for breast cancer with reconstruction utilizing an expander and she was left with significant asymmetry. Risks and complications were reviewed for above-mentioned procedure and included bleeding, pain, scar, risk of anesthesia, nipple areola loss and asymmetry, capsular contracture and implant exposure.  She wished to proceed with the case.  Consent was signed and confirmed.  DESCRIPTION OF PROCEDURE:  The patient was taken to the operating room and placed on the operating room table in supine position.  General anesthesia was administered.  Once adequate, a time-out was called.  All information was confirmed to be correct.  She was prepped and draped in the usual sterile fashion.  We began on the left side and an elliptical incision was made around the incision site and the  skin from the previous mastectomy was sent for pathology.  The Bovie was then used to dissect down to the implant.  There was not a lot of soft tissue between the skin and expander.  The fluid was drained from the expander and then the expander was removed.  The pocket was irrigated with antibiotic solution.  The capsule was released in the superior, inferior and lateral direction.  The capsulectomy was done laterally in order to help medialize the pocket.  A 2-0 PDS was used to resuspend the pocket more medially to prevent the expander from going in the midaxillary region. Once that was done, the pocket was irrigated with warm saline.  The Mentor high-profile 550 mL silicone implant was selected and placed in the pocket.  There was good shape and contour.  The deep layers were then closed with reapproximation of the muscle using 3-0 Vicryl followed by 4-0 Vicryl and a running subcuticular 5-0 Monocryl was used to reapproximate the skin edges.  Dermabond was applied.  Attention was then turned to the right breast.  The patient had been marked preoperatively for an inferior pedicle technique.  The limbs were marked at the vertical region at 8 cm and then medial and lateral flaps were merged.  The 45 mm cookie cutter was utilized for the areola.  A 10- blade was used to make the incision  and to deepithelialize the inferior pedicle.  Once the inferior pedicle was deepithelialized, the Bovie was used to lift the lateral and medial flaps and those were excised.  The pocket was then dissected to the clavicle and the margins of the pectoralis muscle.  Once that was done, the pocket was irrigated.  The pedicle was thinned in order to match the opposite side, and hemostasis was achieved with electrocautery.  The patient was sat up and examined for symmetry.  The 3-0 Vicryl was used to reapproximate the fascial layers with the vertical limb to the inframammary fold.  Once few tacking stitches were  placed, the 4-0 Vicryl was utilized in a running fashion followed by 5-0 Monocryl to reapproximate the skin edges.  Once that was complete, the vertical limb was closed with 4-0 Vicryl and the 45-mm cookie cutter was utilized 5 cm from the inframammary fold, approximately 22 cm from the sternal notch for the location of bringing out the nipple areola.  That was deepithelialized and the nipple areola was brought out with 5-0 Vicryl followed by 5-0 Monocryl and the vertical limb was closed with 5-0 Monocryl.  Dermabond was applied.  The patient tolerated the procedure well.  There were no complications.  She was allowed to wake up, extubated and taken to recovery room in stable condition.     Wayland Denis, DO     CS/MEDQ  D:  10/02/2012  T:  10/03/2012  Job:  [id number]

## 2012-12-27 ENCOUNTER — Other Ambulatory Visit: Payer: Self-pay | Admitting: Plastic Surgery

## 2012-12-27 DIAGNOSIS — T8544XD Capsular contracture of breast implant, subsequent encounter: Secondary | ICD-10-CM

## 2012-12-27 NOTE — H&P (Signed)
This document contains confidential information from a [facility] medical record system and may be unauthenticated. Release may be made only with a valid authorization or in accordance with applicable policies of Medical Center or its affiliates. This document must be maintained in a secure manner or discarded/destroyed as required by Medical Center policy or by a confidential means such as shredding.   Janet [name]  12/03/2012 11:45 AM   Office Visit  MRN:  [id number]  Department: [facility]  Dept Phone: [phone]  Description: Female DOB: 1961-03-23  Provider: Wayland Denis, DO    Diagnoses -  Acquired absence of breast and absent nipple    -  Primary   V45.71     Reason for Visit -  Breast Reconstruction      Vitals - Last Recorded    181/95  62  1.664 m (5' 5.51")  139.436 kg (307 lb 6.4 oz)  50.36 kg/m2     Subjective:     Patient ID: Janet [name] is a 52 y.o. female.  HPI The patient is a 52 yrs old female here for a follow up for her left breast reconstruction after her mastectomy with expander and FlexHD placement followed by implant placement. 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 She did not have chemotherapy or radiation. The diagnosis of the breast cancer was pursued after months of left nipple discharge. She was a 47C bra prior to surgery. She has a good shape and would like to have a mastopexy on the right to match when we do the expander exchange. She had a high profile 550 cc silicone placed.  She is concerned about the lateral rotation of the implant and inferior positioning. This is causing displacement in the bra on a continual basis.  The following portions of the patient's history were reviewed and updated as appropriate: allergies, current medications, past family history, past medical history, past social history, past surgical history and  problem list.  Review of Systems  All other systems reviewed and are negative.  Objective:     Physical Exam  Constitutional: She appears well-developed and well-nourished.  HENT:   Head: Normocephalic and atraumatic.  Eyes: Conjunctivae normal and EOM are normal. Pupils are equal, round, and reactive to light.  Cardiovascular: Normal rate.   Pulmonary/Chest: Effort normal.  Neurological: She is alert.  Skin: Skin is warm.  Psychiatric: She has a normal mood and affect. Her behavior is normal. Judgment and thought content normal.      Assessment:      1.  Acquired absence of breast and absent nipple        Plan:     She would benefit from a capsuloraphy to reposition the inframammary fold and prevent lateral displacement.    Continue massage and sports bra

## 2013-01-13 ENCOUNTER — Encounter (HOSPITAL_BASED_OUTPATIENT_CLINIC_OR_DEPARTMENT_OTHER): Payer: Self-pay | Admitting: *Deleted

## 2013-01-13 NOTE — Progress Notes (Signed)
Pt has had surgery 4x last yr Will come in for bmet and new ekg

## 2013-01-16 ENCOUNTER — Encounter (HOSPITAL_BASED_OUTPATIENT_CLINIC_OR_DEPARTMENT_OTHER): Payer: Self-pay | Admitting: Anesthesiology

## 2013-01-16 ENCOUNTER — Encounter (HOSPITAL_COMMUNITY): Payer: Self-pay | Admitting: *Deleted

## 2013-01-16 ENCOUNTER — Encounter (HOSPITAL_BASED_OUTPATIENT_CLINIC_OR_DEPARTMENT_OTHER)
Admission: RE | Disposition: A | Discharge status: Home / Self Care | Payer: Self-pay | Source: Ambulatory Visit | Attending: Plastic Surgery

## 2013-01-16 ENCOUNTER — Encounter (HOSPITAL_COMMUNITY): Payer: Self-pay | Admitting: Pharmacy Technician

## 2013-01-16 ENCOUNTER — Ambulatory Visit (HOSPITAL_BASED_OUTPATIENT_CLINIC_OR_DEPARTMENT_OTHER)
Admission: RE | Admit: 2013-01-16 | Discharge: 2013-01-16 | Disposition: A | Discharge status: Home / Self Care | Payer: Medicaid Other | Source: Ambulatory Visit | Attending: Plastic Surgery | Admitting: Plastic Surgery

## 2013-01-16 ENCOUNTER — Encounter (HOSPITAL_BASED_OUTPATIENT_CLINIC_OR_DEPARTMENT_OTHER): Payer: Self-pay | Admitting: Plastic Surgery

## 2013-01-16 DIAGNOSIS — N650 Deformity of reconstructed breast: Secondary | ICD-10-CM | POA: Insufficient documentation

## 2013-01-16 DIAGNOSIS — Z9010 Acquired absence of unspecified breast and nipple: Secondary | ICD-10-CM | POA: Insufficient documentation

## 2013-01-16 DIAGNOSIS — T8544XD Capsular contracture of breast implant, subsequent encounter: Secondary | ICD-10-CM

## 2013-01-16 DIAGNOSIS — Z853 Personal history of malignant neoplasm of breast: Secondary | ICD-10-CM | POA: Insufficient documentation

## 2013-01-16 DIAGNOSIS — Z538 Procedure and treatment not carried out for other reasons: Secondary | ICD-10-CM | POA: Insufficient documentation

## 2013-01-16 SURGERY — CANCELLED PROCEDURE
Site: Breast | Laterality: Left

## 2013-01-16 MED ORDER — LACTATED RINGERS IV SOLN: INTRAVENOUS | Status: DC

## 2013-01-16 MED ORDER — MIDAZOLAM HCL 2 MG/2ML IJ SOLN: 1.0000 mg | INTRAMUSCULAR | Status: DC | PRN

## 2013-01-16 MED ORDER — FENTANYL CITRATE 0.05 MG/ML IJ SOLN: 50.0000 ug | INTRAMUSCULAR | Status: DC | PRN

## 2013-01-16 MED ORDER — CIPROFLOXACIN IN D5W 400 MG/200ML IV SOLN: 400.0000 mg | INTRAVENOUS | Status: DC

## 2013-01-16 SURGICAL SUPPLY — 59 items
BAG DECANTER FOR FLEXI CONT (MISCELLANEOUS) IMPLANT
BANDAGE GAUZE ELAST BULKY 4 IN (GAUZE/BANDAGES/DRESSINGS) IMPLANT
BINDER BREAST LRG (GAUZE/BANDAGES/DRESSINGS) IMPLANT
BINDER BREAST MEDIUM (GAUZE/BANDAGES/DRESSINGS) IMPLANT
BINDER BREAST XLRG (GAUZE/BANDAGES/DRESSINGS) IMPLANT
BINDER BREAST XXLRG (GAUZE/BANDAGES/DRESSINGS) IMPLANT
BLADE HEX COATED 2.75 (ELECTRODE) IMPLANT
BLADE SURG 15 STRL LF DISP TIS (BLADE) IMPLANT
BLADE SURG 15 STRL SS (BLADE)
CANISTER SUCTION 1200CC (MISCELLANEOUS) IMPLANT
CHLORAPREP W/TINT 26ML (MISCELLANEOUS) IMPLANT
CLOTH BEACON ORANGE TIMEOUT ST (SAFETY) IMPLANT
COVER MAYO STAND STRL (DRAPES) IMPLANT
COVER TABLE BACK 60X90 (DRAPES) IMPLANT
DECANTER SPIKE VIAL GLASS SM (MISCELLANEOUS) IMPLANT
DERMABOND ADVANCED (GAUZE/BANDAGES/DRESSINGS)
DERMABOND ADVANCED .7 DNX12 (GAUZE/BANDAGES/DRESSINGS) IMPLANT
DRAIN CHANNEL 19F RND (DRAIN) IMPLANT
DRAPE LAPAROSCOPIC ABDOMINAL (DRAPES) IMPLANT
DRSG PAD ABDOMINAL 8X10 ST (GAUZE/BANDAGES/DRESSINGS) IMPLANT
ELECT BLADE 4.0 EZ CLEAN MEGAD (MISCELLANEOUS)
ELECT BLADE 6.5 .24CM SHAFT (ELECTRODE) IMPLANT
ELECT REM PT RETURN 9FT ADLT (ELECTROSURGICAL)
ELECTRODE BLDE 4.0 EZ CLN MEGD (MISCELLANEOUS) IMPLANT
ELECTRODE REM PT RTRN 9FT ADLT (ELECTROSURGICAL) IMPLANT
EVACUATOR SILICONE 100CC (DRAIN) IMPLANT
GAUZE SPONGE 4X4 12PLY STRL LF (GAUZE/BANDAGES/DRESSINGS) IMPLANT
GLOVE BIO SURGEON STRL SZ 6.5 (GLOVE) IMPLANT
GOWN PREVENTION PLUS XLARGE (GOWN DISPOSABLE) IMPLANT
IV NS 500ML (IV SOLUTION)
IV NS 500ML BAXH (IV SOLUTION) IMPLANT
KIT FILL MCGHAN 30CC (MISCELLANEOUS) IMPLANT
NEEDLE HYPO 25X1 1.5 SAFETY (NEEDLE) IMPLANT
NS IRRIG 1000ML POUR BTL (IV SOLUTION) IMPLANT
PACK BASIN DAY SURGERY FS (CUSTOM PROCEDURE TRAY) IMPLANT
PENCIL BUTTON HOLSTER BLD 10FT (ELECTRODE) IMPLANT
PIN SAFETY STERILE (MISCELLANEOUS) IMPLANT
SLEEVE SCD COMPRESS KNEE MED (MISCELLANEOUS) IMPLANT
SPONGE GAUZE 4X4 12PLY (GAUZE/BANDAGES/DRESSINGS) IMPLANT
SPONGE LAP 18X18 X RAY DECT (DISPOSABLE) IMPLANT
STAPLER VISISTAT 35W (STAPLE) IMPLANT
STRIP CLOSURE SKIN 1/2X4 (GAUZE/BANDAGES/DRESSINGS) IMPLANT
SUT MNCRL AB 3-0 PS2 18 (SUTURE) IMPLANT
SUT MON AB 5-0 PS2 18 (SUTURE) IMPLANT
SUT PDS AB 2-0 CT2 27 (SUTURE) IMPLANT
SUT VIC AB 3-0 FS2 27 (SUTURE) IMPLANT
SUT VIC AB 3-0 PS1 18 (SUTURE)
SUT VIC AB 3-0 PS1 18XBRD (SUTURE) IMPLANT
SUT VIC AB 3-0 SH 27 (SUTURE)
SUT VIC AB 3-0 SH 27X BRD (SUTURE) IMPLANT
SUT VIC AB 5-0 PS2 18 (SUTURE) IMPLANT
SUT VICRYL 4-0 PS2 18IN ABS (SUTURE) IMPLANT
SYR 50ML LL SCALE MARK (SYRINGE) IMPLANT
SYR BULB IRRIGATION 50ML (SYRINGE) IMPLANT
SYR CONTROL 10ML LL (SYRINGE) IMPLANT
TOWEL OR 17X24 6PK STRL BLUE (TOWEL DISPOSABLE) IMPLANT
TUBE CONNECTING 20X1/4 (TUBING) IMPLANT
UNDERPAD 30X30 INCONTINENT (UNDERPADS AND DIAPERS) IMPLANT
YANKAUER SUCT BULB TIP NO VENT (SUCTIONS) IMPLANT

## 2013-01-16 NOTE — Interval H&P Note (Signed)
History and Physical Interval Note:  Unable to obtain vascular access so will reschedule for main OR  01/16/2013 9:43 AM SANGER,Danis Pembleton

## 2013-01-16 NOTE — Interval H&P Note (Signed)
History and Physical Interval Note:  01/16/2013 9:28 AM  Janet [name]  has presented today for surgery, with the diagnosis of capsulectomy of left breast  The various methods of treatment have been discussed with the patient and family. After consideration of risks, benefits and other options for treatment, the patient has consented to  Procedure(s): CAPSULECTOMY OF LEFT BREAST AND CAPSULORAPHY  (Left) as a surgical intervention .  The patient's history has been reviewed, patient examined, no change in status, stable for surgery.  I have reviewed the patient's chart and labs.  Questions were answered to the patient's satisfaction.     SANGER,Shaunna Rosetti

## 2013-01-16 NOTE — H&P (View-Only) (Signed)
This document contains confidential information from a [facility] medical record system and may be unauthenticated. Release may be made only with a valid authorization or in accordance with applicable policies of Medical Center or its affiliates. This document must be maintained in a secure manner or discarded/destroyed as required by Medical Center policy or by a confidential means such as shredding.   Janet [name]  12/03/2012 11:45 AM   Office Visit  MRN:  [id number]  Department: [facility]  Dept Phone: [phone]  Description: Female DOB: 09-18-1961  Provider: Wayland Denis, DO    Diagnoses -  Acquired absence of breast and absent nipple    -  Primary   V45.71     Reason for Visit -  Breast Reconstruction      Vitals - Last Recorded    181/95  62  1.664 m (5' 5.51")  139.436 kg (307 lb 6.4 oz)  50.36 kg/m2     Subjective:     Patient ID: Janet [name] is a 52 y.o. female.  HPI The patient is a 52 yrs old female here for a follow up for her left breast reconstruction after her mastectomy with expander and FlexHD placement followed by implant placement. 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 She did not have chemotherapy or radiation. The diagnosis of the breast cancer was pursued after months of left nipple discharge. She was a 47C bra prior to surgery. She has a good shape and would like to have a mastopexy on the right to match when we do the expander exchange. She had a high profile 550 cc silicone placed.  She is concerned about the lateral rotation of the implant and inferior positioning. This is causing displacement in the bra on a continual basis.  The following portions of the patient's history were reviewed and updated as appropriate: allergies, current medications, past family history, past medical history, past social history, past surgical history and  problem list.  Review of Systems  All other systems reviewed and are negative.  Objective:     Physical Exam  Constitutional: She appears well-developed and well-nourished.  HENT:   Head: Normocephalic and atraumatic.  Eyes: Conjunctivae normal and EOM are normal. Pupils are equal, round, and reactive to light.  Cardiovascular: Normal rate.   Pulmonary/Chest: Effort normal.  Neurological: She is alert.  Skin: Skin is warm.  Psychiatric: She has a normal mood and affect. Her behavior is normal. Judgment and thought content normal.      Assessment:      1.  Acquired absence of breast and absent nipple        Plan:     She would benefit from a capsuloraphy to reposition the inframammary fold and prevent lateral displacement.    Continue massage and sports bra

## 2013-01-16 NOTE — Anesthesia Preprocedure Evaluation (Deleted)
Anesthesia Evaluation  Patient identified by MRN, date of birth, ID band Patient awake    Reviewed: Allergy & Precautions, H&P , NPO status , Patient's Chart, lab work & pertinent test results  History of Anesthesia Complications (+) PONV  Airway Mallampati: II TM Distance: >3 FB Neck ROM: Full    Dental no notable dental hx. (+) Partial Upper and Dental Advisory Given   Pulmonary neg pulmonary ROS, resolved,  breath sounds clear to auscultation  Pulmonary exam normal       Cardiovascular hypertension, On Medications Rhythm:Regular Rate:Normal     Neuro/Psych PSYCHIATRIC DISORDERS negative neurological ROS     GI/Hepatic negative GI ROS, Neg liver ROS,   Endo/Other  Morbid obesity  Renal/GU negative Renal ROS  negative genitourinary   Musculoskeletal   Abdominal (+) + obese,   Peds  Hematology negative hematology ROS (+)   Anesthesia Other Findings   Reproductive/Obstetrics negative OB ROS                           Anesthesia Physical Anesthesia Plan  ASA: III  Anesthesia Plan: General   Post-op Pain Management:    Induction: Intravenous  Airway Management Planned: Oral ETT and LMA  Additional Equipment:   Intra-op Plan:   Post-operative Plan: Extubation in OR  Informed Consent: I have reviewed the patients History and Physical, chart, labs and discussed the procedure including the risks, benefits and alternatives for the proposed anesthesia with the patient or authorized representative who has indicated his/her understanding and acceptance.   Dental advisory given  Plan Discussed with: CRNA  Anesthesia Plan Comments: (Case cancelled due to no venous access. Attempted PIV on upper extremities without success. Looked with U/S for EJ-unable to locate. AC bilaterally with no veins seen by U/S. D/w pt. and Dr. Kelly Splinter that a central line is needed and that should be done at the  main hospital.)       Anesthesia Quick Evaluation

## 2013-01-16 NOTE — Progress Notes (Signed)
Pt denies SOB, chest pain, and current treatment by a cardiologist.

## 2013-01-20 ENCOUNTER — Other Ambulatory Visit: Payer: Self-pay | Admitting: Plastic Surgery

## 2013-01-20 ENCOUNTER — Encounter (HOSPITAL_COMMUNITY): Payer: Self-pay | Admitting: Surgery

## 2013-01-20 ENCOUNTER — Ambulatory Visit (HOSPITAL_COMMUNITY): Payer: Medicaid Other | Admitting: Anesthesiology

## 2013-01-20 ENCOUNTER — Encounter (HOSPITAL_COMMUNITY)
Admission: RE | Disposition: A | Discharge status: Home / Self Care | Payer: Self-pay | Source: Ambulatory Visit | Attending: Plastic Surgery

## 2013-01-20 ENCOUNTER — Ambulatory Visit (HOSPITAL_COMMUNITY)
Admission: RE | Admit: 2013-01-20 | Discharge: 2013-01-20 | Disposition: A | Discharge status: Home / Self Care | Payer: Medicaid Other | Source: Ambulatory Visit | Attending: Plastic Surgery | Admitting: Plastic Surgery

## 2013-01-20 ENCOUNTER — Encounter (HOSPITAL_COMMUNITY): Payer: Self-pay | Admitting: Anesthesiology

## 2013-01-20 DIAGNOSIS — I10 Essential (primary) hypertension: Secondary | ICD-10-CM | POA: Insufficient documentation

## 2013-01-20 DIAGNOSIS — Z853 Personal history of malignant neoplasm of breast: Secondary | ICD-10-CM | POA: Insufficient documentation

## 2013-01-20 DIAGNOSIS — N650 Deformity of reconstructed breast: Secondary | ICD-10-CM | POA: Insufficient documentation

## 2013-01-20 DIAGNOSIS — F3289 Other specified depressive episodes: Secondary | ICD-10-CM | POA: Insufficient documentation

## 2013-01-20 DIAGNOSIS — Z9012 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: Secondary | ICD-10-CM

## 2013-01-20 DIAGNOSIS — F329 Major depressive disorder, single episode, unspecified: Secondary | ICD-10-CM | POA: Insufficient documentation

## 2013-01-20 DIAGNOSIS — F411 Generalized anxiety disorder: Secondary | ICD-10-CM | POA: Insufficient documentation

## 2013-01-20 DIAGNOSIS — N651 Disproportion of reconstructed breast: Secondary | ICD-10-CM | POA: Insufficient documentation

## 2013-01-20 DIAGNOSIS — Z8701 Personal history of pneumonia (recurrent): Secondary | ICD-10-CM | POA: Insufficient documentation

## 2013-01-20 DIAGNOSIS — Z9010 Acquired absence of unspecified breast and nipple: Secondary | ICD-10-CM | POA: Insufficient documentation

## 2013-01-20 HISTORY — PX: BREAST REDUCTION SURGERY: SHX8

## 2013-01-20 HISTORY — PX: BREAST IMPLANT EXCHANGE: SHX6296

## 2013-01-20 LAB — BASIC METABOLIC PANEL
Calcium: 9.1 mg/dL (ref 8.4–10.5)
GFR calc Af Amer: >90 mL/min (ref >90)
GFR calc non Af Amer: >90 mL/min (ref >90)
Glucose, Bld: 101 mg/dL — ABNORMAL HIGH (ref 70–99)
Potassium: 4.3 mEq/L (ref 3.5–5.1)
Sodium: 141 mEq/L (ref 135–145)

## 2013-01-20 LAB — SURGICAL PCR SCREEN
MRSA, PCR: NEGATIVE
Staphylococcus aureus: NEGATIVE

## 2013-01-20 LAB — CBC
Hemoglobin: 14.1 g/dL (ref 12.0–15.0)
Platelets: 252 10*3/uL (ref 150–400)
RBC: 4.58 MIL/uL (ref 3.87–5.11)
WBC: 8.1 10*3/uL (ref 4.0–10.5)

## 2013-01-20 SURGERY — REPLACEMENT, IMPLANT, BREAST
Anesthesia: General | Site: Breast | Wound class: Clean

## 2013-01-20 MED ORDER — DEXAMETHASONE SODIUM PHOSPHATE 4 MG/ML IJ SOLN
INTRAMUSCULAR | Status: DC | PRN
  Administered 2013-01-20: 4 mg via INTRAVENOUS

## 2013-01-20 MED ORDER — CIPROFLOXACIN IN D5W 400 MG/200ML IV SOLN
400.0000 mg | INTRAVENOUS | Status: AC
  Administered 2013-01-20: 400 mg via INTRAVENOUS
  Filled 2013-01-20: qty 200

## 2013-01-20 MED ORDER — GLYCOPYRROLATE 0.2 MG/ML IJ SOLN
INTRAMUSCULAR | Status: DC | PRN
  Administered 2013-01-20: 0.6 mg via INTRAVENOUS

## 2013-01-20 MED ORDER — LIDOCAINE HCL 4 % MT SOLN
OROMUCOSAL | Status: DC | PRN
  Administered 2013-01-20: 4 mL via TOPICAL

## 2013-01-20 MED ORDER — SODIUM BICARBONATE 4 % IV SOLN
Freq: Once | INTRAVENOUS | Status: AC
  Administered 2013-01-20: 16:00:00 via INTRAMUSCULAR
  Filled 2013-01-20: qty 50

## 2013-01-20 MED ORDER — HYDROMORPHONE HCL PF 1 MG/ML IJ SOLN: 0.2500 mg | INTRAMUSCULAR | Status: DC | PRN

## 2013-01-20 MED ORDER — ROCURONIUM BROMIDE 100 MG/10ML IV SOLN
INTRAVENOUS | Status: DC | PRN
  Administered 2013-01-20: 40 mg via INTRAVENOUS

## 2013-01-20 MED ORDER — ACETAMINOPHEN 10 MG/ML IV SOLN
1000.0000 mg | Freq: Once | INTRAVENOUS | Status: AC
  Administered 2013-01-20: 1000 mg via INTRAVENOUS
  Filled 2013-01-20: qty 100

## 2013-01-20 MED ORDER — MIDAZOLAM HCL 5 MG/5ML IJ SOLN
INTRAMUSCULAR | Status: DC | PRN
  Administered 2013-01-20: 2 mg via INTRAVENOUS

## 2013-01-20 MED ORDER — HYDROMORPHONE HCL PF 1 MG/ML IJ SOLN
INTRAMUSCULAR | Status: AC
  Administered 2013-01-20: 0.25 mg via INTRAVENOUS
  Filled 2013-01-20: qty 1

## 2013-01-20 MED ORDER — MUPIROCIN 2 % EX OINT
TOPICAL_OINTMENT | CUTANEOUS | Status: AC
  Administered 2013-01-20: 1 via NASAL
  Filled 2013-01-20: qty 22

## 2013-01-20 MED ORDER — EPHEDRINE SULFATE 50 MG/ML IJ SOLN
INTRAMUSCULAR | Status: DC | PRN
  Administered 2013-01-20: 5 mg via INTRAVENOUS

## 2013-01-20 MED ORDER — NEOSTIGMINE METHYLSULFATE 1 MG/ML IJ SOLN
INTRAMUSCULAR | Status: DC | PRN
  Administered 2013-01-20: 4 mg via INTRAVENOUS

## 2013-01-20 MED ORDER — FENTANYL CITRATE 0.05 MG/ML IJ SOLN
INTRAMUSCULAR | Status: DC | PRN
  Administered 2013-01-20: 50 ug via INTRAVENOUS
  Administered 2013-01-20: 150 ug via INTRAVENOUS

## 2013-01-20 MED ORDER — LIDOCAINE HCL (CARDIAC) 20 MG/ML IV SOLN
INTRAVENOUS | Status: DC | PRN
  Administered 2013-01-20: 100 mg via INTRAVENOUS

## 2013-01-20 MED ORDER — ACETAMINOPHEN 10 MG/ML IV SOLN
INTRAVENOUS | Status: AC
  Filled 2013-01-20: qty 100

## 2013-01-20 MED ORDER — LACTATED RINGERS IV SOLN
INTRAVENOUS | Status: DC
  Administered 2013-01-20: 15:00:00 via INTRAVENOUS

## 2013-01-20 MED ORDER — LIDOCAINE-EPINEPHRINE (PF) 1 %-1:200000 IJ SOLN
INTRAMUSCULAR | Status: AC
  Filled 2013-01-20: qty 10

## 2013-01-20 MED ORDER — SUCCINYLCHOLINE CHLORIDE 20 MG/ML IJ SOLN
INTRAMUSCULAR | Status: DC | PRN
  Administered 2013-01-20: 100 mg via INTRAVENOUS

## 2013-01-20 MED ORDER — PROPOFOL 10 MG/ML IV BOLUS
INTRAVENOUS | Status: DC | PRN
  Administered 2013-01-20: 280 mg via INTRAVENOUS

## 2013-01-20 MED ORDER — BACITRACIN ZINC 500 UNIT/GM EX OINT
TOPICAL_OINTMENT | CUTANEOUS | Status: DC | PRN
  Administered 2013-01-20: 1 via TOPICAL

## 2013-01-20 MED ORDER — MUPIROCIN 2 % EX OINT
TOPICAL_OINTMENT | Freq: Two times a day (BID) | CUTANEOUS | Status: DC
  Filled 2013-01-20: qty 22

## 2013-01-20 MED ORDER — ONDANSETRON HCL 4 MG/2ML IJ SOLN
INTRAMUSCULAR | Status: DC | PRN
  Administered 2013-01-20: 4 mg via INTRAVENOUS

## 2013-01-20 MED ORDER — LACTATED RINGERS IV SOLN
INTRAVENOUS | Status: DC | PRN
  Administered 2013-01-20 (×2): via INTRAVENOUS

## 2013-01-20 MED ORDER — BACITRACIN ZINC 500 UNIT/GM EX OINT
TOPICAL_OINTMENT | CUTANEOUS | Status: AC
  Filled 2013-01-20: qty 15

## 2013-01-20 SURGICAL SUPPLY — 46 items
BAG DECANTER FOR FLEXI CONT (MISCELLANEOUS) ×3 IMPLANT
BANDAGE ELASTIC 6 VELCRO ST LF (GAUZE/BANDAGES/DRESSINGS) IMPLANT
BINDER ABD UNIV 12 45-62 (WOUND CARE) ×4 IMPLANT
BINDER ABDOMINAL 46IN 62IN (WOUND CARE) ×6
BINDER BREAST XXLRG (GAUZE/BANDAGES/DRESSINGS) ×3 IMPLANT
BIOPATCH RED 1 DISK 7.0 (GAUZE/BANDAGES/DRESSINGS) ×6 IMPLANT
CANISTER SUCTION 2500CC (MISCELLANEOUS) ×3 IMPLANT
CHLORAPREP W/TINT 26ML (MISCELLANEOUS) ×3 IMPLANT
CLOTH BEACON ORANGE TIMEOUT ST (SAFETY) ×3 IMPLANT
CONNECTOR 5 IN 1 STRAIGHT STRL (MISCELLANEOUS) ×3 IMPLANT
COVER SURGICAL LIGHT HANDLE (MISCELLANEOUS) ×6 IMPLANT
DRAIN CHANNEL 19F RND (DRAIN) IMPLANT
DRAPE LAPAROSCOPIC ABDOMINAL (DRAPES) ×3 IMPLANT
DRSG PAD ABDOMINAL 8X10 ST (GAUZE/BANDAGES/DRESSINGS) ×6 IMPLANT
ELECT BLADE 4.0 EZ CLEAN MEGAD (MISCELLANEOUS) ×3
ELECT REM PT RETURN 9FT ADLT (ELECTROSURGICAL) ×3
ELECTRODE BLDE 4.0 EZ CLN MEGD (MISCELLANEOUS) ×2 IMPLANT
ELECTRODE REM PT RTRN 9FT ADLT (ELECTROSURGICAL) ×2 IMPLANT
EVACUATOR SILICONE 100CC (DRAIN) IMPLANT
GLOVE BIO SURGEON STRL SZ 6.5 (GLOVE) ×6 IMPLANT
GOWN STRL NON-REIN LRG LVL3 (GOWN DISPOSABLE) ×9 IMPLANT
KIT BASIN OR (CUSTOM PROCEDURE TRAY) ×3 IMPLANT
KIT ROOM TURNOVER OR (KITS) ×3 IMPLANT
MARKER SKIN DUAL TIP RULER LAB (MISCELLANEOUS) ×3 IMPLANT
NEEDLE SPNL 18GX3.5 QUINCKE PK (NEEDLE) ×3 IMPLANT
NS IRRIG 1000ML POUR BTL (IV SOLUTION) ×3 IMPLANT
PACK GENERAL/GYN (CUSTOM PROCEDURE TRAY) ×3 IMPLANT
PAD ARMBOARD 7.5X6 YLW CONV (MISCELLANEOUS) ×6 IMPLANT
SPONGE GAUZE 4X4 12PLY (GAUZE/BANDAGES/DRESSINGS) IMPLANT
SPONGE NEURO XRAY DETECT 1X3 (DISPOSABLE) ×3 IMPLANT
STAPLER VISISTAT 35W (STAPLE) ×3 IMPLANT
STOPCOCK 4 WAY LG BORE MALE ST (IV SETS) ×3 IMPLANT
SUT MNCRL AB 3-0 PS2 18 (SUTURE) ×12 IMPLANT
SUT MON AB 5-0 PS2 18 (SUTURE) ×3 IMPLANT
SUT PDS AB 2-0 CT1 27 (SUTURE) ×9 IMPLANT
SUT PROLENE 3 0 PS 2 (SUTURE) ×6 IMPLANT
SUT VIC AB 3-0 SH 18 (SUTURE) ×6 IMPLANT
SUT VIC AB 3-0 SH 8-18 (SUTURE) ×3 IMPLANT
SUT VICRYL 4-0 PS2 18IN ABS (SUTURE) ×3 IMPLANT
SYR 50ML LL SCALE MARK (SYRINGE) ×3 IMPLANT
SYRINGE 10CC LL (SYRINGE) ×24 IMPLANT
TOWEL OR 17X24 6PK STRL BLUE (TOWEL DISPOSABLE) ×3 IMPLANT
TOWEL OR 17X26 10 PK STRL BLUE (TOWEL DISPOSABLE) ×3 IMPLANT
TRAP SPECIMEN MUCOUS 40CC (MISCELLANEOUS) ×6 IMPLANT
TUBE EXTENSION 20 4429 (MISCELLANEOUS) ×6 IMPLANT
WATER STERILE IRR 1000ML POUR (IV SOLUTION) IMPLANT

## 2013-01-20 NOTE — Transfer of Care (Signed)
Immediate Anesthesia Transfer of Care Note  Patient: Janet [name]  Procedure(s) Performed: Procedure(s): REVISION OF LEFT BREAST  IMPLANT  (Left) Abdominal fat graft to breast reconstruction area (N/A)  Patient Location: PACU  Anesthesia Type:General  Level of Consciousness: awake, alert  and oriented  Airway & Oxygen Therapy: Patient Spontanous Breathing and Patient connected to nasal cannula oxygen  Post-op Assessment: Report given to PACU RN, Post -op Vital signs reviewed and stable and Patient moving all extremities X 4  Post vital signs: Reviewed and stable  Complications: No apparent anesthesia complications

## 2013-01-20 NOTE — H&P (View-Only) (Signed)
This document contains confidential information from a [facility] medical record system and may be unauthenticated. Release may be made only with a valid authorization or in accordance with applicable policies of Medical Center or its affiliates. This document must be maintained in a secure manner or discarded/destroyed as required by Medical Center policy or by a confidential means such as shredding.   Janet [name]  12/03/2012 11:45 AM   Office Visit  MRN:  [id number]  Department: [facility]  Dept Phone: [phone]  Description: Female DOB: 01/09/1961  Provider: Claire Sanger, DO    Diagnoses -  Acquired absence of breast and absent nipple    -  Primary   V45.71     Reason for Visit -  Breast Reconstruction      Vitals - Last Recorded    181/95  62  1.664 m (5' 5.51")  139.436 kg (307 lb 6.4 oz)  50.36 kg/m2     Subjective:     Patient ID: Janet [name] is a 52 y.o. female.  HPI The patient is a 52 yrs old female here for a follow up for her left breast reconstruction after her mastectomy with expander and FlexHD placement followed by implant placement. 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 The tumor was ER/PR positive, HER-2 negative, and negative SLN. She did not have chemotherapy or radiation. The diagnosis of the breast cancer was pursued after months of left nipple discharge. She was a 47C bra prior to surgery. She has a good shape and would like to have a mastopexy on the right to match when we do the expander exchange. She had a high profile 550 cc silicone placed.  She is concerned about the lateral rotation of the implant and inferior positioning. This is causing displacement in the bra on a continual basis.  The following portions of the patient's history were reviewed and updated as appropriate: allergies, current medications, past family history, past medical history, past social history, past surgical history and  problem list.  Review of Systems  All other systems reviewed and are negative.  Objective:     Physical Exam  Constitutional: She appears well-developed and well-nourished.  HENT:   Head: Normocephalic and atraumatic.  Eyes: Conjunctivae normal and EOM are normal. Pupils are equal, round, and reactive to light.  Cardiovascular: Normal rate.   Pulmonary/Chest: Effort normal.  Neurological: She is alert.  Skin: Skin is warm.  Psychiatric: She has a normal mood and affect. Her behavior is normal. Judgment and thought content normal.      Assessment:      1.  Acquired absence of breast and absent nipple        Plan:     She would benefit from a capsuloraphy to reposition the inframammary fold and prevent lateral displacement.    Continue massage and sports bra         

## 2013-01-20 NOTE — Op Note (Signed)
Op report    SURGICAL DIVISION: Plastic Surgery  PREOPERATIVE DIAGNOSES:  1. History of breast cancer.  2. Acquired absence of left breast.   POSTOPERATIVE DIAGNOSES:  1. History of breast cancer.  2. Acquired absence of left breast.   PROCEDURE:  1. Left breast reconstruction revision with repositioning of the implant with lipofilling.  SURGEON: Wayland Denis, DO  ASSISTANT: Shawn Rayburn, PA   ANESTHESIA:  General.   COMPLICATIONS: None.   INDICATIONS FOR PROCEDURE:  The patient has a history of left mastectomy and reconstruction with expander and then implant. She now presents for revision due to asymmetry.     CONSENT:  Informed consent was obtained directly from the patient. Risks, benefits and alternatives were fully discussed. Specific risks including but not limited to bleeding, infection, hematoma, seroma, scarring, pain, implant infection, implant extrusion, capsular contracture, asymmetry, wound healing problems, and need for further surgery were all discussed. The patient did have an ample opportunity to have her questions answered to her satisfaction.   DESCRIPTION OF PROCEDURE:  The patient was taken to the operating room. SCDs were placed and IV antibiotics were given. The patient's chest and abdomen were prepped and draped in a sterile fashion. A time out was performed and all information was confirmed to be correct.  Tummescent was infused into the abdomen subcutaneous tissue.   A #15 blade was used to make an incision at the left breast area.  The capsule was opened and the implant removed.  The bovie was used to open the capsule at the inframammary fold.   The capsule was repositioned more superiorly for better positioning. The muscle was re-approximated with 3-0 vicryl followed by 4-0 vicryl and the skin was re-approximated with 5-0 monocryl.   The adipose was removed from the abdominal area, cleaned and injected into the left medial breast area for contouring.   Sterile dressings were applied.  A bra and abdominal binder were placed. The patient was allowed to wake up from anesthesia and taken to the recovery room in satisfactory condition.

## 2013-01-20 NOTE — Preoperative (Signed)
Beta Blockers   Reason not to administer Beta Blockers:Not Applicable 

## 2013-01-20 NOTE — Anesthesia Postprocedure Evaluation (Signed)
  Anesthesia Post-op Note  Patient: Janet [name]  Procedure(s) Performed: Procedure(s): REVISION OF LEFT BREAST  IMPLANT  (Left) Abdominal fat graft to breast reconstruction area (N/A)  Patient Location: PACU  Anesthesia Type:General  Level of Consciousness: awake  Airway and Oxygen Therapy: Patient Spontanous Breathing  Post-op Pain: mild  Post-op Assessment: Post-op Vital signs reviewed  Post-op Vital Signs: Reviewed  Complications: No apparent anesthesia complications

## 2013-01-20 NOTE — Anesthesia Procedure Notes (Signed)
Procedure Name: Intubation Date/Time: 01/20/2013 3:01 PM Performed by: Lovie Chol Pre-anesthesia Checklist: Patient identified, Emergency Drugs available, Suction available, Patient being monitored and Timeout performed Patient Re-evaluated:Patient Re-evaluated prior to inductionOxygen Delivery Method: Circle system utilized Preoxygenation: Pre-oxygenation with 100% oxygen Intubation Type: IV induction Ventilation: Mask ventilation without difficulty and Oral airway inserted - appropriate to patient size Laryngoscope Size: Miller and 2 Grade View: Grade I Tube type: Oral Tube size: 7.5 mm Number of attempts: 1 Airway Equipment and Method: Stylet and LTA kit utilized Placement Confirmation: ETT inserted through vocal cords under direct vision,  positive ETCO2,  CO2 detector and breath sounds checked- equal and bilateral Secured at: 21 cm Tube secured with: Tape Dental Injury: Teeth and Oropharynx as per pre-operative assessment

## 2013-01-20 NOTE — Interval H&P Note (Signed)
History and Physical Interval Note:  01/20/2013 2:28 PM  Janet [name]  has presented today for surgery, with the diagnosis of LEFT BREAST ACQUIRED ABSCENSE OF BREAST AND NIPPLE AND PERSONAL HISTORY OF BREAST CANCER  The various methods of treatment have been discussed with the patient and family. After consideration of risks, benefits and other options for treatment, the patient has consented to  Procedure(s): REVISION OF LEFT BREAST WITH REMOVAL AND REPLACEMENT OF IMPLANT  (Left) Abdominal fat graft to breast reconstruction area (N/A) as a surgical intervention .  The patient's history has been reviewed, patient examined, no change in status, stable for surgery.  I have reviewed the patient's chart and labs.  Questions were answered to the patient's satisfaction.     SANGER,CLAIRE

## 2013-01-20 NOTE — Brief Op Note (Addendum)
01/20/2013  5:19 PM  PATIENT:  Janet [name]  52 y.o. female  PRE-OPERATIVE DIAGNOSIS:  LEFT BREAST ACQUIRED ABSCENSE OF BREAST AND NIPPLE AND PERSONAL HISTORY OF BREAST CANCER  POST-OPERATIVE DIAGNOSIS:  same  PROCEDURE:  Procedure(s): REVISION OF LEFT BREAST  IMPLANT  (Left) Abdominal fat graft to breast reconstruction area (N/A)  SURGEON:  Surgeon(s) and Role:    * Kayona Foor Sanger, DO - Primary  PHYSICIAN ASSISTANT: Shawn Rayburn, PA  ASSISTANTS: none   ANESTHESIA:   general  EBL:  Total I/O In: 1600 [I.V.:1600] Out: -   BLOOD ADMINISTERED:none  DRAINS: none   LOCAL MEDICATIONS USED:  LIDOCAINE   SPECIMEN:  No Specimen  DISPOSITION OF SPECIMEN:  N/A  COUNTS:  YES  TOURNIQUET:  * No tourniquets in log *  DICTATION: .Dragon Dictation  PLAN OF CARE: Discharge to home after PACU  PATIENT DISPOSITION:  PACU - hemodynamically stable.   Delay start of Pharmacological VTE agent (>24hrs) due to surgical blood loss or risk of bleeding: no

## 2013-01-20 NOTE — Anesthesia Preprocedure Evaluation (Addendum)
Anesthesia Evaluation  Patient identified by MRN, date of birth, ID band Patient awake    Reviewed: Allergy & Precautions, H&P , NPO status , Patient's Chart, lab work & pertinent test results  Airway Mallampati: II TM Distance: >3 FB Neck ROM: Full    Dental  (+) Teeth Intact, Dental Advisory Given and Partial Upper   Pulmonary pneumonia -, resolved,  breath sounds clear to auscultation        Cardiovascular hypertension, Pt. on medications Rhythm:Regular Rate:Normal     Neuro/Psych Anxiety Depression    GI/Hepatic negative GI ROS, Neg liver ROS,   Endo/Other  negative endocrine ROS  Renal/GU negative Renal ROS     Musculoskeletal negative musculoskeletal ROS (+)   Abdominal   Peds  Hematology negative hematology ROS (+)   Anesthesia Other Findings   Reproductive/Obstetrics negative OB ROS                          Anesthesia Physical Anesthesia Plan  ASA: III  Anesthesia Plan: General   Post-op Pain Management:    Induction: Intravenous  Airway Management Planned: Oral ETT  Additional Equipment:   Intra-op Plan:   Post-operative Plan: Extubation in OR  Informed Consent: I have reviewed the patients History and Physical, chart, labs and discussed the procedure including the risks, benefits and alternatives for the proposed anesthesia with the patient or authorized representative who has indicated his/her understanding and acceptance.   Dental advisory given  Plan Discussed with: CRNA, Anesthesiologist and Surgeon  Anesthesia Plan Comments:         Anesthesia Quick Evaluation

## 2013-01-21 NOTE — Op Note (Signed)
Op report    SURGICAL DIVISION: Plastic Surgery  PREOPERATIVE DIAGNOSES:  1. History of breast cancer.  2. Acquired absence of left breast.   POSTOPERATIVE DIAGNOSES:  1. History of breast cancer.  2. Acquired absence of left breast.   PROCEDURE:  1. Left breast reconstruction revision with repositioning of the implant with lipofilling.  SURGEON: Wayland Denis, DO  ASSISTANT: Shawn Rayburn, PA   ANESTHESIA:  General.   COMPLICATIONS: None.   INDICATIONS FOR PROCEDURE:  The patient has a history of left mastectomy and reconstruction with expander and then implant. She now presents for revision due to asymmetry.     CONSENT:  Informed consent was obtained directly from the patient. Risks, benefits and alternatives were fully discussed. Specific risks including but not limited to bleeding, infection, hematoma, seroma, scarring, pain, implant infection, implant extrusion, capsular contracture, asymmetry, wound healing problems, and need for further surgery were all discussed. The patient did have an ample opportunity to have her questions answered to her satisfaction.   DESCRIPTION OF PROCEDURE:  The patient was taken to the operating room. SCDs were placed and IV antibiotics were given. The patient's chest and abdomen were prepped and draped in a sterile fashion. A time out was performed and all information was confirmed to be correct.  Tummescent was infused into the abdomen subcutaneous tissue.   A #15 blade was used to make an incision at the left breast area.  The capsule was opened and the implant removed.  The bovie was used to open the capsule at the inframammary fold.   The capsule was repositioned more superiorly for better positioning and secured with 2-0 PDS.  The implant was then replaced. The muscle was re-approximated with 3-0 vicryl followed by 4-0 vicryl and the skin was re-approximated with 5-0 monocryl.   The adipose was removed from the abdominal area, cleaned and  injected into the left medial breast area for contouring.  Sterile dressings were applied.  A bra and abdominal binder were placed. The patient was allowed to wake up from anesthesia and taken to the recovery room in satisfactory condition.

## 2013-01-22 ENCOUNTER — Encounter (HOSPITAL_COMMUNITY): Payer: Self-pay | Admitting: Plastic Surgery

## 2013-02-05 ENCOUNTER — Encounter: Payer: Self-pay | Admitting: Family Medicine

## 2013-04-09 ENCOUNTER — Encounter: Payer: Self-pay | Admitting: Obstetrics & Gynecology

## 2013-04-09 ENCOUNTER — Ambulatory Visit (INDEPENDENT_AMBULATORY_CARE_PROVIDER_SITE_OTHER): Payer: Medicaid Other | Admitting: Obstetrics & Gynecology

## 2013-04-09 VITALS — BP 171/106 | HR 69 | Temp 97.8°F | Ht 66.5 in | Wt 335.0 lb

## 2013-04-09 DIAGNOSIS — Z0000 Encounter for general adult medical examination without abnormal findings: Secondary | ICD-10-CM | POA: Insufficient documentation

## 2013-04-09 NOTE — Patient Instructions (Signed)

## 2013-04-09 NOTE — Progress Notes (Signed)
.   Subjective:     Janet [name] is a 52 y.o. female here for a routine exam.  No current complaints.  Personal health questionnaire reviewed: no.   Gynecologic History Patient's last menstrual period was 03/15/2012. Contraception: none Last Pap: 2013. Results were: abnormal Last mammogram: 12/2012 . Results were: normal  Obstetric History OB History   Grav Para Term Preterm Abortions TAB SAB Ect Mult Living                   The following portions of the patient's history were reviewed and updated as appropriate: allergies, current medications, past family history, past medical history, past social history, past surgical history and problem list.  Review of Systems Pertinent items are noted in HPI.    Objective:      General appearance: alert  Abdomen: soft, non-tender; bowel sounds normal; no masses,  no organomegaly Pelvic:  external genitalia normal, no adnexal masses or tenderness and vagina normal without discharge      Assessment:    Healthy female exam.    Plan:   Return in 1 yr

## 2014-04-09 ENCOUNTER — Ambulatory Visit: Payer: Medicaid Other | Admitting: Obstetrics & Gynecology

## 2014-10-12 ENCOUNTER — Encounter: Payer: Self-pay | Admitting: *Deleted

## 2014-10-13 ENCOUNTER — Encounter: Payer: Self-pay | Admitting: Obstetrics & Gynecology

## 2014-10-29 ENCOUNTER — Encounter (HOSPITAL_BASED_OUTPATIENT_CLINIC_OR_DEPARTMENT_OTHER): Payer: Self-pay | Admitting: Plastic Surgery

## 2016-02-11 DIAGNOSIS — Z86000 Personal history of in-situ neoplasm of breast: Secondary | ICD-10-CM

## 2017-02-14 DIAGNOSIS — Z86000 Personal history of in-situ neoplasm of breast: Secondary | ICD-10-CM

## 2018-03-29 DIAGNOSIS — I10 Essential (primary) hypertension: Secondary | ICD-10-CM

## 2018-03-29 DIAGNOSIS — R609 Edema, unspecified: Secondary | ICD-10-CM

## 2018-03-29 DIAGNOSIS — R635 Abnormal weight gain: Secondary | ICD-10-CM

## 2018-03-29 DIAGNOSIS — Z86000 Personal history of in-situ neoplasm of breast: Secondary | ICD-10-CM

## 2019-03-11 DIAGNOSIS — I10 Essential (primary) hypertension: Secondary | ICD-10-CM

## 2019-03-11 DIAGNOSIS — I160 Hypertensive urgency: Secondary | ICD-10-CM

## 2019-03-11 DIAGNOSIS — I350 Nonrheumatic aortic (valve) stenosis: Secondary | ICD-10-CM

## 2019-03-11 DIAGNOSIS — K219 Gastro-esophageal reflux disease without esophagitis: Secondary | ICD-10-CM

## 2019-03-11 DIAGNOSIS — R069 Unspecified abnormalities of breathing: Secondary | ICD-10-CM

## 2019-03-12 DIAGNOSIS — R069 Unspecified abnormalities of breathing: Secondary | ICD-10-CM | POA: Diagnosis not present

## 2019-03-12 DIAGNOSIS — Z853 Personal history of malignant neoplasm of breast: Secondary | ICD-10-CM

## 2019-03-12 DIAGNOSIS — I161 Hypertensive emergency: Secondary | ICD-10-CM

## 2019-03-12 DIAGNOSIS — R079 Chest pain, unspecified: Secondary | ICD-10-CM

## 2019-05-12 ENCOUNTER — Encounter: Payer: Self-pay | Admitting: Family

## 2019-05-12 ENCOUNTER — Ambulatory Visit (INDEPENDENT_AMBULATORY_CARE_PROVIDER_SITE_OTHER): Payer: Commercial Managed Care - PPO | Admitting: Family

## 2019-05-12 ENCOUNTER — Other Ambulatory Visit (INDEPENDENT_AMBULATORY_CARE_PROVIDER_SITE_OTHER): Payer: Commercial Managed Care - PPO

## 2019-05-12 ENCOUNTER — Other Ambulatory Visit: Payer: Commercial Managed Care - PPO

## 2019-05-12 ENCOUNTER — Other Ambulatory Visit: Payer: Self-pay

## 2019-05-12 VITALS — BP 128/78 | HR 72 | Temp 98.6°F | Ht 66.5 in | Wt 379.2 lb

## 2019-05-12 DIAGNOSIS — I10 Essential (primary) hypertension: Secondary | ICD-10-CM

## 2019-05-12 DIAGNOSIS — M25561 Pain in right knee: Secondary | ICD-10-CM

## 2019-05-12 DIAGNOSIS — I4819 Other persistent atrial fibrillation: Secondary | ICD-10-CM | POA: Insufficient documentation

## 2019-05-12 DIAGNOSIS — R600 Localized edema: Secondary | ICD-10-CM

## 2019-05-12 DIAGNOSIS — E785 Hyperlipidemia, unspecified: Secondary | ICD-10-CM

## 2019-05-12 DIAGNOSIS — C50919 Malignant neoplasm of unspecified site of unspecified female breast: Secondary | ICD-10-CM | POA: Insufficient documentation

## 2019-05-12 DIAGNOSIS — I4891 Unspecified atrial fibrillation: Secondary | ICD-10-CM

## 2019-05-12 DIAGNOSIS — G8929 Other chronic pain: Secondary | ICD-10-CM

## 2019-05-12 DIAGNOSIS — R7301 Impaired fasting glucose: Secondary | ICD-10-CM

## 2019-05-12 DIAGNOSIS — M25562 Pain in left knee: Secondary | ICD-10-CM

## 2019-05-12 DIAGNOSIS — R5383 Other fatigue: Secondary | ICD-10-CM

## 2019-05-12 LAB — LIPID PANEL
Cholesterol: 225 mg/dL — ABNORMAL HIGH (ref 0–200)
HDL: 46.2 mg/dL (ref >39.00)
LDL Cholesterol: 145 mg/dL — ABNORMAL HIGH (ref 0–99)
NonHDL: 178.49
Total CHOL/HDL Ratio: 5
Triglycerides: 166 mg/dL — ABNORMAL HIGH (ref 0.0–149.0)
VLDL: 33.2 mg/dL (ref 0.0–40.0)

## 2019-05-12 LAB — COMPREHENSIVE METABOLIC PANEL
ALT: 13 U/L (ref 0–35)
AST: 15 U/L (ref 0–37)
Albumin: 4 g/dL (ref 3.5–5.2)
Alkaline Phosphatase: 77 U/L (ref 39–117)
BUN: 21 mg/dL (ref 6–23)
CO2: 31 mEq/L (ref 19–32)
Calcium: 9.3 mg/dL (ref 8.4–10.5)
Chloride: 100 mEq/L (ref 96–112)
Creatinine, Ser: 1.07 mg/dL (ref 0.40–1.20)
GFR: 52.76 mL/min — ABNORMAL LOW (ref >60.00)
Glucose, Bld: 115 mg/dL — ABNORMAL HIGH (ref 70–99)
Potassium: 3.8 mEq/L (ref 3.5–5.1)
Sodium: 139 mEq/L (ref 135–145)
Total Bilirubin: 0.4 mg/dL (ref 0.2–1.2)
Total Protein: 7.2 g/dL (ref 6.0–8.3)

## 2019-05-12 LAB — CBC WITH DIFFERENTIAL/PLATELET
Basophils Absolute: 0.1 10*3/uL (ref 0.0–0.1)
Basophils Relative: 0.7 % (ref 0.0–3.0)
Eosinophils Absolute: 0.3 10*3/uL (ref 0.0–0.7)
Eosinophils Relative: 3.3 % (ref 0.0–5.0)
HCT: 38.6 % (ref 36.0–46.0)
Hemoglobin: 13.2 g/dL (ref 12.0–15.0)
Lymphocytes Relative: 19.7 % (ref 12.0–46.0)
Lymphs Abs: 1.7 10*3/uL (ref 0.7–4.0)
MCHC: 34.1 g/dL (ref 30.0–36.0)
MCV: 93.4 fl (ref 78.0–100.0)
Monocytes Absolute: 0.6 10*3/uL (ref 0.1–1.0)
Monocytes Relative: 7.1 % (ref 3.0–12.0)
Neutro Abs: 5.8 10*3/uL (ref 1.4–7.7)
Neutrophils Relative %: 69.2 % (ref 43.0–77.0)
Platelets: 252 10*3/uL (ref 150.0–400.0)
RBC: 4.13 Mil/uL (ref 3.87–5.11)
RDW: 14.1 % (ref 11.5–15.5)
WBC: 8.4 10*3/uL (ref 4.0–10.5)

## 2019-05-12 LAB — TSH: TSH: 3.2 u[IU]/mL (ref 0.35–4.50)

## 2019-05-12 LAB — VITAMIN D 25 HYDROXY (VIT D DEFICIENCY, FRACTURES): VITD: 39.9 ng/mL (ref 30.00–100.00)

## 2019-05-12 MED ORDER — CITALOPRAM HYDROBROMIDE 10 MG PO TABS: 10.0000 mg | ORAL_TABLET | Freq: Every day | ORAL | 3 refills | Status: DC

## 2019-05-12 MED ORDER — LISINOPRIL 20 MG PO TABS: 20.0000 mg | ORAL_TABLET | Freq: Every day | ORAL | 1 refills | Status: DC

## 2019-05-12 NOTE — Patient Instructions (Signed)
Call your GI and cardiologist;

## 2019-05-12 NOTE — Progress Notes (Signed)
Janet [name] is a 58 y.o. female with the following history as recorded in [employer]:  Patient Active Problem List   Diagnosis Date Noted  . Essential hypertension 05/12/2019  . Hyperlipidemia 05/12/2019  . Pedal edema 05/12/2019  . Atrial fibrillation, transient ([place]) 05/12/2019  . Morbid obesity ([place]) 05/12/2019  . Malignant neoplasm of female breast ([place]) 05/12/2019  . Routine general medical examination at a health care facility 04/09/2013    Current Outpatient Medications  Medication Sig Dispense Refill  . Ascorbic Acid (VITAMIN C) 100 MG tablet Take 100 mg by mouth daily.    Marland Kitchen aspirin 325 MG tablet Take 325 mg by mouth daily.    . calcium carbonate (TUMS - DOSED IN MG ELEMENTAL CALCIUM) 500 MG chewable tablet Chew 1 tablet by mouth daily.    . cholecalciferol (VITAMIN D3) 25 MCG (1000 UT) tablet Take 1,000 Units by mouth daily.    . citalopram (CELEXA) 10 MG tablet Take 1 tablet (10 mg total) by mouth daily. 90 tablet 3  . furosemide (LASIX) 40 MG tablet Take 40 mg by mouth. Take 2 tablets in the am; take 1 tablet in the pm    . lisinopril (ZESTRIL) 20 MG tablet Take 1 tablet (20 mg total) by mouth daily. 90 tablet 1  . Multiple Vitamin (MULTIVITAMIN WITH MINERALS) TABS Take 1 tablet by mouth daily.    . vitamin B-12 (CYANOCOBALAMIN) 100 MCG tablet Take 100 mcg by mouth daily.     No current facility-administered medications for this visit.     Allergies: Amoxicillin, Clarithromycin, Ivp dye [iodinated diagnostic agents], and Tape  Past Medical History:  Diagnosis Date  . Anemia    history of anemia from AUB  . Anxiety   . Breast cancer ([place]) 2012   left breast/  NO CHEMO  . Complication of anesthesia    difficult IV access- never needed PICC line  . Depression    no meds  . Edema of both legs   . Hypertension    ekg 1/13 on chart  . Pneumonia 1989   hx  . PONV (postoperative nausea and vomiting)   . Umbilical hernia    currently - not repaired yet    Past  Surgical History:  Procedure Laterality Date  . ABDOMINAL HYSTERECTOMY  04/30/2012   Procedure: HYSTERECTOMY ABDOMINAL;  Surgeon: Imagene Gurney A. Alycia Rossetti, MD;  Location: [facility];  Service: Gynecology;  Laterality: N/A;  . BREAST CAPSULECTOMY WITH IMPLANT EXCHANGE  10/02/2012   Procedure: BREAST CAPSULECTOMY WITH IMPLANT EXCHANGE;  Surgeon: Theodoro Kos, DO;  Location: [place];  Service: Plastics;  Laterality: Left;   LEFT EXPANDER REMOVAL WITH CAPSULECTOMY AND PLACEMENT OF IMPLANT  . BREAST IMPLANT EXCHANGE Left 01/20/2013   Procedure: REVISION OF LEFT BREAST  IMPLANT ;  Surgeon: Theodoro Kos, DO;  Location: [place];  Service: Plastics;  Laterality: Left;  . BREAST REDUCTION SURGERY N/A 01/20/2013   Procedure: Abdominal fat graft to breast reconstruction area;  Surgeon: Theodoro Kos, DO;  Location: [place];  Service: Plastics;  Laterality: N/A;  . BREAST SURGERY  2012   lt mastectomy/ axillary dissection  . BREAST SURGERY  2012   tissue expander lt  . COLONOSCOPY    . DILATION AND CURETTAGE OF UTERUS  01/26/2012   Procedure: DILATATION AND CURETTAGE;  Surgeon: Lahoma Crocker, MD;  Location: [facility];  Service: Gynecology;;  . ESOPHAGOGASTRODUODENOSCOPY    . HERNIA REPAIR    . LAPAROTOMY  04/30/2012   Procedure: EXPLORATORY  LAPAROTOMY;  Surgeon: Lucita Lora. Alycia Rossetti, MD;  Location: [facility];  Service: Gynecology;  Laterality: N/A;  . MASTECTOMY     Left side  . MASTOPEXY  10/02/2012   Procedure: MASTOPEXY;  Surgeon: Theodoro Kos, DO;  Location: [place];  Service: Plastics;  Laterality: Right;  RIGHT BREAST MASTOPEXY REDUCTION FOR SYMMETRY,  . MULTIPLE EXTRACTIONS WITH ALVEOLOPLASTY  10/23/2011  . MULTIPLE TOOTH EXTRACTIONS    . NOVASURE ABLATION  03/01/2012  . RECONSTRUCTION BREAST IMMEDIATE / DELAYED W/ TISSUE EXPANDER     tissue expander lt-9/12  . SALPINGOOPHORECTOMY  04/30/2012   Procedure: SALPINGO OOPHERECTOMY;  Surgeon: Imagene Gurney A. Alycia Rossetti, MD;  Location: [facility];  Service:  Gynecology;  Laterality: Bilateral;  . TOE OSTEOTOMY     rt toe  . VENTRAL HERNIA REPAIR  04/30/2012   Procedure: HERNIA REPAIR VENTRAL ADULT;  Surgeon: Imagene Gurney A. Alycia Rossetti, MD;  Location: [facility];  Service: Gynecology;  Laterality: N/A;    History reviewed. No pertinent family history.  Social History   Tobacco Use  . Smoking status: Never Smoker  . Smokeless tobacco: Never Used  Substance Use Topics  . Alcohol use: No    Subjective:  Patient presents today as a new patient; history of breast cancer, uterine and ovarian cancer- not currently under the care of GYN but does still see her oncologist regularly; mammogram is scheduled next month; does not have any records with her today for review;  History of paroxysmal A. Fib- not under the care of cardiology/ thinks she has prescription for Diltiazem- states it "is a gray pill" but she does not use it.   Complaining of 1 year history of bilateral knee pain-has not seen a specialist due to no insurance; requesting referral to sports medicine;  Chronic problems with fluid/ swelling- takes 120 mg of Lasix daily with persistent problems; does have cardiologist in [place]; was in the ER in with extremely high blood pressure but has not seen in follow-up;   Will be keeping cardiology, GI and oncology with care in [facility]; thinks her colonoscopy is not due until 2023;    Objective:  Vitals:   05/12/19 0857  BP: 128/78  Pulse: 72  Temp: 98.6 F (37 C)  TempSrc: Oral  SpO2: 97%  Weight: (!) 379 lb 3.2 oz (172 kg)  Height: 5' 6.5" (1.689 m)    General: Well developed, well nourished, in no acute distress  Skin : Warm and dry.  Head: Normocephalic and atraumatic  Eyes: Sclera and conjunctiva clear; pupils round and reactive to light; extraocular movements intact  Ears: External normal; canals clear; tympanic membranes normal  Oropharynx: Pink, supple. No suspicious lesions  Neck: Supple without thyromegaly, adenopathy  Lungs:  Respirations unlabored; clear to auscultation bilaterally without wheeze, rales, rhonchi  CVS exam: normal rate and regular rhythm.  Abdomen: Soft; nontender; nondistended; difficult to assess due to body habitus   Extremities bilateral pedal edema, no cyanosis, no clubbing  Vessels: Symmetric bilaterally  Neurologic: Alert and oriented; speech intact; face symmetrical; moves all extremities well; CNII-XII intact without focal deficit   Assessment:  1. Essential hypertension   2. Hyperlipidemia, unspecified hyperlipidemia type   3. Other fatigue   4. Pedal edema   5. Atrial fibrillation, transient ([place])   6. Morbid obesity ([place])   7. Malignant neoplasm of female breast, unspecified estrogen receptor status, unspecified laterality, unspecified site of breast ([place])   8. Chronic pain of both knees     Plan:  1. Stable; continue Lisinopril 20 mg daily; refill updated; check CBC, CMP; 2. Has been prescribed statin in the past- not currently taking; update lipid panel today- may need to consider alternative medication- sounds like she have tried Crestor in the past; again, no records available for review. 3. Check TSH, Vitamin D; will need to consider sleep study as well; 4. Due to amount of Lasix she is taking and extreme swelling that is still noted, recommend that she see her cardiologist in follow-up; she agrees and will schedule her own appointment. ? Is she needs Torsemide instead? 5. Recommend that she follow-up with her cardiologist- she agrees; 7. Scheduled to see her oncologist next month who is managing her mammograms; have also asked patient to contact her GI about follow-up date on colonoscopy due to history of cancer.  8. See sports medicine for evaluation;   Return for Dr. Tamala Julian for bilateral knee pain.  Orders Placed This Encounter  Procedures  . CBC w/Diff    Standing Status:   Future    Number of Occurrences:   1    Standing Expiration Date:   05/11/2020  . Comp Met (CMET)     Standing Status:   Future    Number of Occurrences:   1    Standing Expiration Date:   05/11/2020  . Lipid panel    Standing Status:   Future    Number of Occurrences:   1    Standing Expiration Date:   05/11/2020  . TSH    Standing Status:   Future    Number of Occurrences:   1    Standing Expiration Date:   05/11/2020  . Vitamin D (25 hydroxy)    Standing Status:   Future    Number of Occurrences:   1    Standing Expiration Date:   05/11/2020    Requested Prescriptions   Signed Prescriptions Disp Refills  . lisinopril (ZESTRIL) 20 MG tablet 90 tablet 1    Sig: Take 1 tablet (20 mg total) by mouth daily.  . citalopram (CELEXA) 10 MG tablet 90 tablet 3    Sig: Take 1 tablet (10 mg total) by mouth daily.

## 2019-05-13 LAB — HEMOGLOBIN A1C: Hgb A1c MFr Bld: <4 % of total Hgb (ref <5.7)

## 2019-06-05 ENCOUNTER — Ambulatory Visit (INDEPENDENT_AMBULATORY_CARE_PROVIDER_SITE_OTHER)
Admission: RE | Admit: 2019-06-05 | Discharge: 2019-06-05 | Disposition: A | Discharge status: Home / Self Care | Payer: Commercial Managed Care - PPO | Source: Ambulatory Visit | Attending: Family Medicine | Admitting: Family Medicine

## 2019-06-05 ENCOUNTER — Encounter: Payer: Self-pay | Admitting: Family Medicine

## 2019-06-05 ENCOUNTER — Ambulatory Visit (INDEPENDENT_AMBULATORY_CARE_PROVIDER_SITE_OTHER): Payer: Commercial Managed Care - PPO | Admitting: Family Medicine

## 2019-06-05 ENCOUNTER — Other Ambulatory Visit: Payer: Self-pay

## 2019-06-05 VITALS — BP 122/80 | HR 58 | Ht 66.5 in | Wt 372.0 lb

## 2019-06-05 DIAGNOSIS — M25562 Pain in left knee: Secondary | ICD-10-CM

## 2019-06-05 DIAGNOSIS — M25561 Pain in right knee: Secondary | ICD-10-CM | POA: Diagnosis not present

## 2019-06-05 DIAGNOSIS — M170 Bilateral primary osteoarthritis of knee: Secondary | ICD-10-CM | POA: Diagnosis not present

## 2019-06-05 DIAGNOSIS — G8929 Other chronic pain: Secondary | ICD-10-CM

## 2019-06-05 NOTE — Assessment & Plan Note (Signed)
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 Discussed with patient about different treatment options and is going to try topical anti-inflammatories, icing regimen, potential weakness, discussed nonweightbearing exercises that may be more beneficial.  Follow-up with me again in 4 to 8 weeks and will consider injections.

## 2019-06-05 NOTE — Patient Instructions (Addendum)
Good to see you.  xray downstairs Ice 20 minutes 2 times daily. Usually after activity and before bed. Exercises 3 times a week.  pennsaid pinkie amount topically 2 times daily as needed.  Turmeric 500mg  daily  Tart cherry extract 1200mg  at night Vitamin D 2000 IU daily  See me again in 5-6 weeks and we will consider injections

## 2019-06-05 NOTE — Progress Notes (Signed)
[name] [name], [place] [address] Phone: [phone] Subjective:   I, Janet [name], am serving as a scribe for Dr. Hulan Saas.   CC: Knee pain   [id number]  Janet [name] is a 58 y.o. female coming in with complaint of bilateral knee pain. States she felt like she twisted the right knee. Compensation on the left side. Swelling bilaterally but right is worse than left. States that her knee feels like there is a tight bandage around it. Rain makes the knee feel tighter. Knee was also warm to the touch. Feels better in flexion.   Onset- 1 year Location - knee cap Character-dull, throbbing aching with sharp pain with certain movements         Aggravating factors- extension  Therapies tried- Ibuprofen  Severity-9 out of 10     Past Medical History:  Diagnosis Date  . Anemia    history of anemia from AUB  . Anxiety   . Breast cancer ([place]) 2012   left breast/  NO CHEMO  . Complication of anesthesia    difficult IV access- never needed PICC line  . Depression    no meds  . Edema of both legs   . Hypertension    ekg 1/13 on chart  . Pneumonia 1989   hx  . PONV (postoperative nausea and vomiting)   . Umbilical hernia    currently - not repaired yet   Past Surgical History:  Procedure Laterality Date  . ABDOMINAL HYSTERECTOMY  04/30/2012   Procedure: HYSTERECTOMY ABDOMINAL;  Surgeon: Imagene Gurney A. Alycia Rossetti, MD;  [facility]: [facility];  Service: Gynecology;  Laterality: N/A;  . BREAST CAPSULECTOMY WITH IMPLANT EXCHANGE  10/02/2012   Procedure: BREAST CAPSULECTOMY WITH IMPLANT EXCHANGE;  Surgeon: Theodoro Kos, DO;  Location: [facility];  Service: Plastics;  Laterality: Left;   LEFT EXPANDER REMOVAL WITH CAPSULECTOMY AND PLACEMENT OF IMPLANT  . BREAST IMPLANT EXCHANGE Left 01/20/2013   Procedure: REVISION OF LEFT BREAST  IMPLANT ;  Surgeon: Theodoro Kos, DO;  Location: [facility];  Service: Plastics;  Laterality: Left;  .  BREAST REDUCTION SURGERY N/A 01/20/2013   Procedure: Abdominal fat graft to breast reconstruction area;  Surgeon: Theodoro Kos, DO;  Location: [facility];  Service: Plastics;  Laterality: N/A;  . BREAST SURGERY  2012   lt mastectomy/ axillary dissection  . BREAST SURGERY  2012   tissue expander lt  . COLONOSCOPY    . DILATION AND CURETTAGE OF UTERUS  01/26/2012   Procedure: DILATATION AND CURETTAGE;  Surgeon: Lahoma Crocker, MD;  Location: [facility];  Service: Gynecology;;  . ESOPHAGOGASTRODUODENOSCOPY    . HERNIA REPAIR    . LAPAROTOMY  04/30/2012   Procedure: EXPLORATORY LAPAROTOMY;  Surgeon: Imagene Gurney A. Alycia Rossetti, MD;  [facility];  Service: Gynecology;  Laterality: N/A;  . MASTECTOMY     Left side  . MASTOPEXY  10/02/2012   Procedure: MASTOPEXY;  Surgeon: Theodoro Kos, DO;  Location: [facility];  Service: Plastics;  Laterality: Right;  RIGHT BREAST MASTOPEXY REDUCTION FOR SYMMETRY,  . MULTIPLE EXTRACTIONS WITH ALVEOLOPLASTY  10/23/2011  . MULTIPLE TOOTH EXTRACTIONS    . NOVASURE ABLATION  03/01/2012  . RECONSTRUCTION BREAST IMMEDIATE / DELAYED W/ TISSUE EXPANDER     tissue expander lt-9/12  . SALPINGOOPHORECTOMY  04/30/2012   Procedure: SALPINGO OOPHERECTOMY;  Surgeon: Imagene Gurney A. Alycia Rossetti, MD;  [facility];  Service: Gynecology;  Laterality: Bilateral;  . TOE OSTEOTOMY  rt toe  . VENTRAL HERNIA REPAIR  04/30/2012   Procedure: HERNIA REPAIR VENTRAL ADULT;  Surgeon: Imagene Gurney A. Alycia Rossetti, MD;  [facility];  Service: Gynecology;  Laterality: N/A;   Social History   Socioeconomic History  . Marital status: Single    Spouse name: Not on file  . Number of children: Not on file  . Years of education: Not on file  . Highest education level: Not on file  Occupational History  . Not on file  Social Needs  . Financial resource strain: Not on file  . Food insecurity    Worry: Not on file    Inability: Not on file  . Transportation needs    Medical: Not on file     Non-medical: Not on file  Tobacco Use  . Smoking status: Never Smoker  . Smokeless tobacco: Never Used  Substance and Sexual Activity  . Alcohol use: No  . Drug use: No  . Sexual activity: Never    Birth control/protection: None  Lifestyle  . Physical activity    Days per week: Not on file    Minutes per session: Not on file  . Stress: Not on file  Relationships  . Social Herbalist on phone: Not on file    Gets together: Not on file    Attends religious service: Not on file    Active member of club or organization: Not on file    Attends meetings of clubs or organizations: Not on file    Relationship status: Not on file  Other Topics Concern  . Not on file  Social History Narrative  . Not on file   Allergies  Allergen Reactions  . Amoxicillin Other (See Comments)    Decreased heart rate  . Clarithromycin Other (See Comments)    Breaks mouth out  . Ivp Dye [Iodinated Diagnostic Agents] Other (See Comments)    States made bp systolic has increased and diastolic has decreased and hasnt felt well x 3 weeks since dye adm  . Tape Dermatitis    Only can have paper tape   No family history on file.   Current Outpatient Medications (Cardiovascular):  .  furosemide (LASIX) 40 MG tablet, Take 40 mg by mouth. Take 2 tablets in the am; take 1 tablet in the pm .  lisinopril (ZESTRIL) 20 MG tablet, Take 1 tablet (20 mg total) by mouth daily.   Current Outpatient Medications (Analgesics):  .  aspirin 325 MG tablet, Take 325 mg by mouth daily.  Current Outpatient Medications (Hematological):  .  vitamin B-12 (CYANOCOBALAMIN) 100 MCG tablet, Take 100 mcg by mouth daily.  Current Outpatient Medications (Other):  Marland Kitchen  Ascorbic Acid (VITAMIN C) 100 MG tablet, Take 100 mg by mouth daily. .  calcium carbonate (TUMS - DOSED IN MG ELEMENTAL CALCIUM) 500 MG chewable tablet, Chew 1 tablet by mouth daily. .  cholecalciferol (VITAMIN D3) 25 MCG (1000 UT) tablet, Take 1,000 Units by  mouth daily. .  citalopram (CELEXA) 10 MG tablet, Take 1 tablet (10 mg total) by mouth daily. .  Multiple Vitamin (MULTIVITAMIN WITH MINERALS) TABS, Take 1 tablet by mouth daily.    Past medical history, social, surgical and family history all reviewed in electronic medical record.  No pertanent information unless stated regarding to the chief complaint.   Review of Systems:  No headache, visual changes, nausea, vomiting, diarrhea, constipation, dizziness, abdominal pain, skin rash, fevers, chills, night sweats, weight loss, swollen lymph nodes, body  aches, joint swelling,  chest pain, shortness of breath, mood changes.  Positive muscle aches  Objective  Blood pressure 122/80, pulse (!) 58, height 5' 6.5" (1.689 m), weight (!) 372 lb (168.7 kg), last menstrual period 03/15/2012, SpO2 93 %.    General: No apparent distress alert and oriented x3 mood and affect normal, dressed appropriately.  Morbidly obese HEENT: Pupils equal, extraocular movements intact  Respiratory: Patient's speak in full sentences and does not appear short of breath  Cardiovascular: 2+ lower extremity edema, non tender, no erythema  Skin: Warm dry intact with no signs of infection or rash on extremities or on axial skeleton.  Abdomen: Soft nontender  Neuro: Cranial nerves II through XII are intact, neurovascularly intact in all extremities with 2+ DTRs and 2+ pulses.  Lymph: No lymphadenopathy of posterior or anterior cervical chain  MSK:  tender with limited range of motion of shoulders, elbows, wrist, hip, and ankles bilaterally.    Knee: Bilateral Inspection very difficult secondary to patient's body habitus valgus deformity noted. Large thigh to calf ratio.  Tender to palpation diffusely ROM lacks last 10 degrees of extension in the last 20 degrees of flexion bilaterally instability with valgus force.  painful patellar compression. Patellar glide with moderate crepitus. Patellar and quadriceps tendons  unremarkable. Hamstring and quadriceps strength is normal.     Impression and Recommendations:     This case required medical decision making of moderate complexity. The above documentation has been reviewed and is accurate and complete Lyndal Pulley, DO       Note: This dictation was prepared with Dragon dictation along with smaller phrase technology. Any transcriptional errors that result from this process are unintentional.

## 2019-06-16 DIAGNOSIS — D0512 Intraductal carcinoma in situ of left breast: Secondary | ICD-10-CM | POA: Diagnosis not present

## 2019-06-16 DIAGNOSIS — C50819 Malignant neoplasm of overlapping sites of unspecified female breast: Secondary | ICD-10-CM | POA: Diagnosis not present

## 2019-06-16 DIAGNOSIS — D500 Iron deficiency anemia secondary to blood loss (chronic): Secondary | ICD-10-CM | POA: Diagnosis not present

## 2019-06-16 DIAGNOSIS — D649 Anemia, unspecified: Secondary | ICD-10-CM

## 2019-07-09 ENCOUNTER — Encounter: Payer: Self-pay | Admitting: Family Medicine

## 2019-07-09 ENCOUNTER — Other Ambulatory Visit: Payer: Self-pay

## 2019-07-09 ENCOUNTER — Ambulatory Visit (INDEPENDENT_AMBULATORY_CARE_PROVIDER_SITE_OTHER): Payer: Commercial Managed Care - PPO | Admitting: Family Medicine

## 2019-07-09 DIAGNOSIS — M170 Bilateral primary osteoarthritis of knee: Secondary | ICD-10-CM

## 2019-07-09 NOTE — Assessment & Plan Note (Signed)
Severe arthritic changes bilaterally.  Patient is to hold on any type of injection.  Encourage patient to consider the weight loss.  As long as patient does well though and does not want any other invasive procedures patient will follow-up as needed

## 2019-07-09 NOTE — Patient Instructions (Signed)
[phone] 

## 2019-07-09 NOTE — Progress Notes (Signed)
[name] [address], [place] [address] Phone: [phone] Subjective:   Janet Janet [name], am serving as a scribe for Dr. Hulan [name].   CC: Bilateral knee pain  [id number]   06/05/2019 Patient has severe knee arthritis secondary to likely genetics as well as patient's body habitus. Discussed with patient about different treatment options and is going to try topical anti-inflammatories, icing regimen, potential weakness, discussed nonweightbearing exercises that may be more beneficial.  Follow-up with me again in 4 to 8 weeks and will consider injections.  Update 07/09/2019 Janet Janet [name] is a 58 y.o. female coming in with complaint of bilateral knee pain. Has been using turmeric and tart cherry. Left knee improved with these supplements. Has had to take IBU twice once after going to get groceries and another time after cutting the grass. Pain has increased this week with the weather changes. Did walk more than usual this weekend.      Past Medical History:  Diagnosis Date  . Anemia    history of anemia from AUB  . Anxiety   . Breast cancer ([facility]) 2012   left breast/  Janet [name] CHEMO  . Complication of anesthesia    difficult IV access- never needed PICC line  . Depression    Janet [name] meds  . Edema of both legs   . Hypertension    ekg 1/13 on chart  . Pneumonia 1989   hx  . PONV (postoperative nausea and vomiting)   . Umbilical hernia    currently - not repaired yet   Past Surgical History:  Procedure Laterality Date  . ABDOMINAL HYSTERECTOMY  04/30/2012   Procedure: HYSTERECTOMY ABDOMINAL;  Surgeon: Janet Gurney A. Alycia Rossetti, MD;  [facility];  Service: Gynecology;  Laterality: N/A;  . BREAST CAPSULECTOMY WITH IMPLANT EXCHANGE  10/02/2012   Procedure: BREAST CAPSULECTOMY WITH IMPLANT EXCHANGE;  Surgeon: Janet Kos, DO;  Location: [facility];  Service: Plastics;  Laterality: Left;   LEFT EXPANDER REMOVAL WITH CAPSULECTOMY AND  PLACEMENT OF IMPLANT  . BREAST IMPLANT EXCHANGE Left 01/20/2013   Procedure: REVISION OF LEFT BREAST  IMPLANT ;  Surgeon: Janet Kos, DO;  Location: [facility];  Service: Plastics;  Laterality: Left;  . BREAST REDUCTION SURGERY N/A 01/20/2013   Procedure: Abdominal fat graft to breast reconstruction area;  Surgeon: Janet Kos, DO;  Location: [facility];  Service: Plastics;  Laterality: N/A;  . BREAST SURGERY  2012   lt mastectomy/ axillary dissection  . BREAST SURGERY  2012   tissue expander lt  . COLONOSCOPY    . DILATION AND CURETTAGE OF UTERUS  01/26/2012   Procedure: DILATATION AND CURETTAGE;  Surgeon: Janet Crocker, MD;  Location: [facility];  Service: Gynecology;;  . ESOPHAGOGASTRODUODENOSCOPY    . HERNIA REPAIR    . LAPAROTOMY  04/30/2012   Procedure: EXPLORATORY LAPAROTOMY;  Surgeon: Janet Gurney A. Alycia Rossetti, MD;  [facility];  Service: Gynecology;  Laterality: N/A;  . MASTECTOMY     Left side  . MASTOPEXY  10/02/2012   Procedure: MASTOPEXY;  Surgeon: Janet Kos, DO;  Location: [facility];  Service: Plastics;  Laterality: Right;  RIGHT BREAST MASTOPEXY REDUCTION FOR SYMMETRY,  . MULTIPLE EXTRACTIONS WITH ALVEOLOPLASTY  10/23/2011  . MULTIPLE TOOTH EXTRACTIONS    . NOVASURE ABLATION  03/01/2012  . RECONSTRUCTION BREAST IMMEDIATE / DELAYED W/ TISSUE EXPANDER     tissue expander lt-9/12  . SALPINGOOPHORECTOMY  04/30/2012   Procedure: SALPINGO OOPHERECTOMY;  Surgeon:  Janet A. Alycia Rossetti, MD;  [facility];  Service: Gynecology;  Laterality: Bilateral;  . TOE OSTEOTOMY     rt toe  . VENTRAL HERNIA REPAIR  04/30/2012   Procedure: HERNIA REPAIR VENTRAL ADULT;  Surgeon: Janet Gurney A. Alycia Rossetti, MD;  [facility];  Service: Gynecology;  Laterality: N/A;   Social History   Socioeconomic History  . Marital status: Single    Spouse name: Not on file  . Number of children: Not on file  . Years of education: Not on file  . Highest education level: Not on file  Occupational History  .  Not on file  Social Needs  . Financial resource strain: Not on file  . Food insecurity    Worry: Not on file    Inability: Not on file  . Transportation needs    Medical: Not on file    Non-medical: Not on file  Tobacco Use  . Smoking status: Never Smoker  . Smokeless tobacco: Never Used  Substance and Sexual Activity  . Alcohol use: Janet [name]  . Drug use: Janet [name]  . Sexual activity: Never    Birth control/protection: None  Lifestyle  . Physical activity    Days per week: Not on file    Minutes per session: Not on file  . Stress: Not on file  Relationships  . Social Herbalist on phone: Not on file    Gets together: Not on file    Attends religious service: Not on file    Active member of club or organization: Not on file    Attends meetings of clubs or organizations: Not on file    Relationship status: Not on file  Other Topics Concern  . Not on file  Social History Narrative  . Not on file   Allergies  Allergen Reactions  . Amoxicillin Other (See Comments)    Decreased heart rate  . Clarithromycin Other (See Comments)    Breaks mouth out  . Ivp Dye [Iodinated Diagnostic Agents] Other (See Comments)    States made bp systolic has increased and diastolic has decreased and hasnt felt well x 3 weeks since dye adm  . Tape Dermatitis    Only can have paper tape   Janet [name] family history on file.   Current Outpatient Medications (Cardiovascular):  .  furosemide (LASIX) 40 MG tablet, Take 40 mg by mouth. Take 2 tablets in the am; take 1 tablet in the pm .  lisinopril (ZESTRIL) 20 MG tablet, Take 1 tablet (20 mg total) by mouth daily.   Current Outpatient Medications (Analgesics):  .  aspirin 325 MG tablet, Take 325 mg by mouth daily.  Current Outpatient Medications (Hematological):  .  vitamin B-12 (CYANOCOBALAMIN) 100 MCG tablet, Take 100 mcg by mouth daily.  Current Outpatient Medications (Other):  Marland Kitchen  Ascorbic Acid (VITAMIN C) 100 MG tablet, Take 100 mg by mouth  daily. .  calcium carbonate (TUMS - DOSED IN MG ELEMENTAL CALCIUM) 500 MG chewable tablet, Chew 1 tablet by mouth daily. .  cholecalciferol (VITAMIN D3) 25 MCG (1000 UT) tablet, Take 1,000 Units by mouth daily. .  citalopram (CELEXA) 10 MG tablet, Take 1 tablet (10 mg total) by mouth daily. .  Multiple Vitamin (MULTIVITAMIN WITH MINERALS) TABS, Take 1 tablet by mouth daily.    Past medical history, social, surgical and family history all reviewed in electronic medical record.  Janet [name] pertanent information unless stated regarding to the chief complaint.   Review of Systems:  Janet [name]  headache, visual changes, nausea, vomiting, diarrhea, constipation, dizziness, abdominal pain, skin rash, fevers, chills, night sweats, weight loss, swollen lymph nodes, body aches, joint swelling, chest pain, shortness of breath, mood changes.  Positive muscle aches  Objective  Blood pressure 118/74, pulse (!) 56, height 5' 6.5" (1.689 m), weight (!) 373 lb (169.2 kg), last menstrual period 03/15/2012, SpO2 98 %.    General: Janet [name] apparent distress alert and oriented x3 mood and affect normal, dressed appropriately.  HEENT: Pupils equal, extraocular movements intact  Respiratory: Patient's speak in full sentences and does not appear short of breath  Cardiovascular: 1+ lower extremity edema, non tender, Janet [name] erythema  Skin: Warm dry intact with Janet [name] signs of infection or rash on extremities or on axial skeleton.  Abdomen: Soft nontender  Neuro: Cranial nerves II through XII are intact, neurovascularly intact in all extremities with 2+ DTRs and 2+ pulses.  Lymph: Janet [name] lymphadenopathy of posterior or anterior cervical chain or axillae bilaterally.  Gait antalgic MSK:  Knee: Bilateral valgus deformity noted. Large thigh to calf ratio.  Tender to palpation over medial and PF joint line.  ROM full in flexion and extension and lower leg rotation. instability with valgus force.  painful patellar compression. Patellar glide with  moderate crepitus. Patellar and quadriceps tendons unremarkable. Hamstring and quadriceps strength is normal.    Impression and Recommendations:      The above documentation has been reviewed and is accurate and complete Lyndal Pulley, DO       Note: This dictation was prepared with Dragon dictation along with smaller phrase technology. Any transcriptional errors that result from this process are unintentional.

## 2019-08-04 ENCOUNTER — Other Ambulatory Visit: Payer: Self-pay | Admitting: Family

## 2019-08-04 NOTE — Telephone Encounter (Signed)
Patient would like refills on the following medications and have them sent to her preferred pharmacy [facility] in [place]:  furosemide (LASIX) 40 MG tablet cholecalciferol (VITAMIN D3) 25 MCG (1000 UT) tablet citalopram (CELEXA) 10 MG tablet

## 2019-08-04 NOTE — Telephone Encounter (Deleted)
Refill request for celexa 10 mg (too soon) nee

## 2019-08-11 ENCOUNTER — Telehealth: Payer: Self-pay | Admitting: Family

## 2019-08-11 ENCOUNTER — Other Ambulatory Visit: Payer: Self-pay | Admitting: Family

## 2019-08-11 MED ORDER — FUROSEMIDE 40 MG PO TABS: ORAL_TABLET | ORAL | 0 refills | Status: DC

## 2019-08-11 MED ORDER — VITAMIN D 25 MCG (1000 UNIT) PO TABS: 1000.0000 [IU] | ORAL_TABLET | Freq: Every day | ORAL | 0 refills | Status: DC

## 2019-08-11 NOTE — Telephone Encounter (Signed)
error 

## 2019-08-11 NOTE — Telephone Encounter (Signed)
Okay to fill medications.  They are both pending-not sure where she wanted them sent.  Route this to Mickel Baas so she can decide if she wants to refer her to a cardiologist or have her follow-up with her.  Prescription sent for 1 month.

## 2019-08-11 NOTE — Telephone Encounter (Signed)
RX REFILL furosemide (LASIX) 40 MG tablet cholecalciferol (VITAMIN D3) 25 MCG (1000 UT) tablet   [place], [place] - [address] [phone] (Phone) [phone] (Fax)   Patient is very upset that this medication was was not filled.  Patient has no medication.

## 2019-08-12 ENCOUNTER — Telehealth: Payer: Self-pay

## 2019-08-12 NOTE — Telephone Encounter (Signed)
Message addressed on yesterday with patient and she has been contacted this morning.

## 2019-08-12 NOTE — Telephone Encounter (Signed)
I have spoken with patient this morning and informed her that refills have been sent in. I informed her that you would be back on Monday and I would check with you to see what the next step was regarding her seeing a cardiologist as recommended in her last note. She states again she has seen multiple specialists and they have found nothing wrong with her and she has seen cardiologist through [facility]. I asked her to clarify because yesterday she told me she didn't have one. Patient states she is not going to see a cardiologist for no reason. I reiterated with her again that this would be addressed when you return as we need to ensure she is receiving the best care to take care of her health. She again started back up that when she was hear she didn't feel like you were listening to her because she told you repeatedly that she didn't have a cardiologist. I politely told patient again this would be addressed when you returned. Patient stated during conversation that maybe she needed to see a provider that listened to her. I ended call with telling her to have a nice day and to contact pharmacy regarding her refills.

## 2019-08-15 NOTE — Telephone Encounter (Signed)
1) In the note, it very clearly says that she told me she would take care of scheduling her own cardiology appointment. I offered because she told me she didn't have one and she said, she would do it herself. We talked about the extreme swelling even on the Lasix and that other options might be needed.  2) Please ask Janet [name] to call her and encourage her to find another PCP. Patient has expressed that she does not have confidence in me and I agree that this is not a good relationship.

## 2019-09-19 ENCOUNTER — Other Ambulatory Visit: Payer: Self-pay | Admitting: Family

## 2019-09-19 ENCOUNTER — Telehealth: Payer: Self-pay

## 2019-09-19 MED ORDER — VITAMIN D 25 MCG (1000 UNIT) PO TABS: 1000.0000 [IU] | ORAL_TABLET | Freq: Every day | ORAL | 0 refills | Status: DC

## 2019-09-19 MED ORDER — FUROSEMIDE 40 MG PO TABS: ORAL_TABLET | ORAL | 0 refills | Status: DC

## 2019-09-19 NOTE — Telephone Encounter (Signed)
Copied from [place] [id number]. Topic: Quick Communication - Rx Refill/Question >> Sep 19, 2019 10:30 AM Leward Quan A wrote: Medication: furosemide (LASIX) 40 MG tablet, cholecalciferol (VITAMIN D3) 25 MCG (1000 UT) tablet   Has the patient contacted their pharmacy? Yes.   (Agent: If no, request that the patient contact the pharmacy for the refill.) (Agent: If yes, when and what did the pharmacy advise?)  Preferred Pharmacy (with phone number or street name): [place], [place] - [address] [phone] (Phone) [phone] (Fax)    Agent: Please be advised that RX refills may take up to 3 business days. We ask that you follow-up with your pharmacy.

## 2019-09-19 NOTE — Telephone Encounter (Signed)
Copied from [place] [id number]. Topic: General - Other >> Sep 19, 2019 10:33 AM Leward Quan A wrote: Reason for CRM: Patient called to say that she is a bit frustrated because she need refill on her medication and asking Jodi Mourning  and also want to inform Jodi Mourning that she was told that she does not need a Cardiologist. Patient is also requesting a call back at Ph# [phone]

## 2019-09-19 NOTE — Telephone Encounter (Signed)
I spoke to patient about her concerns; both patient and provider are in agreement that this is not an effective working relationship and patient should establish with a new PCP. I explained that she was not being dismissed from [facility]. I will have my office manager reach out to her about new PCP options.

## 2019-09-19 NOTE — Telephone Encounter (Signed)
Requested medication (s) are due for refill today: yes  Requested medication (s) are on the active medication list: yes  Last refill:  08/11/2019  Future visit scheduled: no  Notes to clinic: refill cannot be delegated    Requested Prescriptions  Pending Prescriptions Disp Refills   cholecalciferol (VITAMIN D3) 25 MCG (1000 UT) tablet 90 tablet 0    Sig: Take 1 tablet (1,000 Units total) by mouth daily.     Endocrinology:  Vitamins - Vitamin D Supplementation Failed - 09/19/2019 10:51 AM      Failed - 50,000 IU strengths are not delegated      Failed - Phosphate in normal range and within 360 days    No results found for: PHOS       Failed - Vitamin D in normal range and within 360 days    VITD  Date Value Ref Range Status  05/12/2019 39.90 30.00 - 100.00 ng/mL Final         Passed - Ca in normal range and within 360 days    Calcium  Date Value Ref Range Status  05/12/2019 9.3 8.4 - 10.5 mg/dL Final         Passed - Valid encounter within last 12 months    Recent Outpatient Visits          2 months ago Primary osteoarthritis of both knees   [facility] [place], [place], DO   3 months ago Chronic pain of both knees   [facility] [place], [place], DO   4 months ago Essential hypertension   [place], FNP             Signed Prescriptions Disp Refills   furosemide (LASIX) 40 MG tablet 90 tablet 0    Sig: Take 2 tablets in the am; take 1 tablet in the pm Take 40 mg by mouth.     Cardiovascular:  Diuretics - Loop Passed - 09/19/2019 10:51 AM      Passed - K in normal range and within 360 days    Potassium  Date Value Ref Range Status  05/12/2019 3.8 3.5 - 5.1 mEq/L Final         Passed - Ca in normal range and within 360 days    Calcium  Date Value Ref Range Status  05/12/2019 9.3 8.4 - 10.5 mg/dL Final         Passed - Na in normal range and within 360  days    Sodium  Date Value Ref Range Status  05/12/2019 139 135 - 145 mEq/L Final         Passed - Cr in normal range and within 360 days    Creatinine, Ser  Date Value Ref Range Status  05/12/2019 1.07 0.40 - 1.20 mg/dL Final         Passed - Last BP in normal range    BP Readings from Last 1 Encounters:  07/09/19 118/74         Passed - Valid encounter within last 6 months    Recent Outpatient Visits          2 months ago Primary osteoarthritis of both [place], [place], DO   3 months ago Chronic pain of both knees   [place], [place], DO   4 months ago Essential hypertension   [employer] technical [employer]  Care -[facility] Sports, Marvis Repress, [place]

## 2019-09-26 NOTE — Telephone Encounter (Signed)
Spoke with patient 09/25/2019. Discussed future PCP preferences and will assist her in finding a new PCP within [facility].

## 2019-10-13 NOTE — Telephone Encounter (Signed)
Discussed with Practice Admins at [facility] and [place], Inda Coke recommended for [facility] appt. Scheduled with pt for January 11, given [facility] address and phone # and sent link to sign up for MyChart.

## 2019-10-27 ENCOUNTER — Ambulatory Visit (INDEPENDENT_AMBULATORY_CARE_PROVIDER_SITE_OTHER): Payer: Managed Care, Other (non HMO) | Admitting: Physician Assistant

## 2019-10-27 ENCOUNTER — Other Ambulatory Visit: Payer: Self-pay

## 2019-10-27 ENCOUNTER — Encounter: Payer: Self-pay | Admitting: Physician Assistant

## 2019-10-27 VITALS — BP 100/60 | HR 68 | Temp 97.9°F | Ht 66.5 in | Wt 369.4 lb

## 2019-10-27 DIAGNOSIS — F320 Major depressive disorder, single episode, mild: Secondary | ICD-10-CM

## 2019-10-27 DIAGNOSIS — I10 Essential (primary) hypertension: Secondary | ICD-10-CM | POA: Diagnosis not present

## 2019-10-27 DIAGNOSIS — Z8679 Personal history of other diseases of the circulatory system: Secondary | ICD-10-CM | POA: Diagnosis not present

## 2019-10-27 DIAGNOSIS — E669 Obesity, unspecified: Secondary | ICD-10-CM | POA: Diagnosis not present

## 2019-10-27 DIAGNOSIS — F419 Anxiety disorder, unspecified: Secondary | ICD-10-CM

## 2019-10-27 NOTE — Patient Instructions (Addendum)
It was great to see you!  Lets recheck your kidney function and we will be in touch about refills and follow-up after this.   Take care,  Inda Coke PA-C

## 2019-10-27 NOTE — Progress Notes (Signed)
Janet [name] is a 59 y.o. female is here for transfer of care.  I acted as a Education administrator for [employer], PA-C Anselmo Pickler, LPN  History of Present Illness:   Chief Complaint  Patient presents with  . Transfer of care  . Hypertension   147  HPI  Pt is here today for transfer of care from Dr. Tamala Julian.  Hypertension Pt following up today on blood pressure. She is currently on Lisinopril 20mg , Lasix 80 mg in AM and 40 mg in PM. Pt is checking blood pressure daily averaging 109/68. Today was 104/66. Pt denies headaches, dizziness, blurred vision, chest pain, SOB. She does have lower extremity edema. Denies excessive caffeine intake, stimulant usage, excessive alcohol intake or increase in salt consumption.  Last GFR shows slight decrease of GFR to 52 in July 2020.  History of irregular heart Intermittently she has episodes where her heart speeds up. A few years ago had stress test and full work-up and everything was negative. Was given an rx for diltiazem (unknown dosage) and -- was told to take these if HR >150 for more than 10 minutes.  If after one hour, it is not improved, go to the ER. Has been on this regimen for a few years, has only had to take 3 tablets total. Strong family hx of atrial fibrillation. Says that she possibly had atrial fibrillation during these episodes. States that she had a normal echo and stress test.  Obesity Working on lower carbohydrate portions, higher vegetable x past 2 weeks -- has had some weight loss recently because of these (around 5-10 lb). Started this two weeks ago.  Was put on diet pills at age 52. Prior successful results included a lot of physical activity. Has had issues with b/l knees that has limited her activity.   Wt Readings from Last 5 Encounters:  10/27/19 (!) 369 lb 6.1 oz (167.5 kg)  07/09/19 (!) 373 lb (169.2 kg)  06/05/19 (!) 372 lb (168.7 kg)  05/12/19 (!) 379 lb 3.2 oz (172 kg)  04/09/13 (!) 335 lb (152 kg)   Anxiety  and depression Was initially started on prozac in 1990; was on for 1 year, then stopped due to finances. Started on celexa 10 mg after brother and mother passed away. Feels stable on her medication presently. Denies SI/HI.  GAD 7 : Generalized Anxiety Score 10/27/2019  Nervous, Anxious, on Edge 2  Control/stop worrying 0  Worry too much - different things 0  Trouble relaxing 0  Restless 0  Easily annoyed or irritable 0  Afraid - awful might happen 0  Total GAD 7 Score 2  Anxiety Difficulty Not difficult at all    Depression screen [facility] 2/9 10/27/2019  Decreased Interest 0  Down, Depressed, Hopeless 0  PHQ - 2 Score 0  Altered sleeping 1  Tired, decreased energy 0  Change in appetite 0  Feeling bad or failure about yourself  0  Trouble concentrating 0  Moving slowly or fidgety/restless 0  Suicidal thoughts 0  PHQ-9 Score 1  Difficult doing work/chores Not difficult at all      Health Maintenance Due  Topic Date Due  . Hepatitis C Screening  1960/11/30  . HIV Screening  11/07/1976  . INFLUENZA VACCINE  05/17/2019    Past Medical History:  Diagnosis Date  . Anemia    history of anemia from AUB  . Anxiety   . Breast cancer ([place]) 2012   left breast/  NO CHEMO  .  Complication of anesthesia    difficult IV access- never needed PICC line  . Depression    no meds  . Edema of both legs   . Hypertension    ekg 1/13 on chart  . Pneumonia 1989   hx  . PONV (postoperative nausea and vomiting)   . Umbilical hernia    currently - not repaired yet     Social History   Socioeconomic History  . Marital status: Single    Spouse name: Not on file  . Number of children: Not on file  . Years of education: Not on file  . Highest education level: Not on file  Occupational History  . Not on file  Tobacco Use  . Smoking status: Never Smoker  . Smokeless tobacco: Never Used  Substance and Sexual Activity  . Alcohol use: No  . Drug use: No  . Sexual activity: Never     Birth control/protection: None  Other Topics Concern  . Not on file  Social History Narrative   Files tax returns; 2 years   Trained as bookkeeper and Press photographer   Social Determinants of Health   Financial Resource Strain:   . Difficulty of Paying Living Expenses: Not on file  Food Insecurity:   . Worried About Charity fundraiser in the Last Year: Not on file  . Ran Out of Food in the Last Year: Not on file  Transportation Needs:   . Lack of Transportation (Medical): Not on file  . Lack of Transportation (Non-Medical): Not on file  Physical Activity:   . Days of Exercise per Week: Not on file  . Minutes of Exercise per Session: Not on file  Stress:   . Feeling of Stress : Not on file  Social Connections:   . Frequency of Communication with Friends and Family: Not on file  . Frequency of Social Gatherings with Friends and Family: Not on file  . Attends Religious Services: Not on file  . Active Member of Clubs or Organizations: Not on file  . Attends Archivist Meetings: Not on file  . Marital Status: Not on file  Intimate Partner Violence:   . Fear of Current or Ex-Partner: Not on file  . Emotionally Abused: Not on file  . Physically Abused: Not on file  . Sexually Abused: Not on file    Past Surgical History:  Procedure Laterality Date  . ABDOMINAL HYSTERECTOMY  04/30/2012   Procedure: HYSTERECTOMY ABDOMINAL;  Surgeon: Imagene Gurney A. Alycia Rossetti, MD;  Location: [facility];  Service: Gynecology;  Laterality: N/A;  . BREAST CAPSULECTOMY WITH IMPLANT EXCHANGE  10/02/2012   Procedure: BREAST CAPSULECTOMY WITH IMPLANT EXCHANGE;  Surgeon: Theodoro Kos, DO;  Location: [place];  Service: Plastics;  Laterality: Left;   LEFT EXPANDER REMOVAL WITH CAPSULECTOMY AND PLACEMENT OF IMPLANT  . BREAST IMPLANT EXCHANGE Left 01/20/2013   Procedure: REVISION OF LEFT BREAST  IMPLANT ;  Surgeon: Theodoro Kos, DO;  Location: [place];  Service: Plastics;  Laterality: Left;  . BREAST  REDUCTION SURGERY N/A 01/20/2013   Procedure: Abdominal fat graft to breast reconstruction area;  Surgeon: Theodoro Kos, DO;  Location: [place];  Service: Plastics;  Laterality: N/A;  . BREAST SURGERY  2012   lt mastectomy/ axillary dissection  . BREAST SURGERY  2012   tissue expander lt  . COLONOSCOPY    . DILATION AND CURETTAGE OF UTERUS  01/26/2012   Procedure: DILATATION AND CURETTAGE;  Surgeon: Lahoma Crocker, MD;  Location: [facility];  Service: Gynecology;;  . ESOPHAGOGASTRODUODENOSCOPY    . HERNIA REPAIR    . LAPAROTOMY  04/30/2012   Procedure: EXPLORATORY LAPAROTOMY;  Surgeon: Imagene Gurney A. Alycia Rossetti, MD;  Location: [facility];  Service: Gynecology;  Laterality: N/A;  . MASTECTOMY     Left side  . MASTOPEXY  10/02/2012   Procedure: MASTOPEXY;  Surgeon: Theodoro Kos, DO;  Location: [place];  Service: Plastics;  Laterality: Right;  RIGHT BREAST MASTOPEXY REDUCTION FOR SYMMETRY,  . MULTIPLE EXTRACTIONS WITH ALVEOLOPLASTY  10/23/2011  . MULTIPLE TOOTH EXTRACTIONS    . NOVASURE ABLATION  03/01/2012  . RECONSTRUCTION BREAST IMMEDIATE / DELAYED W/ TISSUE EXPANDER     tissue expander lt-9/12  . SALPINGOOPHORECTOMY  04/30/2012   Procedure: SALPINGO OOPHERECTOMY;  Surgeon: Imagene Gurney A. Alycia Rossetti, MD;  Location: [facility];  Service: Gynecology;  Laterality: Bilateral;  . TOE OSTEOTOMY     rt toe  . VENTRAL HERNIA REPAIR  04/30/2012   Procedure: HERNIA REPAIR VENTRAL ADULT;  Surgeon: Imagene Gurney A. Alycia Rossetti, MD;  Location: [facility];  Service: Gynecology;  Laterality: N/A;    Family History  Problem Relation Age of Onset  . Atrial fibrillation Mother   . Depression Mother   . COPD Mother   . Heart attack Mother   . Prostate cancer Father        mets  . Heart attack Brother   . Atrial fibrillation Maternal Grandmother     PMHx, SurgHx, SocialHx, FamHx, Medications, and Allergies were reviewed in the Visit Navigator and updated as appropriate.   Patient Active Problem List   Diagnosis Date Noted  .  Degenerative arthritis of knee, bilateral 06/05/2019  . Essential hypertension 05/12/2019  . Hyperlipidemia 05/12/2019  . Pedal edema 05/12/2019  . Atrial fibrillation, transient ([place]) 05/12/2019  . Morbid obesity ([place]) 05/12/2019  . Malignant neoplasm of female breast ([place]) 05/12/2019  . Routine general medical examination at a health care facility 04/09/2013  . Status post left breast reconstruction 07/21/2011    Social History   Tobacco Use  . Smoking status: Never Smoker  . Smokeless tobacco: Never Used  Substance Use Topics  . Alcohol use: No  . Drug use: No    Current Medications and Allergies:    Current Outpatient Medications:  .  Ascorbic Acid (VITAMIN C) 1000 MG tablet, Take 1,000 mg by mouth daily., Disp: , Rfl:  .  aspirin 325 MG tablet, Take 325 mg by mouth daily., Disp: , Rfl:  .  cholecalciferol (VITAMIN D3) 25 MCG (1000 UT) tablet, Take 1 tablet (1,000 Units total) by mouth daily., Disp: 90 tablet, Rfl: 0 .  citalopram (CELEXA) 10 MG tablet, Take 1 tablet (10 mg total) by mouth daily., Disp: 90 tablet, Rfl: 3 .  furosemide (LASIX) 40 MG tablet, Take 2 tablets in the am; take 1 tablet in the pm Take 40 mg by mouth., Disp: 90 tablet, Rfl: 0 .  lisinopril (ZESTRIL) 20 MG tablet, TAKE ONE (1) TABLET BY MOUTH EVERY DAY (Patient taking differently: Take 20 mg by mouth daily. ), Disp: 90 tablet, Rfl: 1 .  Misc Natural Products (TART CHERRY ADVANCED) CAPS, Take 1,200 capsules by mouth daily., Disp: , Rfl:  .  Multiple Vitamin (MULTIVITAMIN WITH MINERALS) TABS, Take 1 tablet by mouth daily., Disp: , Rfl:  .  Turmeric 500 MG CAPS, Take 1 capsule by mouth daily., Disp: , Rfl:  .  vitamin B-12 (CYANOCOBALAMIN) 100 MCG tablet, Take 200 mcg by mouth daily. , Disp: ,  Rfl:  .  Vitamin D, Ergocalciferol, (DRISDOL) 1.25 MG (50000 UNIT) CAPS capsule, Take 50,000 Units by mouth every 7 (seven) days., Disp: , Rfl:    Allergies  Allergen Reactions  . Amoxicillin Other (See Comments)     Decreased heart rate  . Clarithromycin Other (See Comments)    Breaks mouth out  . Ivp Dye [Iodinated Diagnostic Agents] Other (See Comments)    States made bp systolic has increased and diastolic has decreased and hasnt felt well x 3 weeks since dye adm  . Tape Dermatitis    Only can have paper tape    Review of Systems   ROS  Negative unless otherwise specified per HPI.  Vitals:   Vitals:   10/27/19 1341  BP: 100/60  Pulse: 68  Temp: 97.9 F (36.6 C)  TempSrc: Temporal  SpO2: 96%  Weight: (!) 369 lb 6.1 oz (167.5 kg)  Height: 5' 6.5" (1.689 m)     Body mass index is 58.73 kg/m.   Physical Exam:    Physical Exam Vitals and nursing note reviewed.  Constitutional:      General: She is not in acute distress.    Appearance: She is well-developed. She is not ill-appearing or toxic-appearing.  Cardiovascular:     Rate and Rhythm: Normal rate and regular rhythm.     Pulses: Normal pulses.     Heart sounds: Normal heart sounds, S1 normal and S2 normal.     Comments: No LE edema Pulmonary:     Effort: Pulmonary effort is normal.     Breath sounds: Normal breath sounds.  Skin:    General: Skin is warm and dry.  Neurological:     Mental Status: She is alert.     GCS: GCS eye subscore is 4. GCS verbal subscore is 5. GCS motor subscore is 6.  Psychiatric:        Speech: Speech normal.        Behavior: Behavior normal. Behavior is cooperative.      Assessment and Plan:    Hagan was seen today for transfer of care and hypertension.  Diagnoses and all orders for this visit:  Essential hypertension Well controlled, continue lisinopril 20 mg. May need to adjust lasix dosage based upon lab results, will update BMP today. If stable or improved, will have her follow-up in 6 months, sooner if concerns. -     Basic metabolic panel  History of irregular heartbeat Possible atrial fibrillation in the past. Reviewed her CHADSVASC score of 2. She is currently on ASA 325  mg daily. We discussed risks for stroke with score of 2 or greater and not on rx anticoagulation, she understands risks (stroke, MI, death, among others) and does not want to start this at this time. She plans to return to office if symptoms increase, worsen, or if she changes her mind.  Obesity, unspecified classification, unspecified obesity type, unspecified whether serious comorbidity present Continue to work on diet modifications.  Anxiety; Depression, major, single episode, mild ([place]) Currently well controlled. Denies SI/HI. Continue celexa 10mg  daily. Follow-up in 6 months, sooner if concerns.  . Reviewed expectations re: course of current medical issues. . Discussed self-management of symptoms. . Outlined signs and symptoms indicating need for more acute intervention. . Patient verbalized understanding and all questions were answered. . See orders for this visit as documented in the electronic medical record. . Patient received an After Visit Summary.  CMA or LPN served as scribe during this visit. History, Physical,  and Plan performed by medical provider. The above documentation has been reviewed and is accurate and complete.  Over 60 minutes were spent face-to-face with the patient during this encounter and >50% of that time was spent on counseling and coordination of care  Inda Coke, PA-C [place], [place] [facility] 10/27/2019  Follow-up: No follow-ups on file.

## 2019-10-28 ENCOUNTER — Other Ambulatory Visit: Payer: Self-pay | Admitting: Physician Assistant

## 2019-10-28 LAB — BASIC METABOLIC PANEL
BUN: 16 mg/dL (ref 6–23)
CO2: 26 mEq/L (ref 19–32)
Calcium: 9.5 mg/dL (ref 8.4–10.5)
Chloride: 100 mEq/L (ref 96–112)
Creatinine, Ser: 0.93 mg/dL (ref 0.40–1.20)
GFR: 61.93 mL/min (ref >60.00)
Glucose, Bld: 98 mg/dL (ref 70–99)
Potassium: 4.1 mEq/L (ref 3.5–5.1)
Sodium: 137 mEq/L (ref 135–145)

## 2019-10-28 MED ORDER — CITALOPRAM HYDROBROMIDE 10 MG PO TABS: 10.0000 mg | ORAL_TABLET | Freq: Every day | ORAL | 1 refills | Status: DC

## 2019-10-28 MED ORDER — VITAMIN D 25 MCG (1000 UNIT) PO TABS: 1000.0000 [IU] | ORAL_TABLET | Freq: Every day | ORAL | 1 refills | Status: DC

## 2019-10-28 MED ORDER — LISINOPRIL 20 MG PO TABS: ORAL_TABLET | ORAL | 1 refills | Status: AC

## 2019-10-28 MED ORDER — FUROSEMIDE 40 MG PO TABS: ORAL_TABLET | ORAL | 1 refills | Status: DC

## 2020-08-07 ENCOUNTER — Other Ambulatory Visit: Payer: Self-pay | Admitting: Physician Assistant

## 2020-08-07 ENCOUNTER — Other Ambulatory Visit: Payer: Self-pay | Admitting: Family

## 2021-03-01 ENCOUNTER — Other Ambulatory Visit: Payer: Self-pay | Admitting: Physician Assistant

## 2021-06-13 ENCOUNTER — Other Ambulatory Visit: Payer: Self-pay

## 2021-06-13 ENCOUNTER — Other Ambulatory Visit: Payer: Self-pay | Admitting: Family Medicine

## 2021-06-13 DIAGNOSIS — F32A Depression, unspecified: Secondary | ICD-10-CM | POA: Insufficient documentation

## 2021-06-13 DIAGNOSIS — T8859XA Other complications of anesthesia, initial encounter: Secondary | ICD-10-CM | POA: Insufficient documentation

## 2021-06-13 DIAGNOSIS — R112 Nausea with vomiting, unspecified: Secondary | ICD-10-CM | POA: Insufficient documentation

## 2021-06-13 DIAGNOSIS — Z9889 Other specified postprocedural states: Secondary | ICD-10-CM | POA: Insufficient documentation

## 2021-06-13 DIAGNOSIS — D649 Anemia, unspecified: Secondary | ICD-10-CM | POA: Insufficient documentation

## 2021-06-13 DIAGNOSIS — K429 Umbilical hernia without obstruction or gangrene: Secondary | ICD-10-CM | POA: Insufficient documentation

## 2021-06-14 ENCOUNTER — Encounter: Payer: Self-pay | Admitting: Cardiology

## 2021-06-14 ENCOUNTER — Ambulatory Visit (INDEPENDENT_AMBULATORY_CARE_PROVIDER_SITE_OTHER): Payer: Managed Care, Other (non HMO) | Admitting: Cardiology

## 2021-06-14 ENCOUNTER — Ambulatory Visit (INDEPENDENT_AMBULATORY_CARE_PROVIDER_SITE_OTHER): Payer: Managed Care, Other (non HMO)

## 2021-06-14 ENCOUNTER — Other Ambulatory Visit: Payer: Self-pay

## 2021-06-14 VITALS — BP 136/82 | HR 77 | Ht 65.0 in | Wt 372.4 lb

## 2021-06-14 DIAGNOSIS — R0600 Dyspnea, unspecified: Secondary | ICD-10-CM

## 2021-06-14 DIAGNOSIS — R002 Palpitations: Secondary | ICD-10-CM | POA: Insufficient documentation

## 2021-06-14 DIAGNOSIS — I10 Essential (primary) hypertension: Secondary | ICD-10-CM

## 2021-06-14 DIAGNOSIS — E785 Hyperlipidemia, unspecified: Secondary | ICD-10-CM

## 2021-06-14 DIAGNOSIS — R0609 Other forms of dyspnea: Secondary | ICD-10-CM

## 2021-06-14 NOTE — Progress Notes (Signed)
Ekg

## 2021-06-14 NOTE — Addendum Note (Signed)
Addended by: Darrel Reach on: 06/14/2021 03:34 PM   Modules accepted: Orders

## 2021-06-14 NOTE — Patient Instructions (Signed)
Medication Instructions:  Your physician recommends that you continue on your current medications as directed. Please refer to the Current Medication list given to you today.  *If you need a refill on your cardiac medications before your next appointment, please call your pharmacy*   Lab Work: None If you have labs (blood work) drawn today and your tests are completely normal, you will receive your results only by: [place] (if you have MyChart) OR A paper copy in the mail If you have any lab test that is abnormal or we need to change your treatment, we will call you to review the results.   Testing/Procedures: Your physician has requested that you have an echocardiogram. Echocardiography is a painless test that uses sound waves to create images of your heart. It provides your doctor with information about the size and shape of your heart and how well your heart's chambers and valves are working. This procedure takes approximately one hour. There are no restrictions for this procedure.   ZIO XT- Long Term Monitor Instructions  Your physician has requested you wear a ZIO patch monitor for 14 days.  This is a single patch monitor. [employer] supplies one patch monitor per enrollment. Additional stickers are not available. Please do not apply patch if you will be having a Nuclear Stress Test,  Echocardiogram, Cardiac CT, MRI, or Chest Xray during the period you would be wearing the  monitor. The patch cannot be worn during these tests. You cannot remove and re-apply the  ZIO XT patch monitor.  Your ZIO patch monitor will be mailed 3 day USPS to your address on file. It may take 3-5 days  to receive your monitor after you have been enrolled.  Once you have received your monitor, please review the enclosed instructions. Your monitor  has already been registered assigning a specific monitor serial # to you.  Follow-Up: At [facility], you and your health needs are our priority.  As  part of our continuing mission to provide you with exceptional heart care, we have created designated Provider Care Teams.  These Care Teams include your primary Cardiologist (physician) and Advanced Practice Providers (APPs -  Physician Assistants and Nurse Practitioners) who all work together to provide you with the care you need, when you need it.  We recommend signing up for the patient portal called "MyChart".  Sign up information is provided on this After Visit Summary.  MyChart is used to connect with patients for Virtual Visits (Telemedicine).  Patients are able to view lab/test results, encounter notes, upcoming appointments, etc.  Non-urgent messages can be sent to your provider as well.   To learn more about what you can do with MyChart, go to [email or web address].    Your next appointment:  2 month(s)  The format for your next appointment:   In Person  Provider:   Jenne Campus, MD   Other Instructions

## 2021-06-14 NOTE — Progress Notes (Signed)
Cardiology Consultation:    Date:  06/14/2021   ID:  Janet [name], DOB Jan 19, 1961, MRN [id number]  PCP:  Inda Coke, [place]  Cardiologist:  Jenne Campus, MD   Referring MD: Inda Coke, [place]   Chief Complaint  Patient presents with   Elevated HR    History of Present Illness:    Janet [name] is a 60 y.o. female who is being seen today for the evaluation of palpitations at the request of Inda Coke, [place].  I know her for years she is to bring her mother to me her medical history of atrial fibrillation.  Patient is a 60 years old woman who has been experiencing palpitations for many years she tells me that years ago she ended up going to the hospital she was found to be in atrial fibrillation since that time she described to have palpitations every 6 months also this palpitation can last for few hours.  Recently she ended up going to the emergency room because of palpitations that did not go awayAfter night of sleeping.  She became concerned about it and she decided to come to the emergency room.  In the emergency room she was finding normal rhythm.  She was discharged home very quickly.  In 2020 she was in the hospital Quite extensive evaluation done that was for palpitations again but no atrial fibrillation documentation at that time.  She did have stress test and echocardiogram both were normal she was sent home.  She snores probably some.  She is morbidly obese.  She does not do much.  She used to cut a little grass but now she does not because she is working hard.  Denies have any chest pain tightness squeezing pressure burning chest.  She does have swelling of lower extremities she if she eats too much salt.  She never smoked she hates smoking.  Her father smokes 3 packs/day.  Past Medical History:  Diagnosis Date   Anemia    history of anemia from AUB   Anxiety    Breast cancer ([place]) 2012   left breast/  NO CHEMO   Complication of anesthesia    difficult IV  access- never needed PICC line   Depression    no meds   Edema of both legs    Hypertension    ekg 1/13 on chart   Pneumonia 1989   hx   PONV (postoperative nausea and vomiting)    Umbilical hernia    currently - not repaired yet    Past Surgical History:  Procedure Laterality Date   ABDOMINAL HYSTERECTOMY  04/30/2012   Procedure: HYSTERECTOMY ABDOMINAL;  Surgeon: Imagene Gurney A. Alycia Rossetti, MD;  Location: [facility];  Service: Gynecology;  Laterality: N/A;   BREAST CAPSULECTOMY WITH IMPLANT EXCHANGE  10/02/2012   Procedure: BREAST CAPSULECTOMY WITH IMPLANT EXCHANGE;  Surgeon: Theodoro Kos, DO;  Location: [place];  Service: Plastics;  Laterality: Left;   LEFT EXPANDER REMOVAL WITH CAPSULECTOMY AND PLACEMENT OF IMPLANT   BREAST IMPLANT EXCHANGE Left 01/20/2013   Procedure: REVISION OF LEFT BREAST  IMPLANT ;  Surgeon: Theodoro Kos, DO;  Location: [place];  Service: Plastics;  Laterality: Left;   BREAST REDUCTION SURGERY N/A 01/20/2013   Procedure: Abdominal fat graft to breast reconstruction area;  Surgeon: Theodoro Kos, DO;  Location: [place];  Service: Plastics;  Laterality: N/A;   BREAST SURGERY  2012   lt mastectomy/ axillary dissection   BREAST SURGERY  2012   tissue expander lt  COLONOSCOPY     DILATION AND CURETTAGE OF UTERUS  01/26/2012   Procedure: DILATATION AND CURETTAGE;  Surgeon: Lahoma Crocker, MD;  Location: [facility];  Service: Gynecology;;   ESOPHAGOGASTRODUODENOSCOPY     HERNIA REPAIR     LAPAROTOMY  04/30/2012   Procedure: EXPLORATORY LAPAROTOMY;  Surgeon: Imagene Gurney A. Alycia Rossetti, MD;  [facility];  Service: Gynecology;  Laterality: N/A;   MASTECTOMY     Left side   MASTOPEXY  10/02/2012   Procedure: MASTOPEXY;  Surgeon: Theodoro Kos, DO;  Location: [place];  Service: Plastics;  Laterality: Right;  RIGHT BREAST MASTOPEXY REDUCTION FOR SYMMETRY,   MULTIPLE EXTRACTIONS WITH ALVEOLOPLASTY  10/23/2011   MULTIPLE TOOTH EXTRACTIONS     NOVASURE ABLATION   03/01/2012   RECONSTRUCTION BREAST IMMEDIATE / DELAYED W/ TISSUE EXPANDER     tissue expander lt-9/12   SALPINGOOPHORECTOMY  04/30/2012   Procedure: SALPINGO OOPHERECTOMY;  Surgeon: Imagene Gurney A. Alycia Rossetti, MD;  [facility];  Service: Gynecology;  Laterality: Bilateral;   TOE OSTEOTOMY     rt toe   VENTRAL HERNIA REPAIR  04/30/2012   Procedure: HERNIA REPAIR VENTRAL ADULT;  Surgeon: Imagene Gurney A. Alycia Rossetti, MD;  [facility];  Service: Gynecology;  Laterality: N/A;    Current Medications: Current Meds  Medication Sig   Ascorbic Acid (VITAMIN C) 1000 MG tablet Take 1,000 mg by mouth 2 (two) times daily.   aspirin 325 MG tablet Take 325 mg by mouth daily.   Calcium Carb-Cholecalciferol (CALCIUM 500/D PO) Take 600 mg by mouth daily.   diltiazem (CARDIZEM) 120 MG tablet Take 120 mg by mouth as needed (Take if high and stay high for more than 2 hours).   FLUoxetine (PROZAC) 10 MG tablet Take 10 mg by mouth daily.   furosemide (LASIX) 40 MG tablet TAKE TWO (2) TABLETS BY MOUTH EVERY MORNING (Patient taking differently: Take 80 mg by mouth daily. TAKE TWO (2) TABLETS BY MOUTH EVERY MORNING)   Inulin (FIBER CHOICE PO) Take 1 tablet by mouth daily. [place]   lisinopril (ZESTRIL) 20 MG tablet TAKE ONE (1) TABLET BY MOUTH EVERY DAY (Patient taking differently: Take 20 mg by mouth daily. TAKE ONE (1) TABLET BY MOUTH EVERY DAY)   Misc Natural Products (TART CHERRY ADVANCED) CAPS Take 1,200 capsules by mouth daily.   Multiple Vitamin (MULTIVITAMIN WITH MINERALS) TABS Take 1 tablet by mouth daily.   Potassium 99 MG TABS Take 99 mg by mouth daily.   Turmeric 500 MG CAPS Take 1 capsule by mouth daily.   vitamin B-12 (CYANOCOBALAMIN) 100 MCG tablet Take 50 mcg by mouth 2 (two) times daily.   Vitamin D, Ergocalciferol, (DRISDOL) 1.25 MG (50000 UNIT) CAPS capsule Take 10,000 Units by mouth every 7 (seven) days.     Allergies:   Petrolatum-zinc oxide, Amoxicillin, Clarithromycin, Ivp dye [iodinated diagnostic agents], and  Tape   Social History   Socioeconomic History   Marital status: Single    Spouse name: Not on file   Number of children: Not on file   Years of education: Not on file   Highest education level: Not on file  Occupational History   Not on file  Tobacco Use   Smoking status: Never   Smokeless tobacco: Never  Substance and Sexual Activity   Alcohol use: No   Drug use: No   Sexual activity: Never    Birth control/protection: None  Other Topics Concern   Not on file  Social History Narrative   Files  tax returns; 2 years   Trained as bookkeeper and Press photographer   Social Determinants of Radio broadcast assistant Strain: Not on file  Food Insecurity: Not on file  Transportation Needs: Not on file  Physical Activity: Not on file  Stress: Not on file  Social Connections: Not on file     Family History: The patient's family history includes Atrial fibrillation in her maternal grandmother and mother; COPD in her mother; Depression in her mother; Heart attack in her brother and mother; Prostate cancer in her father. ROS:   Please see the history of present illness.    All 14 point review of systems negative except as described per history of present illness.  EKGs/Labs/Other Studies Reviewed:    The following studies were reviewed today: I did review extensive record from hospital  EKG:  EKG is  ordered today.  The ekg ordered today demonstrates normal sinus rhythm, normal P interval, normal QS complex duration morphology, left axis deviation, criteria for LVH.  Recent Labs: No results found for requested labs within last 8760 hours.  Recent Lipid Panel    Component Value Date/Time   CHOL 225 (H) 05/12/2019 0949   TRIG 166.0 (H) 05/12/2019 0949   HDL 46.20 05/12/2019 0949   CHOLHDL 5 05/12/2019 0949   VLDL 33.2 05/12/2019 0949   LDLCALC 145 (H) 05/12/2019 0949    Physical Exam:    VS:  BP 136/82 (BP Location: Left Arm, Patient Position: Sitting)   Pulse 77   Ht 5'  5" (1.651 m)   Wt (!) 372 lb 6.4 oz (168.9 kg)   LMP 03/15/2012   SpO2 97%   BMI 61.97 kg/m     Wt Readings from Last 3 Encounters:  06/14/21 (!) 372 lb 6.4 oz (168.9 kg)  10/27/19 (!) 369 lb 6.1 oz (167.5 kg)  07/09/19 (!) 373 lb (169.2 kg)     GEN:  Well nourished, well developed in no acute distress HEENT: Normal NECK: No JVD; No carotid bruits LYMPHATICS: No lymphadenopathy CARDIAC: RRR, no murmurs, no rubs, no gallops RESPIRATORY:  Clear to auscultation without rales, wheezing or rhonchi  ABDOMEN: Soft, non-tender, non-distended MUSCULOSKELETAL:  No edema; No deformity  SKIN: Warm and dry NEUROLOGIC:  Alert and oriented x 3 PSYCHIATRIC:  Normal affect   ASSESSMENT:    1. Hyperlipidemia, unspecified hyperlipidemia type   2. Palpitations   3. Essential hypertension    PLAN:    In order of problems listed above:  Palpitations with some diagnosis of paroxysmal atrial fibrillation but I did review her extensive records in the hospital I do not see any documentation for it.  I will ask her to wear Zio patch for 2 weeks to see exactly what kind of palpitations/arrhythmia we dealing with.  I start talking to her about potentially getting apple watch that will allow her to record EKG if she got palpitations.  Typically this palpitation lasting long time couple hours so I also told her if she develop palpitations she need to come to the office immediately so we can do EKG.  Interestingly she discovered her only to drink with the palpitations she usually drink a lot of cold water and that usually interrupt her arrhythmia.  I will also schedule her to have an echocardiogram to assess left ventricular ejection fraction.  I will not alter any of her medications right now.  She is taking Cardizem CD 120.  I will continue with this.  I do not see indication for  anticoagulation yet.  Her CHADS2 Vascor equals 2.  Overall relatively low risk.  I need to have approval of atrial fibrillation  before we start talking about potentially anticoagulation. Essential hypertension blood pressure seems to be well controlled.  She is on Zestril as well as Cardizem and Lasix which I will continue. Dyslipidemia.  She will call me later and tell me exactly what her fasting lipid profile is apparently she tells me that she was checked recently everything seems to be good.   Medication Adjustments/Labs and Tests Ordered: Current medicines are reviewed at length with the patient today.  Concerns regarding medicines are outlined above.  No orders of the defined types were placed in this encounter.  No orders of the defined types were placed in this encounter.   Signed, Park Liter, MD, [facility]. 06/14/2021 3:05 PM    [facility]

## 2021-06-27 IMAGING — DX BILATERAL KNEES STANDING - 1 VIEW
1 series · 1 of 1 positions shown · non-contrast
Comparison: None.

CLINICAL DATA: Bilateral knee pain

EXAM:
BILATERAL KNEES STANDING - 1 VIEW

[knee ap]
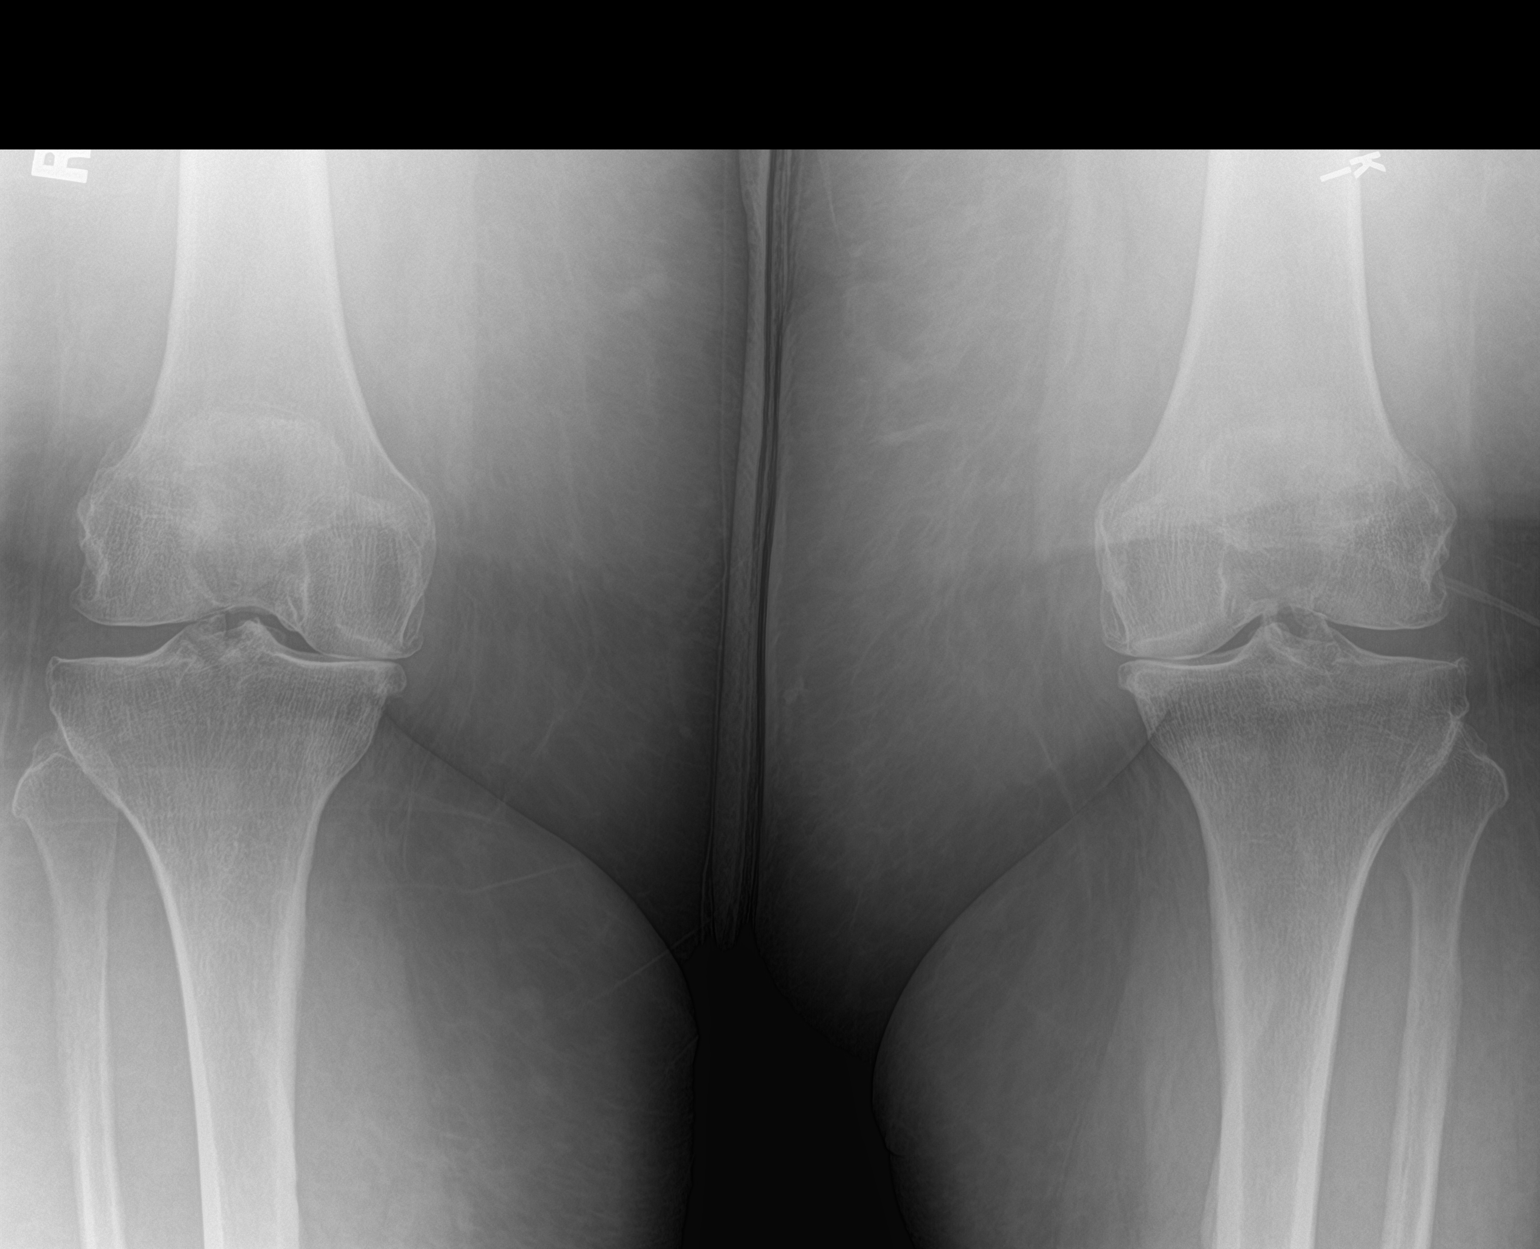

[1 of 1 positions shown; findings below may reference images not displayed]

FINDINGS: No fracture allowing for single-view. Advanced degenerative change
involving the medial compartments of both knees with narrowing and
osteophyte. Mild compensatory widening of the lateral joint spaces.
IMPRESSION: Advanced arthritis involving the medial joint space compartments of
both knees.

## 2021-07-01 ENCOUNTER — Other Ambulatory Visit: Payer: Self-pay

## 2021-07-01 ENCOUNTER — Ambulatory Visit (INDEPENDENT_AMBULATORY_CARE_PROVIDER_SITE_OTHER): Payer: Managed Care, Other (non HMO)

## 2021-07-01 DIAGNOSIS — R0609 Other forms of dyspnea: Secondary | ICD-10-CM

## 2021-07-01 DIAGNOSIS — R0600 Dyspnea, unspecified: Secondary | ICD-10-CM

## 2021-07-01 LAB — ECHOCARDIOGRAM COMPLETE
Area-P 1/2: 3.39 cm2
S' Lateral: 2.5 cm

## 2021-07-05 ENCOUNTER — Telehealth: Payer: Self-pay

## 2021-07-05 NOTE — Telephone Encounter (Signed)
-----   Message from Park Liter, MD sent at 07/04/2021 11:53 AM EDT ----- Echocardiogram showed preserved left ventricle ejection fraction, ascending aorta measuring 46 mm this is an aneurysm we will discuss this during next visit

## 2021-07-05 NOTE — Telephone Encounter (Signed)
Spoke with patient regarding results and recommendation.  Patient verbalizes understanding and is agreeable to plan of care. Advised patient to call back with any issues or concerns.  

## 2021-07-05 NOTE — Telephone Encounter (Signed)
Left message on patients voicemail to please return our call.   

## 2021-07-07 ENCOUNTER — Other Ambulatory Visit: Payer: Self-pay | Admitting: Oncology

## 2021-07-07 DIAGNOSIS — Z170 Estrogen receptor positive status [ER+]: Secondary | ICD-10-CM

## 2021-07-07 DIAGNOSIS — C50912 Malignant neoplasm of unspecified site of left female breast: Secondary | ICD-10-CM

## 2021-07-08 ENCOUNTER — Inpatient Hospital Stay: Payer: Managed Care, Other (non HMO)

## 2021-07-08 ENCOUNTER — Inpatient Hospital Stay: Payer: Managed Care, Other (non HMO) | Attending: Oncology | Admitting: Oncology

## 2021-07-29 ENCOUNTER — Telehealth: Payer: Self-pay

## 2021-07-29 NOTE — Telephone Encounter (Signed)
-----   Message from Park Liter, MD sent at 07/22/2021  1:12 PM EDT ----- Multiple episodes of supraventricular tachycardia, none of this is dangerous but I recommend to increase dose of Cardizem CD to 180 a day

## 2021-07-29 NOTE — Telephone Encounter (Signed)
Left message on patients voicemail to please return our call.   

## 2021-08-11 ENCOUNTER — Telehealth: Payer: Self-pay | Admitting: Cardiology

## 2021-08-11 MED ORDER — DILTIAZEM HCL 120 MG PO TABS: 120.0000 mg | ORAL_TABLET | Freq: Every day | ORAL | 1 refills | Status: DC

## 2021-08-11 NOTE — Telephone Encounter (Signed)
Pt is returning call for results  Pt will be unavailable until 1:30pm

## 2021-08-11 NOTE — Telephone Encounter (Signed)
Janet Liter, MD  Senaida Ores, RN Multiple episodes of supraventricular tachycardia, none of this is dangerous but I recommend to increase dose of Cardizem CD to 180 a day     Spoke to patient informed her of results above. She does not take cardizem 120 unless heart rate high for more than 2 hours, she also hasn't taken in a long time. Will check how Dr. Agustin Cree wants to proceed. Instead during episodes she tries to cough, or lay down. If it gets too high she will take her medication but she tries to avoid taking. Her heart rate jumps around and she doesn't want to take this and it cause her heart rate to be any lower than it can be at times now. Averages 55-170 (during a episode).

## 2021-08-11 NOTE — Telephone Encounter (Signed)
Spoke to patient informed her of Dr. Agustin Cree note below. She will start cardizem 120 mg daily and see how she does. She will let us know if this doesn't work.

## 2021-09-14 ENCOUNTER — Ambulatory Visit: Payer: Managed Care, Other (non HMO) | Admitting: Cardiology

## 2021-09-14 ENCOUNTER — Encounter: Payer: Self-pay | Admitting: Cardiology

## 2021-09-14 ENCOUNTER — Other Ambulatory Visit: Payer: Self-pay

## 2021-09-14 VITALS — BP 137/86 | HR 66 | Ht 65.0 in | Wt 371.0 lb

## 2021-09-14 DIAGNOSIS — R600 Localized edema: Secondary | ICD-10-CM | POA: Diagnosis not present

## 2021-09-14 DIAGNOSIS — I10 Essential (primary) hypertension: Secondary | ICD-10-CM | POA: Diagnosis not present

## 2021-09-14 DIAGNOSIS — I7121 Aneurysm of the ascending aorta, without rupture: Secondary | ICD-10-CM | POA: Insufficient documentation

## 2021-09-14 DIAGNOSIS — E782 Mixed hyperlipidemia: Secondary | ICD-10-CM | POA: Diagnosis not present

## 2021-09-14 DIAGNOSIS — C50919 Malignant neoplasm of unspecified site of unspecified female breast: Secondary | ICD-10-CM

## 2021-09-14 DIAGNOSIS — Z79899 Other long term (current) drug therapy: Secondary | ICD-10-CM

## 2021-09-14 MED ORDER — TORSEMIDE 20 MG PO TABS: 20.0000 mg | ORAL_TABLET | Freq: Two times a day (BID) | ORAL | 1 refills | Status: DC

## 2021-09-14 NOTE — Addendum Note (Signed)
Addended by: Senaida Ores on: 09/14/2021 11:19 AM   Modules accepted: Orders

## 2021-09-14 NOTE — Progress Notes (Signed)
Cardiology Office Note:    Date:  09/14/2021   ID:  [name], DOB [birth year], MRN [id number]  PCP:  Inda Coke, [place]  Cardiologist:  Jenne Campus, MD    Referring MD: Inda Coke, [place]   Chief Complaint  Patient presents with   Follow-up    History of Present Illness:    Janet [name] is a 60 y.o. female  who has been experiencing palpitations for many years she tells me that years ago she ended up going to the hospital she was found to be in atrial fibrillation since that time she described to have palpitations every 6 months also this palpitation can last for few hours.  Recently she ended up going to the emergency room because of palpitations that did not go awayAfter night of sleeping.  She became concerned about it and she decided to come to the emergency room.  In the emergency room she was finding normal rhythm.  She was discharged home very quickly.  In 2020 she was in the hospital Quite extensive evaluation done that was for palpitations again but no atrial fibrillation documentation at that time.  She did have stress test and echocardiogram both were normal she was sent home.  She snores probably some.  She is morbidly obese. He comes today to my office to talk about her issues.  Overall she described palpitations but those are rare.  She wore a monitor during the time she will monitor there is no palpitations.  She did have echocardiogram done which showed enlargement of the ascending aorta measuring 46 mm.  Past Medical History:  Diagnosis Date   Anemia    history of anemia from AUB   Anxiety    Breast cancer ([place]) 2012   left breast/  NO CHEMO   Complication of anesthesia    difficult IV access- never needed PICC line   Depression    no meds   Edema of both legs    Hypertension    ekg 1/13 on chart   Pneumonia 1989   hx   PONV (postoperative nausea and vomiting)    Umbilical hernia    currently - not repaired yet    Past Surgical  History:  Procedure Laterality Date   ABDOMINAL HYSTERECTOMY  04/30/2012   Procedure: HYSTERECTOMY ABDOMINAL;  Surgeon: Imagene Gurney A. Alycia Rossetti, MD;  [facility];  Service: Gynecology;  Laterality: N/A;   BREAST CAPSULECTOMY WITH IMPLANT EXCHANGE  10/02/2012   Procedure: BREAST CAPSULECTOMY WITH IMPLANT EXCHANGE;  Surgeon: Theodoro Kos, DO;  Location: [place];  Service: Plastics;  Laterality: Left;   LEFT EXPANDER REMOVAL WITH CAPSULECTOMY AND PLACEMENT OF IMPLANT   BREAST IMPLANT EXCHANGE Left 01/20/2013   Procedure: REVISION OF LEFT BREAST  IMPLANT ;  Surgeon: Theodoro Kos, DO;  Location: [place];  Service: Plastics;  Laterality: Left;   BREAST REDUCTION SURGERY N/A 01/20/2013   Procedure: Abdominal fat graft to breast reconstruction area;  Surgeon: Theodoro Kos, DO;  Location: [place];  Service: Plastics;  Laterality: N/A;   BREAST SURGERY  2012   lt mastectomy/ axillary dissection   BREAST SURGERY  2012   tissue expander lt   COLONOSCOPY     DILATION AND CURETTAGE OF UTERUS  01/26/2012   Procedure: DILATATION AND CURETTAGE;  Surgeon: Lahoma Crocker, MD;  Location: [facility];  Service: Gynecology;;   ESOPHAGOGASTRODUODENOSCOPY     HERNIA REPAIR     LAPAROTOMY  04/30/2012   Procedure: EXPLORATORY LAPAROTOMY;  Surgeon: Imagene Gurney  Lenon Oms, MD;  [facility];  Service: Gynecology;  Laterality: N/A;   MASTECTOMY     Left side   MASTOPEXY  10/02/2012   Procedure: MASTOPEXY;  Surgeon: Theodoro Kos, DO;  Location: [place];  Service: Plastics;  Laterality: Right;  RIGHT BREAST MASTOPEXY REDUCTION FOR SYMMETRY,   MULTIPLE EXTRACTIONS WITH ALVEOLOPLASTY  10/23/2011   MULTIPLE TOOTH EXTRACTIONS     NOVASURE ABLATION  03/01/2012   RECONSTRUCTION BREAST IMMEDIATE / DELAYED W/ TISSUE EXPANDER     tissue expander lt-9/12   SALPINGOOPHORECTOMY  04/30/2012   Procedure: SALPINGO OOPHERECTOMY;  Surgeon: Imagene Gurney A. Alycia Rossetti, MD;  [facility];  Service: Gynecology;  Laterality:  Bilateral;   TOE OSTEOTOMY     rt toe   VENTRAL HERNIA REPAIR  04/30/2012   Procedure: HERNIA REPAIR VENTRAL ADULT;  Surgeon: Imagene Gurney A. Alycia Rossetti, MD;  [facility];  Service: Gynecology;  Laterality: N/A;    Current Medications: Current Meds  Medication Sig   Ascorbic Acid (VITAMIN C) 1000 MG tablet Take 1,000 mg by mouth 2 (two) times daily.   aspirin 325 MG tablet Take 325 mg by mouth daily.   Calcium Carb-Cholecalciferol (CALCIUM 500/D PO) Take 600 mg by mouth daily.   diltiazem (CARDIZEM) 120 MG tablet Take 1 tablet (120 mg total) by mouth daily.   FLUoxetine (PROZAC) 10 MG tablet Take 10 mg by mouth daily.   furosemide (LASIX) 40 MG tablet TAKE TWO (2) TABLETS BY MOUTH EVERY MORNING   Inulin (FIBER CHOICE PO) Take 1 tablet by mouth daily. [place]   lisinopril (ZESTRIL) 20 MG tablet TAKE ONE (1) TABLET BY MOUTH EVERY DAY   Misc Natural Products (TART CHERRY ADVANCED) CAPS Take 1,200 capsules by mouth daily.   Multiple Vitamin (MULTIVITAMIN WITH MINERALS) TABS Take 1 tablet by mouth daily.   Potassium 99 MG TABS Take 99 mg by mouth daily.   Turmeric 500 MG CAPS Take 1 capsule by mouth daily.   vitamin B-12 (CYANOCOBALAMIN) 100 MCG tablet Take 50 mcg by mouth 2 (two) times daily.   Vitamin D, Ergocalciferol, (DRISDOL) 1.25 MG (50000 UNIT) CAPS capsule Take 10,000 Units by mouth every 7 (seven) days.     Allergies:   Petrolatum-zinc oxide, Amoxicillin, Clarithromycin, Ivp dye [iodinated diagnostic agents], and Tape   Social History   Socioeconomic History   Marital status: Single    Spouse name: Not on file   Number of children: Not on file   Years of education: Not on file   Highest education level: Not on file  Occupational History   Not on file  Tobacco Use   Smoking status: Never   Smokeless tobacco: Never  Substance and Sexual Activity   Alcohol use: No   Drug use: No   Sexual activity: Never    Birth control/protection: None  Other Topics Concern   Not on file   Social History Narrative   Files tax returns; 2 years   Trained as bookkeeper and Press photographer   Social Determinants of Health   Financial Resource Strain: Not on file  Food Insecurity: Not on file  Transportation Needs: Not on file  Physical Activity: Not on file  Stress: Not on file  Social Connections: Not on file     Family History: The patient's family history includes Atrial fibrillation in her maternal grandmother and mother; COPD in her mother; Depression in her mother; Heart attack in her brother and mother; Prostate cancer in her father. ROS:   Please  see the history of present illness.    All 14 point review of systems negative except as described per history of present illness  EKGs/Labs/Other Studies Reviewed:      Recent Labs: No results found for requested labs within last 8760 hours.  Recent Lipid Panel    Component Value Date/Time   CHOL 225 (H) 05/12/2019 0949   TRIG 166.0 (H) 05/12/2019 0949   HDL 46.20 05/12/2019 0949   CHOLHDL 5 05/12/2019 0949   VLDL 33.2 05/12/2019 0949   LDLCALC 145 (H) 05/12/2019 0949    Physical Exam:    VS:  BP 137/86 (BP Location: Right Arm, Patient Position: Sitting, Cuff Size: Normal)   Pulse 66   Ht 5\' 5"  (1.651 m)   Wt (!) 371 lb (168.3 kg)   LMP 03/15/2012   SpO2 97%   BMI 61.74 kg/m     Wt Readings from Last 3 Encounters:  09/14/21 (!) 371 lb (168.3 kg)  06/14/21 (!) 372 lb 6.4 oz (168.9 kg)  10/27/19 (!) 369 lb 6.1 oz (167.5 kg)     GEN:  Well nourished, well developed in no acute distress HEENT: Normal NECK: No JVD; No carotid bruits LYMPHATICS: No lymphadenopathy CARDIAC: RRR, no murmurs, no rubs, no gallops RESPIRATORY:  Clear to auscultation without rales, wheezing or rhonchi  ABDOMEN: Soft, non-tender, non-distended MUSCULOSKELETAL:  No edema; No deformity  SKIN: Warm and dry LOWER EXTREMITIES: no swelling NEUROLOGIC:  Alert and oriented x 3 PSYCHIATRIC:  Normal affect   ASSESSMENT:    1.  Essential hypertension   2. Aneurysm of ascending aorta without rupture   3. Mixed hyperlipidemia   4. Pedal edema   5. Morbid obesity ([place])   6. Malignant neoplasm of female breast, unspecified estrogen receptor status, unspecified laterality, unspecified site of breast ([place])    PLAN:    In order of problems listed above:  Essential hypertension blood pressure elevated today but she said when she checked it at home it is always good.  I will continue present management. U aneurysm of ascending aorta without rupture.  46 mm.  This is a new discovery.  We will continue monitoring.  She will required initial echocardiogram in about 6-8 1 year later.  She tells me that she did have CT of the chest and abdomen done in the hospital recently and there was no significant mentioning about aneurysm but I will retrieve the test to compare it. Edema.  She thinks that the furosemide does not work anymore, I will switch her to torsemide 20 mg twice daily schedule  Chem-7 Palpitations still ongoing, however the monitor did not show any she did not have palpitation when she wear a monitor I talked to her about getting cardiac telemetry to monitor her EKG if she have some palpitations. Morbid obesity: Obviously a problem. Breast CA she did have mammogram done today, she is followed by oncology.   Medication Adjustments/Labs and Tests Ordered: Current medicines are reviewed at length with the patient today.  Concerns regarding medicines are outlined above.  No orders of the defined types were placed in this encounter.  Medication changes: No orders of the defined types were placed in this encounter.   Signed, Park Liter, MD, [facility] 09/14/2021 11:07 AM    [place]

## 2021-09-14 NOTE — Patient Instructions (Signed)
Medication Instructions:  Your physician has recommended you make the following change in your medication:   STOP: Lasix   START: Torsemide 20 mg twice daily  *If you need a refill on your cardiac medications before your next appointment, please call your pharmacy*   Lab Work: Your physician recommends that you return for lab work in 1 week: bmp  If you have labs (blood work) drawn today and your tests are completely normal, you will receive your results only by: [place] (if you have MyChart) OR A paper copy in the mail If you have any lab test that is abnormal or we need to change your treatment, we will call you to review the results.   Testing/Procedures: None   Follow-Up: At [facility], you and your health needs are our priority.  As part of our continuing mission to provide you with exceptional heart care, we have created designated Provider Care Teams.  These Care Teams include your primary Cardiologist (physician) and Advanced Practice Providers (APPs -  Physician Assistants and Nurse Practitioners) who all work together to provide you with the care you need, when you need it.  We recommend signing up for the patient portal called "MyChart".  Sign up information is provided on this After Visit Summary.  MyChart is used to connect with patients for Virtual Visits (Telemedicine).  Patients are able to view lab/test results, encounter notes, upcoming appointments, etc.  Non-urgent messages can be sent to your provider as well.   To learn more about what you can do with MyChart, go to [email or web address].    Your next appointment:   5 month(s)  The format for your next appointment:   In Person  Provider:   Jenne Campus, MD    Other Instructions  Torsemide Oral Tablets What is this medication? TORSEMIDE (TORE se mide) is a diuretic. It helps you make more urine and lose salt and water from your body. It treats swelling from heart, kidney, or liver  disease. It also treats high blood pressure. This medicine may be used for other purposes; ask your health care provider or pharmacist if you have questions. COMMON BRAND NAME(S): Demadex, SOAANZ What should I tell my care team before I take this medication? They need to know if you have any of these conditions: high or low levels of electrolytes, like magnesium, potassium, and sodium, in your blood diabetes gout kidney disease liver disease an unusual or allergic reaction to torsemide, povidone, other medicines, foods, dyes, or preservatives pregnant or trying to get pregnant breast-feeding How should I use this medication? Take this drug by mouth with water. Take it as directed on the prescription label at the same time every day. Keep taking it unless your health care provider tells you to stop. Talk to your health care provider about the use of this drug in children. Special care may be needed. Overdosage: If you think you have taken too much of this medicine contact a poison control center or emergency room at once. NOTE: This medicine is only for you. Do not share this medicine with others. What if I miss a dose? If you miss a dose, take it as soon as you can. If it is almost time for your next dose, take only that dose. Do not take double or extra doses. What may interact with this medication? alcohol aspirin and aspirin-like medicines celecoxib certain medicines for blood pressure, heart disease, irregular heartbeat certain medicines for cholesterol like cholestyramine certain medicines for diabetes cisplatin  cyclosporine ephedra ginseng lithium medicines for infection like acyclovir, adefovir, amphotericin B, bacitracin, cidofovir, foscarnet, ganciclovir, gentamicin, pentamidine, vancomycin medicines that relax muscles for surgery NSAIDs, medicines for pain and inflammation, like ibuprofen or naproxen other diuretics pamidronate probenecid rifampin steroid medicines  like prednisone or cortisone warfarin zoledronic acid This list may not describe all possible interactions. Give your health care provider a list of all the medicines, herbs, non-prescription drugs, or dietary supplements you use. Also tell them if you smoke, drink alcohol, or use illegal drugs. Some items may interact with your medicine. What should I watch for while using this medication? Visit your health care provider for regular checks on your progress. Tell your health care provider if your symptoms do not start to get better or if they get worse. Check your blood pressure regularly. Ask your health care provider what your blood pressure should be. Also, find out when you should contact him or her. You may need blood work done while you are taking this drug. Do not treat yourself for coughs, colds, or pain while using this drug without asking your health care provider for advice. Some drugs may increase your blood pressure. This drug may increase blood sugar. Ask your health care provider if changes in diet or drugs are needed if you have diabetes. You may need to be on a special diet while you are taking this drug. Ask your health care provider. Also, find out how many glasses of fluids you need to drink each day. Check with your health care provider if you have severe diarrhea, nausea, and vomiting, or if you sweat a lot. The loss of too much body fluid may make it dangerous for you to take this drug. You may get drowsy or dizzy. Do not drive, use machinery, or do anything that needs mental alertness until you know how this drug affects you. Do not stand or sit up quickly, especially if you are an older patient. This reduces the risk of dizzy or fainting spells. Alcohol may interfere with the effects of this drug. Avoid alcoholic drinks. What side effects may I notice from receiving this medication? Side effects that you should report to your doctor or health care professional as soon as  possible: allergic reactions (skin rash, itching or hives; swelling of the face, lips, or tongue) decreased hearing, ringing in the ears electrolyte imbalance (increased thirst; loss of appetite; severe diarrhea; unusual sweating; vomiting) kidney injury (trouble passing urine or change in the amount of urine) low potassium (trouble breathing, chest pain; dizziness; fast, irregular heartbeat; feeling faints or lightheaded, falls; muscle cramps or pain) Side effects that usually do not require medical attention (report to your doctor or health care professional if they continue or are bothersome): passing large amounts of urine stomach pain This list may not describe all possible side effects. Call your doctor for medical advice about side effects. You may report side effects to FDA at 1-800-FDA-1088. Where should I keep my medication? Keep out of the reach of children and pets. Store at room temperature between 15 and 30 degrees C (59 and 86 degrees F). Do not freeze. Throw away any unused drug after the expiration date. NOTE: This sheet is a summary. It may not cover all possible information. If you have questions about this medicine, talk to your doctor, pharmacist, or health care provider.  2022 Elsevier/Gold Standard (2021-06-21 00:00:00)

## 2021-09-20 ENCOUNTER — Telehealth: Payer: Self-pay

## 2021-09-20 ENCOUNTER — Encounter: Payer: Self-pay | Admitting: Oncology

## 2021-09-20 NOTE — Telephone Encounter (Signed)
-----   Message from Derwood Kaplan, MD sent at 09/14/2021  4:12 PM EST ----- Regarding: call Tell her mammo is clear, she still needs a f/u appt.

## 2021-09-20 NOTE — Telephone Encounter (Signed)
Patient notified. Patient will call back to get follow up scheduled.

## 2021-09-30 ENCOUNTER — Telehealth: Payer: Self-pay | Admitting: Cardiology

## 2021-09-30 DIAGNOSIS — R0602 Shortness of breath: Secondary | ICD-10-CM

## 2021-09-30 DIAGNOSIS — I10 Essential (primary) hypertension: Secondary | ICD-10-CM

## 2021-09-30 DIAGNOSIS — I4891 Unspecified atrial fibrillation: Secondary | ICD-10-CM

## 2021-09-30 NOTE — Telephone Encounter (Signed)
Left message for patient to return call.

## 2021-09-30 NOTE — Telephone Encounter (Signed)
Pt c/o medication issue:  1. Name of Medication: diltiazem (CARDIZEM) 120 MG tablet torsemide (DEMADEX) 20 MG tablet  2. How are you currently taking this medication (dosage and times per day)?   3. Are you having a reaction (difficulty breathing--STAT)? no  4. What is your medication issue? Ditiazem- she states it's not working because she still has the fluid and not bringing her hear rate down, upset stomach, pretty much all the side effects she is getting them.  Mood swings, cold like symptoms, irregular heart beat. Torsemide- states it's not working as she is still swollen. Has not taken the fluid away.

## 2021-10-03 NOTE — Telephone Encounter (Signed)
Patient is returning call.  °

## 2021-10-03 NOTE — Telephone Encounter (Signed)
Pt is returning call. Pt can be reached at [phone]

## 2021-10-03 NOTE — Telephone Encounter (Signed)
Spoke to the patient just now and she let me know that her feet/legs are so swollen that she can hardly walk. She states that she is constantly going to the bathroom but her swelling is not going down.   She is also concerned about the diltiazem. She states that she has "All of the symptoms that she read about for this medication." Sore throat, upset stomach, irregular heart beat, swelling (She believes her swelling to be from the cardizem). She tells me her heart rate has been rapid. This morning it was 143 bpm while laying in bed.   She wants to discontinue her cardizem as she thinks she is worse while taking it compared to prior. I will route to Dr. Agustin Cree to get his recommendations from here.

## 2021-10-03 NOTE — Telephone Encounter (Signed)
Left message on patients voicemail to please return our call.   

## 2021-10-04 NOTE — Telephone Encounter (Signed)
Janet [name] is calling back in regards to this due to being upset she was not called back yesterday. She is requesting a callback as soon as possible on her work number [phone], due to her being there until 6:00 PM. Please advise.

## 2021-10-04 NOTE — Addendum Note (Signed)
Addended by: Resa Miner I on: 10/04/2021 03:55 PM   Modules accepted: Orders

## 2021-10-04 NOTE — Telephone Encounter (Signed)
Spoke to the patient just now and let her know Dr. Wendy Poet recommendations. She unfortunately is very unkind while I was speaking with her. She asks what office she can go to in order to complete the labs and I told her that she can go to the [facility] office to get these done if she would like to and it is closer for her compared to [place]. She yells at me "I am up here on [address] now". I explained to her that I was unsure where she meant. She states that this is in [place]. I gave her the [facility] office address and she said "Okay thanks" and hung up the phone.

## 2021-10-05 LAB — BASIC METABOLIC PANEL
BUN/Creatinine Ratio: 16 (ref 9–23)
BUN: 17 mg/dL (ref 6–24)
CO2: 23 mmol/L (ref 20–29)
Calcium: 9.1 mg/dL (ref 8.7–10.2)
Chloride: 103 mmol/L (ref 96–106)
Creatinine, Ser: 1.05 mg/dL — ABNORMAL HIGH (ref 0.57–1.00)
Glucose: 106 mg/dL — ABNORMAL HIGH (ref 70–99)
Potassium: 4.2 mmol/L (ref 3.5–5.2)
Sodium: 143 mmol/L (ref 134–144)
eGFR: 61 mL/min/{1.73_m2} (ref >59)

## 2021-10-05 LAB — PRO B NATRIURETIC PEPTIDE: NT-Pro BNP: 1114 pg/mL — ABNORMAL HIGH (ref 0–287)

## 2021-10-07 ENCOUNTER — Telehealth: Payer: Self-pay

## 2021-10-07 NOTE — Telephone Encounter (Signed)
Left message on patients voicemail to please return our call.   

## 2021-10-07 NOTE — Telephone Encounter (Signed)
-----   Message from Park Liter, MD sent at 10/06/2021  9:31 AM EST ----- Chem-7 looks good, continue present management

## 2021-10-11 ENCOUNTER — Telehealth: Payer: Self-pay | Admitting: Cardiology

## 2021-10-11 MED ORDER — METOPROLOL SUCCINATE ER 25 MG PO TB24
25.0000 mg | ORAL_TABLET | Freq: Every day | ORAL | 12 refills | Status: DC
Start: 2021-10-11 — End: 2021-10-14

## 2021-10-11 MED ORDER — METOLAZONE 2.5 MG PO TABS: 2.5000 mg | ORAL_TABLET | Freq: Every day | ORAL | 12 refills | Status: AC

## 2021-10-11 NOTE — Telephone Encounter (Signed)
Pt is needing test results please advise

## 2021-10-11 NOTE — Telephone Encounter (Signed)
Recommendations reviewed with pt as per Dr. Revankar's note.  Pt verbalized understanding and had no additional questions.   

## 2021-10-11 NOTE — Addendum Note (Signed)
Addended by: Truddie Hidden on: 10/11/2021 03:42 PM   Modules accepted: Orders

## 2021-10-11 NOTE — Telephone Encounter (Signed)
Results reviewed with pt as per Dr. Krasowski's note.  Pt verbalized understanding and had no additional questions. Routed to PCP  

## 2021-10-11 NOTE — Telephone Encounter (Signed)
Spoke with pt regarding her labs and due to her heart rate going up down 65-143. Pt states that her BP is usually 109-120 over 65-75. Pt states she has the same sx as someone whose potassium is low. I advised her that her potassium is normal. Pt would like to know what she needs to do now that her lab is normal and she is not taking anything for her "SVT". Please advise.

## 2021-10-12 ENCOUNTER — Telehealth: Payer: Self-pay | Admitting: Oncology

## 2021-10-12 NOTE — Telephone Encounter (Signed)
Per 11/30 Staff Msg, patient scheduled for 10/28/2021 Follow Up Appt.  Patient has been notified

## 2021-10-14 ENCOUNTER — Other Ambulatory Visit: Payer: Self-pay

## 2021-10-14 ENCOUNTER — Ambulatory Visit (INDEPENDENT_AMBULATORY_CARE_PROVIDER_SITE_OTHER): Payer: Managed Care, Other (non HMO) | Admitting: Cardiology

## 2021-10-14 ENCOUNTER — Encounter: Payer: Self-pay | Admitting: Cardiology

## 2021-10-14 VITALS — BP 142/86 | HR 116 | Resp 18 | Ht 65.0 in | Wt 365.0 lb

## 2021-10-14 DIAGNOSIS — I7121 Aneurysm of the ascending aorta, without rupture: Secondary | ICD-10-CM

## 2021-10-14 DIAGNOSIS — I480 Paroxysmal atrial fibrillation: Secondary | ICD-10-CM

## 2021-10-14 DIAGNOSIS — R0609 Other forms of dyspnea: Secondary | ICD-10-CM

## 2021-10-14 DIAGNOSIS — I4891 Unspecified atrial fibrillation: Secondary | ICD-10-CM

## 2021-10-14 DIAGNOSIS — R002 Palpitations: Secondary | ICD-10-CM

## 2021-10-14 MED ORDER — APIXABAN 5 MG PO TABS: 5.0000 mg | ORAL_TABLET | Freq: Two times a day (BID) | ORAL | 12 refills | Status: AC

## 2021-10-14 MED ORDER — METOPROLOL SUCCINATE ER 50 MG PO TB24: 25.0000 mg | ORAL_TABLET | Freq: Every day | ORAL | 3 refills | Status: DC

## 2021-10-14 NOTE — Patient Instructions (Addendum)
Medication Instructions:  Your physician has recommended you make the following change in your medication:   Stop aspirin.  Start Eliquis 5 mg twice daily.  Increase your Metoprolol succinate 50 mg daily.  *If you need a refill on your cardiac medications before your next appointment, please call your pharmacy*   Lab Work: None ordered If you have labs (blood work) drawn today and your tests are completely normal, you will receive your results only by: [place] (if you have MyChart) OR A paper copy in the mail If you have any lab test that is abnormal or we need to change your treatment, we will call you to review the results.   Testing/Procedures: Your physician has requested that you have a lexiscan myoview. For further information please visit [email or web address]. Please follow instruction sheet, as given.  The test will done over 2 days and take approximately 3 to 4 hours each day to complete; you may bring reading material.  If someone comes with you to your appointment, they will need to remain in the main lobby due to limited space in the testing area. **If you are pregnant or breastfeeding, please notify the nuclear lab prior to your appointment**  How to prepare for your Myocardial Perfusion Test: Do not eat or drink 3 hours prior to your test, except you may have water. Do not consume products containing caffeine (regular or decaffeinated) 12 hours prior to your test. (ex: coffee, chocolate, sodas, tea). Do bring a list of your current medications with you.  If not listed below, you may take your medications as normal. Do wear comfortable clothes (no dresses or overalls) and walking shoes, tennis shoes preferred (No heels or open toe shoes are allowed). Do NOT wear cologne, perfume, aftershave, or lotions (deodorant is allowed). If these instructions are not followed, your test will have to be rescheduled.    Follow-Up: At [facility], you and your health needs  are our priority.  As part of our continuing mission to provide you with exceptional heart care, we have created designated Provider Care Teams.  These Care Teams include your primary Cardiologist (physician) and Advanced Practice Providers (APPs -  Physician Assistants and Nurse Practitioners) who all work together to provide you with the care you need, when you need it.  We recommend signing up for the patient portal called "MyChart".  Sign up information is provided on this After Visit Summary.  MyChart is used to connect with patients for Virtual Visits (Telemedicine).  Patients are able to view lab/test results, encounter notes, upcoming appointments, etc.  Non-urgent messages can be sent to your provider as well.   To learn more about what you can do with MyChart, go to [email or web address].    Your next appointment:   2-3 week(s)  The format for your next appointment:   In Person  Provider:   Jenne Campus, MD   Other Instructions Cardiac Nuclear Scan A cardiac nuclear scan is a test that is done to check the flow of blood to your heart. It is done when you are resting and when you are exercising. The test looks for problems such as: Not enough blood reaching a portion of the heart. The heart muscle not working as it should. You may need this test if: You have heart disease. You have had lab results that are not normal. You have had heart surgery or a balloon procedure to open up blocked arteries (angioplasty). You have chest pain. You have shortness of breath. In  this test, a special dye (tracer) is put into your bloodstream. The tracer will travel to your heart. A camera will then take pictures of your heart to see how the tracer moves through your heart. This test is usually done at a hospital and takes 2-4 hours. Tell a doctor about: Any allergies you have. All medicines you are taking, including vitamins, herbs, eye drops, creams, and over-the-counter medicines. Any  problems you or family members have had with anesthetic medicines. Any blood disorders you have. Any surgeries you have had. Any medical conditions you have. Whether you are pregnant or may be pregnant. What are the risks? Generally, this is a safe test. However, problems may occur, such as: Serious chest pain and heart attack. This is only a risk if the stress portion of the test is done. Rapid heartbeat. A feeling of warmth in your chest. This feeling usually does not last long. Allergic reaction to the tracer. What happens before the test? Ask your doctor about changing or stopping your normal medicines. This is important. Follow instructions from your doctor about what you cannot eat or drink. Remove your jewelry on the day of the test. What happens during the test? An IV tube will be inserted into one of your veins. Your doctor will give you a small amount of tracer through the IV tube. You will wait for 20-40 minutes while the tracer moves through your bloodstream. Your heart will be monitored with an electrocardiogram (ECG). You will lie down on an exam table. Pictures of your heart will be taken for about 15-20 minutes. You may also have a stress test. For this test, one of these things may be done: You will be asked to exercise on a treadmill or a stationary bike. You will be given medicines that will make your heart work harder. This is done if you are unable to exercise. When blood flow to your heart has peaked, a tracer will again be given through the IV tube. After 20-40 minutes, you will get back on the exam table. More pictures will be taken of your heart. Depending on the tracer that is used, more pictures may need to be taken 3-4 hours later. Your IV tube will be removed when the test is over. The test may vary among doctors and hospitals. What happens after the test? Ask your doctor: Whether you can return to your normal schedule, including diet, activities, and  medicines. Whether you should drink more fluids. This will help to remove the tracer from your body. Drink enough fluid to keep your pee (urine) pale yellow. Ask your doctor, or the department that is doing the test: When will my results be ready? How will I get my results? Summary A cardiac nuclear scan is a test that is done to check the flow of blood to your heart. Tell your doctor whether you are pregnant or may be pregnant. Before the test, ask your doctor about changing or stopping your normal medicines. This is important. Ask your doctor whether you can return to your normal activities. You may be asked to drink more fluids. This information is not intended to replace advice given to you by your health care provider. Make sure you discuss any questions you have with your health care provider. Document Revised: 01/22/2019 Document Reviewed: 03/18/2018 Elsevier Patient Education  [place].

## 2021-10-14 NOTE — Progress Notes (Signed)
Cardiology Office Note:    Date:  10/14/2021   ID:  Janet [name], DOB [birth year], MRN [id number]  PCP:  Inda Coke, PA  Cardiologist:  Jenean Lindau, MD   Referring MD: Inda Coke, PA    ASSESSMENT:    1. Aneurysm of ascending aorta without rupture   2. Paroxysmal atrial fibrillation (HCC)   3. Morbid obesity (HCC)   4. Palpitations   5. DOE (dyspnea on exertion)    PLAN:    In order of problems listed above:  Primary prevention stressed with the patient.  Importance of compliance with diet medication stressed and she vocalized understanding. Newly diagnosed atrial fibrillation:I discussed with the patient atrial fibrillation, disease process. Management and therapy including rate and rhythm control, anticoagulation benefits and potential risks were discussed extensively with the patient. Patient had multiple questions which were answered to patient's satisfaction.  I will initiate her on anticoagulation with Eliquis 5 mg twice daily.  She will have a I fob test for occult blood done in about a week's time.  She has had no history of bleeding in the past.  I discussed this with her at length and questions were answered to her satisfaction.  Her CHA2DS2-VASc score is 2.  Also in the future she may be a candidate for cardioversion.  Or medication such as flecainide therapy.  In view of this I will do a Lexiscan sestamibi so Dr. Agustin Cree might be able to better navigate her management from this perspective.  She may also be a candidate for cardioversion when he sees her next in 2 to 3 weeks. I increased her beta-blocker to metoprolol 50 mg daily.  She will keep a track of her blood pressure and pulse and get back to [place] and we will titrate medications accordingly. Morbid obesity: Diet was emphasized and risks of obesity explained and she promises to do better. Patient had multiple questions which were answered to her satisfaction. Essential hypertension: Blood pressure  is mildly elevated hopefully when she comes down it will be better and also increasing beta-blocker will help heart rate and blood pressure.  Her being upset about the sole diagnosis is not helping her will monitor heart rate and blood pressure as she keeps sending her blood pressure log.   Medication Adjustments/Labs and Tests Ordered: Current medicines are reviewed at length with the patient today.  Concerns regarding medicines are outlined above.  No orders of the defined types were placed in this encounter.  No orders of the defined types were placed in this encounter.    Chief Complaint  Patient presents with   Follow-up    Pt came in for a BP check and was found to have a irregular heart rate.     History of Present Illness:    Janet [name] is a 60 y.o. female palpitations.  She called her office and we brought her in for an EKG.  EKG has revealed fibrillation with elevated ventricular rate.  Based on record review by Dr. Wendy Poet evaluation there is no recent history atrial fibrillation.  She denies any chest pain orthopnea or PND.  She complains of shortness of breath on exertion.  She is a little upset because of this finding.  I also noted echocardiogram has revealed significantly elevated ascending aortic size.  At the time of my evaluation, the patient is alert awake oriented and in no distress.  Past Medical History:  Diagnosis Date   Anemia    history of anemia from  AUB   Anxiety    Breast cancer ([place]) 2012   left breast/  NO CHEMO   Complication of anesthesia    difficult IV access- never needed PICC line   Depression    no meds   Edema of both legs    Hypertension    ekg 1/13 on chart   Pneumonia 1989   hx   PONV (postoperative nausea and vomiting)    Umbilical hernia    currently - not repaired yet    Past Surgical History:  Procedure Laterality Date   ABDOMINAL HYSTERECTOMY  04/30/2012   Procedure: HYSTERECTOMY ABDOMINAL;  Surgeon: Imagene Gurney A.  Alycia Rossetti, MD;  Location: [facility];  Service: Gynecology;  Laterality: N/A;   BREAST CAPSULECTOMY WITH IMPLANT EXCHANGE  10/02/2012   Procedure: BREAST CAPSULECTOMY WITH IMPLANT EXCHANGE;  Surgeon: Theodoro Kos, DO;  Location: [facility];  Service: Plastics;  Laterality: Left;   LEFT EXPANDER REMOVAL WITH CAPSULECTOMY AND PLACEMENT OF IMPLANT   BREAST IMPLANT EXCHANGE Left 01/20/2013   Procedure: REVISION OF LEFT BREAST  IMPLANT ;  Surgeon: Theodoro Kos, DO;  Location: [facility];  Service: Plastics;  Laterality: Left;   BREAST REDUCTION SURGERY N/A 01/20/2013   Procedure: Abdominal fat graft to breast reconstruction area;  Surgeon: Theodoro Kos, DO;  Location: [facility];  Service: Plastics;  Laterality: N/A;   BREAST SURGERY  2012   lt mastectomy/ axillary dissection   BREAST SURGERY  2012   tissue expander lt   COLONOSCOPY     DILATION AND CURETTAGE OF UTERUS  01/26/2012   Procedure: DILATATION AND CURETTAGE;  Surgeon: Lahoma Crocker, MD;  Location: [facility];  Service: Gynecology;;   ESOPHAGOGASTRODUODENOSCOPY     HERNIA REPAIR     LAPAROTOMY  04/30/2012   Procedure: EXPLORATORY LAPAROTOMY;  Surgeon: Lucita Lora. Alycia Rossetti, MD;  Location: [facility];  Service: Gynecology;  Laterality: N/A;   MASTECTOMY     Left side   MASTOPEXY  10/02/2012   Procedure: MASTOPEXY;  Surgeon: Theodoro Kos, DO;  Location: [facility];  Service: Plastics;  Laterality: Right;  RIGHT BREAST MASTOPEXY REDUCTION FOR SYMMETRY,   MULTIPLE EXTRACTIONS WITH ALVEOLOPLASTY  10/23/2011   MULTIPLE TOOTH EXTRACTIONS     NOVASURE ABLATION  03/01/2012   RECONSTRUCTION BREAST IMMEDIATE / DELAYED W/ TISSUE EXPANDER     tissue expander lt-9/12   SALPINGOOPHORECTOMY  04/30/2012   Procedure: SALPINGO OOPHERECTOMY;  Surgeon: Imagene Gurney A. Alycia Rossetti, MD;  Location: [facility];  Service: Gynecology;  Laterality: Bilateral;   TOE OSTEOTOMY     rt toe   VENTRAL HERNIA REPAIR  04/30/2012   Procedure: HERNIA REPAIR VENTRAL ADULT;  Surgeon:  Imagene Gurney A. Alycia Rossetti, MD;  Location: [facility];  Service: Gynecology;  Laterality: N/A;    Current Medications: Current Meds  Medication Sig   Ascorbic Acid (VITAMIN C) 1000 MG tablet Take 1,000 mg by mouth 2 (two) times daily.   aspirin 325 MG tablet Take 325 mg by mouth daily.   Calcium Carb-Cholecalciferol (CALCIUM 500/D PO) Take 600 mg by mouth daily.   FLUoxetine (PROZAC) 10 MG tablet Take 10 mg by mouth daily.   Inulin (FIBER CHOICE PO) Take 1 tablet by mouth daily. [place]   lisinopril (ZESTRIL) 20 MG tablet TAKE ONE (1) TABLET BY MOUTH EVERY DAY   metolazone (ZAROXOLYN) 2.5 MG tablet Take 1 tablet (2.5 mg total) by mouth daily.   metoprolol succinate (TOPROL XL) 25 MG 24 hr tablet Take 1 tablet (25 mg total) by mouth  daily.   Misc Natural Products (TART CHERRY ADVANCED) CAPS Take 1,200 capsules by mouth daily.   Multiple Vitamin (MULTIVITAMIN WITH MINERALS) TABS Take 1 tablet by mouth daily.   Potassium 99 MG TABS Take 99 mg by mouth daily.   Turmeric 500 MG CAPS Take 1 capsule by mouth daily.   vitamin B-12 (CYANOCOBALAMIN) 100 MCG tablet Take 50 mcg by mouth 2 (two) times daily.   Vitamin D, Ergocalciferol, (DRISDOL) 1.25 MG (50000 UNIT) CAPS capsule Take 10,000 Units by mouth every 7 (seven) days.     Allergies:   Petrolatum-zinc oxide, Amoxicillin, Clarithromycin, Ivp dye [iodinated contrast media], and Tape   Social History   Socioeconomic History   Marital status: Single    Spouse name: Not on file   Number of children: Not on file   Years of education: Not on file   Highest education level: Not on file  Occupational History   Not on file  Tobacco Use   Smoking status: Never   Smokeless tobacco: Never  Substance and Sexual Activity   Alcohol use: No   Drug use: No   Sexual activity: Never    Birth control/protection: None  Other Topics Concern   Not on file  Social History Narrative   Files tax returns; 2 years   Trained as bookkeeper and Press photographer   Social  Determinants of Health   Financial Resource Strain: Not on file  Food Insecurity: Not on file  Transportation Needs: Not on file  Physical Activity: Not on file  Stress: Not on file  Social Connections: Not on file     Family History: The patient's family history includes Atrial fibrillation in her maternal grandmother and mother; COPD in her mother; Depression in her mother; Heart attack in her brother and mother; Prostate cancer in her father.  ROS:   Please see the history of present illness.    All other systems reviewed and are negative.  EKGs/Labs/Other Studies Reviewed:    The following studies were reviewed today: EKG reveals atrial fibrillation with elevated ventricular rate.   Recent Labs: 10/04/2021: BUN 17; Creatinine, Ser 1.05; NT-Pro BNP 1,114; Potassium 4.2; Sodium 143  Recent Lipid Panel    Component Value Date/Time   CHOL 225 (H) 05/12/2019 0949   TRIG 166.0 (H) 05/12/2019 0949   HDL 46.20 05/12/2019 0949   CHOLHDL 5 05/12/2019 0949   VLDL 33.2 05/12/2019 0949   LDLCALC 145 (H) 05/12/2019 0949    Physical Exam:    VS:  BP (!) 142/86 (BP Location: Right Arm, Patient Position: Sitting)    Pulse (!) 116    Resp 18    Ht 5\' 5"  (1.651 m)    Wt (!) 365 lb (165.6 kg)    LMP 03/15/2012    SpO2 96%    BMI 60.74 kg/m     Wt Readings from Last 3 Encounters:  10/14/21 (!) 365 lb (165.6 kg)  09/14/21 (!) 371 lb (168.3 kg)  06/14/21 (!) 372 lb 6.4 oz (168.9 kg)     GEN: Patient is in no acute distress HEENT: Normal NECK: No JVD; No carotid bruits LYMPHATICS: No lymphadenopathy CARDIAC: Hear sounds regular, 2/6 systolic murmur at the apex. RESPIRATORY:  Clear to auscultation without rales, wheezing or rhonchi  ABDOMEN: Soft, non-tender, non-distended MUSCULOSKELETAL:  No edema; No deformity  SKIN: Warm and dry NEUROLOGIC:  Alert and oriented x 3 PSYCHIATRIC:  Normal affect   Signed, Jenean Lindau, MD  10/14/2021 1:19 PM  [facility]

## 2021-10-25 ENCOUNTER — Telehealth: Payer: Self-pay

## 2021-10-25 NOTE — Progress Notes (Signed)
[facility]  [address] [place],  [place]  [address] [phone]  Clinic Day:  10/28/2021  Referring physician: Inda Coke, PA  This document serves as a record of services personally performed by Hosie Poisson, MD. It was created on their behalf by Curry,Lauren E, a trained medical scribe. The creation of this record is based on the scribe's personal observations and the provider's statements to them.  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   Hernia, for which she is being scheduled for surgery in the near future.  In regards to her breast cancer, she continues to do well, we will see her back in December with unilateral right mammogram.  She understands and agrees with this plan of care.   I provided 15 minutes of face-to-face time during this this encounter and > 50% was spent counseling as documented under my assessment and plan.    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  Pathology revealed a 6 cm grade 1-2 ductal carcinoma in situ with 3 negative sentinel nodes for a Tis N0 M0.  Estrogen and progesterone receptors were positive and her 2 positive with a Ki 67 of 10%.  I did not recommend any further therapy but we did discuss chemo prevention and she declined.  She later had left breast reconstruction and later hysterectomy as well.  In fact she has had many surgeries.   She denies any chest pain or shortness of breath but does occasionally get brief periods of burning sensation in her right breast area.  She did have a vitamin-D deficiency, and her level dropped from 36 to 34.2 this year.  I reviewed her family history and her father had prostate cancer with spread to bones and lung.  A maternal great aunt had breast cancer which later recurred.  Two maternal cousins had abnormalities but not cancer.  We know she had the DCIS but she also thinks she may have had cancer of the uterus or ovaries when she had her hysterectomy in 2013.  I tracked down her pathology and this was benign.  She therefore does not fit the criteria for genetic testing, but I told her she might if another relative developed breast cancer.    INTERVAL HISTORY:  I have reviewed her chart and materials related to her cancer extensively and collaborated history with the patient. Summary of oncologic history is as follows: Oncology History  Malignant neoplasm of female breast ([facility])  09/26/2010 Cancer Staging   Staging form: Breast, AJCC 8th Edition - Clinical stage from 09/26/2010: Stage 0 (cTis (DCIS), cN0(sn), cM0, G2, ER+, PR+, HER2+) - Signed by Derwood Kaplan, MD on 07/07/2021 Histopathologic type: Intraductal carcinoma, noninfiltrating, NOS Stage prefix: Initial diagnosis Method of lymph node assessment: Sentinel lymph node biopsy Nuclear grade: G2 Multigene prognostic tests performed: None Histologic grading system: 3 grade system Laterality: Left Tumor size (mm): 60  Lymph-vascular invasion (LVI): LVI not present (absent)/not identified Diagnostic confirmation: Positive histology Specimen type: Excision Staged by: Managing physician Menopausal status: Premenopausal Ki-67 (%): 10 Stage used in treatment planning: Yes National guidelines used in treatment planning: Yes Type of national guideline used in treatment planning: NCCN    05/12/2019 Initial Diagnosis   Malignant  neoplasm of female breast [facility])     C[place]ne is here for annual follow up and states that she is doing somewhat poorly. She states that she is being scheduled for hernia repair in the near future. She has been cleared by Dr. Agustin Cree for the procedure. She rates her pain as a 3/10 today. Annual unilateral right mammogram from November 2022 was clear. She continues routine lab work through her primary care physician. Her  appetite is good, and she has lost 10 pounds since her last visit  She denies fever, chills or other signs of infection.  She denies nausea, vomiting, bowel issues, or abdominal pain.  She denies sore throat, cough, dyspnea, or chest pain.  HISTORY:   Allergies:  Allergies  Allergen Reactions   Petrolatum-Zinc Oxide Rash    Allergic to surgical tape - no reaction listed.   Amoxicillin Other (See Comments), Nausea Only and Rash    Decreased heart rate Other reaction(s): Other (See Comments), Unknown "drops heart rate in to the 40s" Decreased heart rate "drops heart rate in to the 40s" Decreased heart rate   Clarithromycin Other (See Comments)    Breaks mouth out   Ivp Dye [Iodinated Contrast Media] Other (See Comments)    States made bp systolic has increased and diastolic has decreased and hasnt felt well x 3 weeks since dye adm   Tape Dermatitis    Only can have paper tape    Current Medications: Current Outpatient Medications  Medication Sig Dispense Refill   apixaban (ELIQUIS) 5 MG TABS tablet Take 1 tablet (5 mg total) by mouth 2 (two) times daily. 60 tablet 12   Ascorbic Acid (VITAMIN C) 1000 MG tablet Take 1,000 mg by mouth 2 (two) times daily.     Calcium Carb-Cholecalciferol (CALCIUM 500/D PO) Take 600 mg by mouth daily.     FLUoxetine (PROZAC) 10 MG tablet Take 10 mg by mouth daily.     Inulin (FIBER CHOICE PO) Take 1 tablet by mouth daily. [place]     lisinopril (ZESTRIL) 20 MG tablet TAKE ONE (1) TABLET BY MOUTH EVERY DAY 90 tablet 1   metolazone (ZAROXOLYN)  2.5 MG tablet Take 1 tablet (2.5 mg total) by mouth daily. 30 tablet 12   metoprolol succinate (TOPROL XL) 50 MG 24 hr tablet Take 0.5 tablets (25 mg total) by mouth daily. 90 tablet 3   Misc Natural Products (TART CHERRY ADVANCED) CAPS Take 1,200 capsules by mouth daily.     Multiple Vitamin (MULTIVITAMIN WITH MINERALS) TABS Take 1 tablet by mouth daily.     Potassium 99 MG TABS Take 99 mg by mouth daily.     Turmeric 500 MG CAPS Take 1 capsule by mouth daily.     vitamin B-12 (CYANOCOBALAMIN) 100 MCG tablet Take 50 mcg by mouth 2 (two) times daily.     Vitamin D, Ergocalciferol, (DRISDOL) 1.25 MG (50000 UNIT) CAPS capsule Take 10,000 Units by mouth every 7 (seven) days.     No current facility-administered medications for this visit.    REVIEW OF SYSTEMS:  Review of Systems  Constitutional: Negative.  Negative for appetite change, chills, fatigue, fever and unexpected weight  change.  HENT:  Negative.    Eyes: Negative.   Respiratory: Negative.  Negative for chest tightness, cough, hemoptysis, shortness of breath and wheezing.   Cardiovascular: Negative.  Negative for chest pain, leg swelling and palpitations.  Gastrointestinal:  Positive for abdominal pain (secondary to hernia). Negative for abdominal distention, blood in stool, constipation, diarrhea, nausea and vomiting.  Endocrine: Negative.   Genitourinary: Negative.  Negative for difficulty urinating, dysuria, frequency and hematuria.   Musculoskeletal: Negative.  Negative for arthralgias, back pain, flank pain, gait problem and myalgias.  Skin: Negative.   Neurological: Negative.  Negative for dizziness, extremity weakness, gait problem, headaches, light-headedness, numbness, seizures and speech difficulty.  Hematological: Negative.   Psychiatric/Behavioral: Negative.  Negative for depression and sleep disturbance. The patient is not nervous/anxious.      VITALS:  Blood pressure (!) 153/83, pulse 85, temperature 98 F (36.7 C),  temperature source Oral, resp. rate 18, height 5' 5"  (1.651 m), weight (!) 364 lb 1.6 oz (165.2 kg), last menstrual period 03/15/2012, SpO2 96 %.  Wt Readings from Last 3 Encounters:  10/28/21 (!) 364 lb 1.6 oz (165.2 kg)  10/14/21 (!) 365 lb (165.6 kg)  09/14/21 (!) 371 lb (168.3 kg)    Body mass index is 60.59 kg/m.  Performance status (ECOG): 1 - Symptomatic but completely ambulatory  PHYSICAL EXAM:  Physical Exam Constitutional:      General: She is not in acute distress.    Appearance: Normal appearance. She is normal weight.  HENT:     Head: Normocephalic and atraumatic.  Eyes:     General: No scleral icterus.    Extraocular Movements: Extraocular movements intact.     Conjunctiva/sclera: Conjunctivae normal.     Pupils: Pupils are equal, round, and reactive to light.  Cardiovascular:     Rate and Rhythm: Normal rate and regular rhythm.     Pulses: Normal pulses.     Heart sounds: Normal heart sounds. No murmur heard.   No friction rub. No gallop.  Pulmonary:     Effort: Pulmonary effort is normal. No respiratory distress.     Breath sounds: Normal breath sounds.  Chest:  Breasts:    Right: Normal.     Left: Normal.     Comments: Right breast without masses. Left reconstruction is negative, but there is scar tissue in the lower left axilla. Abdominal:     General: Bowel sounds are normal. There is no distension.     Palpations: Abdomen is soft. There is no hepatomegaly, splenomegaly or mass.     Tenderness: There is no abdominal tenderness.  Musculoskeletal:        General: Normal range of motion.     Cervical back: Normal range of motion and neck supple.     Right lower leg: No edema.     Left lower leg: No edema.  Lymphadenopathy:     Cervical: No cervical adenopathy.  Skin:    General: Skin is warm and dry.  Neurological:     General: No focal deficit present.     Mental Status: She is alert and oriented to person, place, and time. Mental status is at  baseline.  Psychiatric:        Mood and Affect: Mood normal.        Behavior: Behavior normal.        Thought Content: Thought content normal.        Judgment: Judgment normal.     LABS:   CBC Latest Ref  Rng & Units 05/12/2019 01/20/2013 10/02/2012  WBC 4.0 - 10.5 K/uL 8.4 8.1 -  Hemoglobin 12.0 - 15.0 g/dL 13.2 14.1 15.0  Hematocrit 36.0 - 46.0 % 38.6 39.6 44.0  Platelets 150.0 - 400.0 K/uL 252.0 252 -   CMP Latest Ref Rng & Units 10/04/2021 10/27/2019 05/12/2019  Glucose 70 - 99 mg/dL 106(H) 98 115(H)  BUN 6 - 24 mg/dL 17 16 21   Creatinine 0.57 - 1.00 mg/dL 1.05(H) 0.93 1.07  Sodium 134 - 144 mmol/L 143 137 139  Potassium 3.5 - 5.2 mmol/L 4.2 4.1 3.8  Chloride 96 - 106 mmol/L 103 100 100  CO2 20 - 29 mmol/L 23 26 31   Calcium 8.7 - 10.2 mg/dL 9.1 9.5 9.3  Total Protein 6.0 - 8.3 g/dL - - 7.2  Total Bilirubin 0.2 - 1.2 mg/dL - - 0.4  Alkaline Phos 39 - 117 U/L - - 77  AST 0 - 37 U/L - - 15  ALT 0 - 35 U/L - - 13    STUDIES:  No results found.    EXAM: 09/14/2021 DIGITAL SCREENING UNILATERAL RIGHT MAMMOGRAM WITH CAD AND TOMOSYNTHESIS   TECHNIQUE:  Right screening digital craniocaudal and mediolateral oblique  mammograms were obtained. Right screening digital breast  tomosynthesis was performed. The images were evaluated with  computer-aided detection.   COMPARISON: Previous exam(s).   ACR Breast Density Category b: There are scattered areas of  fibroglandular density.   FINDINGS:  The patient has had a left mastectomy. There are no findings  suspicious for malignancy.   IMPRESSION:  No mammographic evidence of malignancy.      I, Rita Ohara, am acting as scribe for Derwood Kaplan, MD  I have reviewed this report as typed by the medical scribe, and it is complete and accurate.

## 2021-10-25 NOTE — Telephone Encounter (Signed)
° °  Pre-operative Risk Assessment    Patient Name: Janet [name]  DOB: Nov 14, 1960 MRN: [id number]      Request for Surgical Clearance    Procedure:   Incisional hernia  Date of Surgery:  Clearance TBD                                 Surgeon:  Dr. Louanna Raw Surgeon's Group or Practice Name:  [facility] Phone number:  [phone] Fax number:  [phone] Attention Charmella Little   Type of Clearance Requested:   - Pharmacy:  Hold Apixaban (Eliquis)     Type of Anesthesia:  General    Additional requests/questions:    Gretchen Short   10/25/2021, 3:54 PM

## 2021-10-26 NOTE — Telephone Encounter (Signed)
Patient with diagnosis of atrial fibrillation on Eliquis for anticoagulation.    Procedure: incisional hernia  Date of procedure: TBD   CHA2DS2-VASc Score = 2   This indicates a 2.2% annual risk of stroke. The patient's score is based upon: CHF History: 0 HTN History: 1 Diabetes History: 0 Stroke History: 0 Vascular Disease History: 0 Age Score: 0 Gender Score: 1     CrCl 91 Platelet count 251  Per office protocol, patient can hold Eliquis for 2 days prior to procedure.   Patient will not need bridging with Lovenox (enoxaparin) around procedure.

## 2021-10-27 NOTE — Telephone Encounter (Signed)
° °  Primary Cardiologist: Jenean Lindau, MD  Chart reviewed by the clinical pharmacist  for patient's past medical history, medications, and request from surgeon as part of pre-operative protocol coverage.Janet [name] may hold her Eliquis per guidelines below:  Patient with diagnosis of atrial fibrillation on Eliquis for anticoagulation.     Procedure: incisional hernia  Date of procedure: TBD     CHA2DS2-VASc Score = 2   This indicates a 2.2% annual risk of stroke. The patient's score is based upon: CHF History: 0 HTN History: 1 Diabetes History: 0 Stroke History: 0 Vascular Disease History: 0 Age Score: 0 Gender Score: 1   CrCl 91 Platelet count 251   Per office protocol, patient can hold Eliquis for 2 days prior to procedure.   Patient will not need bridging with Lovenox (enoxaparin) around procedure.  I will route this recommendation to the requesting party via Epic fax function and remove from pre-op pool.  Please call with questions.  Emmaline Life, NP-C    10/27/2021, 11:10 AM [place] [address] Office [phone] Fax [phone]

## 2021-10-28 ENCOUNTER — Other Ambulatory Visit: Payer: Self-pay

## 2021-10-28 ENCOUNTER — Other Ambulatory Visit: Payer: Self-pay | Admitting: Oncology

## 2021-10-28 ENCOUNTER — Encounter: Payer: Self-pay | Admitting: Oncology

## 2021-10-28 ENCOUNTER — Inpatient Hospital Stay: Payer: Managed Care, Other (non HMO) | Attending: Oncology | Admitting: Oncology

## 2021-10-28 VITALS — BP 153/83 | HR 85 | Temp 98.0°F | Resp 18 | Ht 65.0 in | Wt 364.1 lb

## 2021-10-28 DIAGNOSIS — C50912 Malignant neoplasm of unspecified site of left female breast: Secondary | ICD-10-CM

## 2021-10-28 DIAGNOSIS — Z170 Estrogen receptor positive status [ER+]: Secondary | ICD-10-CM | POA: Diagnosis not present

## 2021-11-02 ENCOUNTER — Telehealth: Payer: Self-pay | Admitting: Oncology

## 2021-11-02 ENCOUNTER — Telehealth (HOSPITAL_COMMUNITY): Payer: Self-pay | Admitting: *Deleted

## 2021-11-02 NOTE — Telephone Encounter (Signed)
Scheduled appt per 1/13 los- called and and lvm with appt date and time

## 2021-11-02 NOTE — Telephone Encounter (Signed)
Left message on voicemail per DPR in reference to upcoming appointment scheduled on 11/09/21 at 8:15 with detailed instructions given per Myocardial Perfusion Study Information Sheet for the test. LM to arrive 15 minutes early, and that it is imperative to arrive on time for appointment to keep from having the test rescheduled. If you need to cancel or reschedule your appointment, please call the office within 24 hours of your appointment. Failure to do so may result in a cancellation of your appointment, and a $50 no show fee. Phone number given for call back for any questions.

## 2021-11-09 ENCOUNTER — Ambulatory Visit (INDEPENDENT_AMBULATORY_CARE_PROVIDER_SITE_OTHER): Payer: Managed Care, Other (non HMO)

## 2021-11-09 ENCOUNTER — Other Ambulatory Visit: Payer: Self-pay

## 2021-11-09 DIAGNOSIS — I480 Paroxysmal atrial fibrillation: Secondary | ICD-10-CM

## 2021-11-09 MED ORDER — REGADENOSON 0.4 MG/5ML IV SOLN
0.4000 mg | Freq: Once | INTRAVENOUS | Status: AC
  Administered 2021-11-09: 0.4 mg via INTRAVENOUS

## 2021-11-09 MED ORDER — TECHNETIUM TC 99M TETROFOSMIN IV KIT
31.2000 | PACK | Freq: Once | INTRAVENOUS | Status: AC | PRN
  Administered 2021-11-09: 31.2 via INTRAVENOUS

## 2021-11-10 ENCOUNTER — Telehealth: Payer: Self-pay | Admitting: Cardiology

## 2021-11-10 ENCOUNTER — Ambulatory Visit: Payer: Managed Care, Other (non HMO)

## 2021-11-10 ENCOUNTER — Ambulatory Visit (INDEPENDENT_AMBULATORY_CARE_PROVIDER_SITE_OTHER): Payer: Managed Care, Other (non HMO) | Admitting: Cardiology

## 2021-11-10 VITALS — BP 136/78 | HR 95 | Ht 65.5 in | Wt 364.6 lb

## 2021-11-10 DIAGNOSIS — I10 Essential (primary) hypertension: Secondary | ICD-10-CM

## 2021-11-10 DIAGNOSIS — I480 Paroxysmal atrial fibrillation: Secondary | ICD-10-CM

## 2021-11-10 DIAGNOSIS — R0609 Other forms of dyspnea: Secondary | ICD-10-CM | POA: Diagnosis not present

## 2021-11-10 LAB — MYOCARDIAL PERFUSION IMAGING
LV dias vol: 95 mL (ref 46–106)
LV sys vol: 51 mL
Nuc Stress EF: 46 %
Peak HR: 139 {beats}/min
Rest HR: 112 {beats}/min
Rest Nuclear Isotope Dose: 30.5 mCi
SDS: 1
SRS: 6
SSS: 7
Stress Nuclear Isotope Dose: 31.2 mCi
TID: 0.84

## 2021-11-10 LAB — FECAL OCCULT BLOOD, IMMUNOCHEMICAL: Fecal Occult Bld: POSITIVE — AB

## 2021-11-10 MED ORDER — TECHNETIUM TC 99M TETROFOSMIN IV KIT
30.5000 | PACK | Freq: Once | INTRAVENOUS | Status: AC | PRN
  Administered 2021-11-10: 30.5 via INTRAVENOUS

## 2021-11-10 MED ORDER — METOPROLOL SUCCINATE ER 50 MG PO TB24: 75.0000 mg | ORAL_TABLET | Freq: Every day | ORAL | 1 refills | Status: DC

## 2021-11-10 NOTE — Patient Instructions (Signed)
Medication Instructions:  Your physician has recommended you make the following change in your medication:   Start: Metoprolol succinate 75 mg daily  *If you need a refill on your cardiac medications before your next appointment, please call your pharmacy*   Lab Work: None If you have labs (blood work) drawn today and your tests are completely normal, you will receive your results only by: [place] (if you have MyChart) OR A paper copy in the mail If you have any lab test that is abnormal or we need to change your treatment, we will call you to review the results.   Testing/Procedures: Medication Instructions:  Your physician recommends that you continue on your current medications as directed. Please refer to the Current Medication list given to you today.  *If you need a refill on your cardiac medications before your next appointment, please call your pharmacy*   Lab Work: Your physician recommends that you have a BMET and CBC today in the office.  If you have labs (blood work) drawn today and your tests are completely normal, you will receive your results only by: [place] (if you have MyChart) OR A paper copy in the mail If you have any lab test that is abnormal or we need to change your treatment, we will call you to review the results.   Testing/Procedures: Your physician has recommended that you have a Cardioversion (DCCV). Electrical Cardioversion uses a jolt of electricity to your heart either through paddles or wired patches attached to your chest. This is a controlled, usually prescheduled, procedure. Defibrillation is done under light anesthesia in the hospital, and you usually go home the day of the procedure. This is done to get your heart back into a normal rhythm. You are not awake for the procedure.   Diagnosis: A-FIB  DATE AND TIME OF PROCEDURE: February 17TH AT 1 PM  Please register at the Admitting Department at: [place]  ANYTHING after midnight prior to your procedure.  You should take your medications as usual with a sip of water.  If you are diabetic, DO NOT TAKE YOUR DIABETIC MEDICATIONS the morning OF THE PROCEDURE.  If you are taking Lanoxin (also called Digoxin), do NOT take this medication the morning of the procedure.  DO NOT STOP any blood thinners that you may be taking.  Have your blood drawn as intructed.  You will need someone with you to drive you home after the procedure.    Follow-Up: At [facility], you and your health needs are our priority.  As part of our continuing mission to provide you with exceptional heart care, we have created designated Provider Care Teams.  These Care Teams include your primary Cardiologist (physician) and Advanced Practice Providers (APPs -  Physician Assistants and Nurse Practitioners) who all work together to provide you with the care you need, when you need it.  We recommend signing up for the patient portal called "MyChart".  Sign up information is provided on this After Visit Summary.  MyChart is used to connect with patients for Virtual Visits (Telemedicine).  Patients are able to view lab/test results, encounter notes, upcoming appointments, etc.  Non-urgent messages can be sent to your provider as well.   To learn more about what you can do with MyChart, go to [email or web address].    Your next appointment:   2 month(s)  The format for your next appointment:   In Person  Provider:   Jenne Campus, MD  Other Instructions  Electrical Cardioversion Electrical cardioversion is the delivery of a jolt of electricity to restore a normal rhythm to the heart. A rhythm that is too fast or is not regular keeps the heart from pumping well. In this procedure, sticky patches or metal paddles are placed on the chest to deliver electricity to the heart from a device. This procedure may be done in an emergency if: There is low or no blood pressure as a  result of the heart rhythm. Normal rhythm must be restored as fast as possible to protect the brain and heart from further damage. It may save a life. This may also be a scheduled procedure for irregular or fast heart rhythms that are not immediately life-threatening. Tell a health care provider about: Any allergies you have. All medicines you are taking, including vitamins, herbs, eye drops, creams, and over-the-counter medicines. Any problems you or family members have had with anesthetic medicines. Any blood disorders you have. Any surgeries you have had. Any medical conditions you have. Whether you are pregnant or may be pregnant. What are the risks? Generally, this is a safe procedure. However, problems may occur, including: Allergic reactions to medicines. A blood clot that breaks free and travels to other parts of your body. The possible return of an abnormal heart rhythm within hours or days after the procedure. Your heart stopping (cardiac arrest). This is rare. What happens before the procedure? Medicines Your health care provider may have you start taking: Blood-thinning medicines (anticoagulants) so your blood does not clot as easily. Medicines to help stabilize your heart rate and rhythm. Ask your health care provider about: Changing or stopping your regular medicines. This is especially important if you are taking diabetes medicines or blood thinners. Taking medicines such as aspirin and ibuprofen. These medicines can thin your blood. Do not take these medicines unless your health care provider tells you to take them. Taking over-the-counter medicines, vitamins, herbs, and supplements. General instructions Follow instructions from your health care provider about eating or drinking restrictions. Plan to have someone take you home from the hospital or clinic. If you will be going home right after the procedure, plan to have someone with you for 24 hours. Ask your health  care provider what steps will be taken to help prevent infection. These may include washing your skin with a germ-killing soap. What happens during the procedure? An IV will be inserted into one of your veins. Sticky patches (electrodes) or metal paddles may be placed on your chest. You will be given a medicine to help you relax (sedative). An electrical shock will be delivered. The procedure may vary among health care providers and hospitals.    What can I expect after the procedure? Your blood pressure, heart rate, breathing rate, and blood oxygen level will be monitored until you leave the hospital or clinic. Your heart rhythm will be watched to make sure it does not change. You may have some redness on the skin where the shocks were given. Follow these instructions at home: Do not drive for 24 hours if you were given a sedative during your procedure. Take over-the-counter and prescription medicines only as told by your health care provider. Ask your health care provider how to check your pulse. Check it often. Rest for 48 hours after the procedure or as told by your health care provider. Avoid or limit your caffeine use as told by your health care provider. Keep all follow-up visits as told by your health care  provider. This is important. Contact a health care provider if: You feel like your heart is beating too quickly or your pulse is not regular. You have a serious muscle cramp that does not go away. Get help right away if: You have discomfort in your chest. You are dizzy or you feel faint. You have trouble breathing or you are short of breath. Your speech is slurred. You have trouble moving an arm or leg on one side of your body. Your fingers or toes turn cold or blue. Summary Electrical cardioversion is the delivery of a jolt of electricity to restore a normal rhythm to the heart. This procedure may be done right away in an emergency or may be a scheduled procedure if the  condition is not an emergency. Generally, this is a safe procedure. After the procedure, check your pulse often as told by your health care provider. This information is not intended to replace advice given to you by your health care provider. Make sure you discuss any questions you have with your health care provider. Document Revised: 05/05/2019 Document Reviewed: 05/05/2019 Elsevier Patient Education  2021 [place]: At [facility], you and your health needs are our priority.  As part of our continuing mission to provide you with exceptional heart care, we have created designated Provider Care Teams.  These Care Teams include your primary Cardiologist (physician) and Advanced Practice Providers (APPs -  Physician Assistants and Nurse Practitioners) who all work together to provide you with the care you need, when you need it.  We recommend signing up for the patient portal called "MyChart".  Sign up information is provided on this After Visit Summary.  MyChart is used to connect with patients for Virtual Visits (Telemedicine).  Patients are able to view lab/test results, encounter notes, upcoming appointments, etc.  Non-urgent messages can be sent to your provider as well.   To learn more about what you can do with MyChart, go to [email or web address].    Your next appointment:   2 month(s)  The format for your next appointment:   In Person  Provider:   Jenne Campus, MD    Other Instructions None

## 2021-11-10 NOTE — H&P (View-Only) (Signed)
Cardiology Office Note:    Date:  11/10/2021   ID:  [name], DOB 1961/04/25, MRN [id number]  PCP:  Inda Coke, [employer]  Cardiologist:  Jenne Campus, MD    Referring MD: Inda Coke, [place]   Chief Complaint  Patient presents with   Atrial Fibrillation    History of Present Illness:    Janet [name] is a 61 y.o. female with past medical history significant for palpitations she described those episodes for last few years but were never able to define the nature of arrhythmia until in December she showed up in our office with atrial fibrillation.  He was very appropriate put on anticoagulation beta-blocker echocardiogram has been performed which showed preserved left ventricle ejection fraction, stress test was done which showed no evidence of ischemia and she was sent back to me for evaluation.  She says she is doing fine denies have any chest pain tightness squeezing pressure burning chest.  Walking uphill will make her heart speeds up but no other complaints.  She has been taking anticoagulation religiously. Her past medical history is also significant for moderate obesity, I suspect obstructive sleep apnea, ascending aortic aneurysm measuring 46 mm based on latest assessment.  Past Medical History:  Diagnosis Date   Anemia    history of anemia from AUB   Anxiety    Breast cancer ([place]) 2012   left breast/  NO CHEMO   Complication of anesthesia    difficult IV access- never needed PICC line   Depression    no meds   Edema of both legs    Hypertension    ekg 1/13 on chart   Pneumonia 1989   hx   PONV (postoperative nausea and vomiting)    Umbilical hernia    currently - not repaired yet    Past Surgical History:  Procedure Laterality Date   ABDOMINAL HYSTERECTOMY  04/30/2012   Procedure: HYSTERECTOMY ABDOMINAL;  Surgeon: Imagene Gurney A. Alycia Rossetti, MD;  Location: [facility];  Service: Gynecology;  Laterality: N/A;   BREAST CAPSULECTOMY WITH IMPLANT EXCHANGE   10/02/2012   Procedure: BREAST CAPSULECTOMY WITH IMPLANT EXCHANGE;  Surgeon: Theodoro Kos, DO;  Location: [facility];  Service: Plastics;  Laterality: Left;   LEFT EXPANDER REMOVAL WITH CAPSULECTOMY AND PLACEMENT OF IMPLANT   BREAST IMPLANT EXCHANGE Left 01/20/2013   Procedure: REVISION OF LEFT BREAST  IMPLANT ;  Surgeon: Theodoro Kos, DO;  Location: [facility];  Service: Plastics;  Laterality: Left;   BREAST REDUCTION SURGERY N/A 01/20/2013   Procedure: Abdominal fat graft to breast reconstruction area;  Surgeon: Theodoro Kos, DO;  Location: [facility];  Service: Plastics;  Laterality: N/A;   BREAST SURGERY  2012   lt mastectomy/ axillary dissection   BREAST SURGERY  2012   tissue expander lt   COLONOSCOPY     DILATION AND CURETTAGE OF UTERUS  01/26/2012   Procedure: DILATATION AND CURETTAGE;  Surgeon: Lahoma Crocker, MD;  Location: [facility];  Service: Gynecology;;   ESOPHAGOGASTRODUODENOSCOPY     HERNIA REPAIR     LAPAROTOMY  04/30/2012   Procedure: EXPLORATORY LAPAROTOMY;  Surgeon: Lucita Lora. Alycia Rossetti, MD;  Location: [facility];  Service: Gynecology;  Laterality: N/A;   MASTECTOMY     Left side   MASTOPEXY  10/02/2012   Procedure: MASTOPEXY;  Surgeon: Theodoro Kos, DO;  Location: [facility];  Service: Plastics;  Laterality: Right;  RIGHT BREAST MASTOPEXY REDUCTION FOR SYMMETRY,   MULTIPLE EXTRACTIONS WITH ALVEOLOPLASTY  10/23/2011  MULTIPLE TOOTH EXTRACTIONS     NOVASURE ABLATION  03/01/2012   RECONSTRUCTION BREAST IMMEDIATE / DELAYED W/ TISSUE EXPANDER     tissue expander lt-9/12   SALPINGOOPHORECTOMY  04/30/2012   Procedure: SALPINGO OOPHERECTOMY;  Surgeon: Imagene Gurney A. Alycia Rossetti, MD;  Location: [facility];  Service: Gynecology;  Laterality: Bilateral;   TOE OSTEOTOMY     rt toe   VENTRAL HERNIA REPAIR  04/30/2012   Procedure: HERNIA REPAIR VENTRAL ADULT;  Surgeon: Imagene Gurney A. Alycia Rossetti, MD;  Location: [facility];  Service: Gynecology;  Laterality: N/A;    Current Medications: Current Meds   Medication Sig   apixaban (ELIQUIS) 5 MG TABS tablet Take 1 tablet (5 mg total) by mouth 2 (two) times daily.   Ascorbic Acid (VITAMIN C) 1000 MG tablet Take 1,000 mg by mouth 2 (two) times daily.   Calcium Carb-Cholecalciferol (CALCIUM 500/D PO) Take 600 mg by mouth daily.   FLUoxetine (PROZAC) 10 MG tablet Take 10 mg by mouth daily.   Inulin (FIBER CHOICE PO) Take 1 tablet by mouth daily. Unknown strength   lisinopril (ZESTRIL) 20 MG tablet TAKE ONE (1) TABLET BY MOUTH EVERY DAY (Patient taking differently: Take 20 mg by mouth daily. TAKE ONE (1) TABLET BY MOUTH EVERY DAY)   metolazone (ZAROXOLYN) 2.5 MG tablet Take 1 tablet (2.5 mg total) by mouth daily.   metoprolol succinate (TOPROL XL) 50 MG 24 hr tablet Take 0.5 tablets (25 mg total) by mouth daily.   Misc Natural Products (TART CHERRY ADVANCED) CAPS Take 1,200 capsules by mouth daily.   Multiple Vitamin (MULTIVITAMIN WITH MINERALS) TABS Take 1 tablet by mouth daily. Unknown strength   Potassium 99 MG TABS Take 99 mg by mouth daily.   Turmeric 500 MG CAPS Take 1 capsule by mouth daily.   vitamin B-12 (CYANOCOBALAMIN) 100 MCG tablet Take 50 mcg by mouth 2 (two) times daily.   Vitamin D, Ergocalciferol, (DRISDOL) 1.25 MG (50000 UNIT) CAPS capsule Take 10,000 Units by mouth every 7 (seven) days.     Allergies:   Petrolatum-zinc oxide, Amoxicillin, Clarithromycin, Ivp dye [iodinated contrast media], and Tape   Social History   Socioeconomic History   Marital status: Single    Spouse name: Not on file   Number of children: Not on file   Years of education: Not on file   Highest education level: Not on file  Occupational History   Not on file  Tobacco Use   Smoking status: Never   Smokeless tobacco: Never  Substance and Sexual Activity   Alcohol use: No   Drug use: No   Sexual activity: Never    Birth control/protection: None  Other Topics Concern   Not on file  Social History Narrative   Files tax returns; 2 years    Trained as bookkeeper and Press photographer   Social Determinants of Health   Financial Resource Strain: Not on file  Food Insecurity: Not on file  Transportation Needs: Not on file  Physical Activity: Not on file  Stress: Not on file  Social Connections: Not on file     Family History: The patient's family history includes Atrial fibrillation in her maternal grandmother and mother; COPD in her mother; Depression in her mother; Heart attack in her brother and mother; Prostate cancer in her father. ROS:   Please see the history of present illness.    All 14 point review of systems negative except as described per history of present illness  EKGs/Labs/Other Studies Reviewed:    Echocardiogram  done in September 2022 showed:   1. Left ventricular ejection fraction, by estimation, is 55 to 60%. The  left ventricle has normal function. The left ventricle has no regional  wall motion abnormalities. There is moderate concentric left ventricular  hypertrophy. Left ventricular  diastolic parameters are consistent with Grade II diastolic dysfunction  (pseudonormalization).   2. Right ventricular systolic function is normal. The right ventricular  size is normal. There is mildly elevated pulmonary artery systolic  pressure.   3. Left atrial size was mildly dilated.   4. The mitral valve is normal in structure. No evidence of mitral valve  regurgitation. No evidence of mitral stenosis.   5. The aortic valve is normal in structure. Aortic valve regurgitation is  not visualized. Mild aortic valve sclerosis is present, with no evidence  of aortic valve stenosis.   6. Abdominal aorta is dilated (2.2cm). Aneurysm of the ascending aorta,  measuring 46 mm.   7. The inferior vena cava is normal in size with greater than 50%  respiratory variability, suggesting right atrial pressure of 3 mmHg.   Stress test performed in our office yesterday showed:   Findings are consistent with no prior ischemia. The  study is intermediate risk.   Left ventricular function is abnormal. Nuclear stress EF: 46 %. The left ventricular ejection fraction is mildly decreased (45-54%). End diastolic cavity size is mildly enlarged.   Atrial fibrillation with elevated rate was noted.   Recent Labs: 10/04/2021: BUN 17; Creatinine, Ser 1.05; NT-Pro BNP 1,114; Potassium 4.2; Sodium 143  Recent Lipid Panel    Component Value Date/Time   CHOL 225 (H) 05/12/2019 0949   TRIG 166.0 (H) 05/12/2019 0949   HDL 46.20 05/12/2019 0949   CHOLHDL 5 05/12/2019 0949   VLDL 33.2 05/12/2019 0949   LDLCALC 145 (H) 05/12/2019 0949    Physical Exam:    VS:  BP 136/78 (BP Location: Left Arm, Patient Position: Sitting)    Pulse 95    Ht 5' 5.5" (1.664 m)    Wt (!) 364 lb 9.6 oz (165.4 kg)    LMP 03/15/2012    SpO2 97%    BMI 59.75 kg/m     Wt Readings from Last 3 Encounters:  11/10/21 (!) 364 lb 9.6 oz (165.4 kg)  11/09/21 (!) 365 lb (165.6 kg)  10/28/21 (!) 364 lb 1.6 oz (165.2 kg)     GEN:  Well nourished, well developed in no acute distress HEENT: Normal NECK: No JVD; No carotid bruits LYMPHATICS: No lymphadenopathy CARDIAC: Irregularly irregular, no murmurs, no rubs, no gallops RESPIRATORY:  Clear to auscultation without rales, wheezing or rhonchi  ABDOMEN: Soft, non-tender, non-distended MUSCULOSKELETAL:  No edema; No deformity  SKIN: Warm and dry LOWER EXTREMITIES: no swelling NEUROLOGIC:  Alert and oriented x 3 PSYCHIATRIC:  Normal affect   ASSESSMENT:    1. Paroxysmal atrial fibrillation (HCC)   2. Essential hypertension   3. DOE (dyspnea on exertion)   4. Morbid obesity ([place])    PLAN:    In order of problems listed above:  Persistent atrial fibrillation.  She is being anticoagulation since December.  She was very appropriate and put on anticoagulation that she continue religiously without any interruption.  We talked in length about cardioversion testing already 4 weeks of anticoagulation we will make  arrangements for cardioversion.  She knows what cardioversion is her mother had cardioversion done twice.  I will make arrangements for her.  Procedure was explained 1 more time including all  risk benefits as well as alternatives. Essential hypertension blood pressure seems to be well controlled continue present management. Sleep apnea I strongly suspect she does have it she does not wish to have a sleep study done in the laboratory therefore will hopefully able to do sleep study at home. Morbid obesity I told her that this is a big problem and losing weight will definitely help her to lose few pounds I think she can benefit from couple more pounds weight drop.   Medication Adjustments/Labs and Tests Ordered: Current medicines are reviewed at length with the patient today.  Concerns regarding medicines are outlined above.  Orders Placed This Encounter  Procedures   EKG 12-Lead   Medication changes: No orders of the defined types were placed in this encounter.   Signed, Park Liter, MD, [facility] 11/10/2021 2:22 PM    [place]

## 2021-11-10 NOTE — Progress Notes (Signed)
Cardiology Office Note:    Date:  11/10/2021   ID:  Janet [name], DOB 06-13-1961, MRN [id number]  PCP:  Janet [name], [place]  Cardiologist:  Janet Campus, MD    Referring MD: Janet [name], [place]   Chief Complaint  Patient presents with   Atrial Fibrillation    History of Present Illness:    Janet [name] is a 61 y.o. female with past medical history significant for palpitations she described those episodes for last few years but were never able to define the nature of arrhythmia until in December she showed up in our office with atrial fibrillation.  He was very appropriate put on anticoagulation beta-blocker echocardiogram has been performed which showed preserved left ventricle ejection fraction, stress test was done which showed no evidence of ischemia and she was sent back to me for evaluation.  She says she is doing fine denies have any chest pain tightness squeezing pressure burning chest.  Walking uphill will make her heart speeds up but no other complaints.  She has been taking anticoagulation religiously. Her past medical history is also significant for moderate obesity, I suspect obstructive sleep apnea, ascending aortic aneurysm measuring 46 mm based on latest assessment.  Past Medical History:  Diagnosis Date   Anemia    history of anemia from AUB   Anxiety    Breast cancer ([place]) 2012   left breast/  NO CHEMO   Complication of anesthesia    difficult IV access- never needed PICC line   Depression    no meds   Edema of both legs    Hypertension    ekg 1/13 on chart   Pneumonia 1989   hx   PONV (postoperative nausea and vomiting)    Umbilical hernia    currently - not repaired yet    Past Surgical History:  Procedure Laterality Date   ABDOMINAL HYSTERECTOMY  04/30/2012   Procedure: HYSTERECTOMY ABDOMINAL;  Surgeon: Imagene Gurney A. Alycia Rossetti, MD;  Location: [facility];  Service: Gynecology;  Laterality: N/A;   BREAST CAPSULECTOMY WITH IMPLANT EXCHANGE   10/02/2012   Procedure: BREAST CAPSULECTOMY WITH IMPLANT EXCHANGE;  Surgeon: Theodoro Kos, DO;  Location: [facility];  Service: Plastics;  Laterality: Left;   LEFT EXPANDER REMOVAL WITH CAPSULECTOMY AND PLACEMENT OF IMPLANT   BREAST IMPLANT EXCHANGE Left 01/20/2013   Procedure: REVISION OF LEFT BREAST  IMPLANT ;  Surgeon: Theodoro Kos, DO;  Location: [facility];  Service: Plastics;  Laterality: Left;   BREAST REDUCTION SURGERY N/A 01/20/2013   Procedure: Abdominal fat graft to breast reconstruction area;  Surgeon: Theodoro Kos, DO;  Location: [facility];  Service: Plastics;  Laterality: N/A;   BREAST SURGERY  2012   lt mastectomy/ axillary dissection   BREAST SURGERY  2012   tissue expander lt   COLONOSCOPY     DILATION AND CURETTAGE OF UTERUS  01/26/2012   Procedure: DILATATION AND CURETTAGE;  Surgeon: Lahoma Crocker, MD;  Location: [facility];  Service: Gynecology;;   ESOPHAGOGASTRODUODENOSCOPY     HERNIA REPAIR     LAPAROTOMY  04/30/2012   Procedure: EXPLORATORY LAPAROTOMY;  Surgeon: Lucita Lora. Alycia Rossetti, MD;  Location: [facility];  Service: Gynecology;  Laterality: N/A;   MASTECTOMY     Left side   MASTOPEXY  10/02/2012   Procedure: MASTOPEXY;  Surgeon: Theodoro Kos, DO;  Location: [facility];  Service: Plastics;  Laterality: Right;  RIGHT BREAST MASTOPEXY REDUCTION FOR SYMMETRY,   MULTIPLE EXTRACTIONS WITH ALVEOLOPLASTY  10/23/2011  MULTIPLE TOOTH EXTRACTIONS     NOVASURE ABLATION  03/01/2012   RECONSTRUCTION BREAST IMMEDIATE / DELAYED W/ TISSUE EXPANDER     tissue expander lt-9/12   SALPINGOOPHORECTOMY  04/30/2012   Procedure: SALPINGO OOPHERECTOMY;  Surgeon: Imagene Gurney A. Alycia Rossetti, MD;  [facility];  Service: Gynecology;  Laterality: Bilateral;   TOE OSTEOTOMY     rt toe   VENTRAL HERNIA REPAIR  04/30/2012   Procedure: HERNIA REPAIR VENTRAL ADULT;  Surgeon: Imagene Gurney A. Alycia Rossetti, MD;  [facility];  Service: Gynecology;  Laterality: N/A;    Current Medications: Current Meds   Medication Sig   apixaban (ELIQUIS) 5 MG TABS tablet Take 1 tablet (5 mg total) by mouth 2 (two) times daily.   Ascorbic Acid (VITAMIN C) 1000 MG tablet Take 1,000 mg by mouth 2 (two) times daily.   Calcium Carb-Cholecalciferol (CALCIUM 500/D PO) Take 600 mg by mouth daily.   FLUoxetine (PROZAC) 10 MG tablet Take 10 mg by mouth daily.   Inulin (FIBER CHOICE PO) Take 1 tablet by mouth daily. Unknown strength   lisinopril (ZESTRIL) 20 MG tablet TAKE ONE (1) TABLET BY MOUTH EVERY DAY (Patient taking differently: Take 20 mg by mouth daily. TAKE ONE (1) TABLET BY MOUTH EVERY DAY)   metolazone (ZAROXOLYN) 2.5 MG tablet Take 1 tablet (2.5 mg total) by mouth daily.   metoprolol succinate (TOPROL XL) 50 MG 24 hr tablet Take 0.5 tablets (25 mg total) by mouth daily.   Misc Natural Products (TART CHERRY ADVANCED) CAPS Take 1,200 capsules by mouth daily.   Multiple Vitamin (MULTIVITAMIN WITH MINERALS) TABS Take 1 tablet by mouth daily. Unknown strength   Potassium 99 MG TABS Take 99 mg by mouth daily.   Turmeric 500 MG CAPS Take 1 capsule by mouth daily.   vitamin B-12 (CYANOCOBALAMIN) 100 MCG tablet Take 50 mcg by mouth 2 (two) times daily.   Vitamin D, Ergocalciferol, (DRISDOL) 1.25 MG (50000 UNIT) CAPS capsule Take 10,000 Units by mouth every 7 (seven) days.     Allergies:   Petrolatum-zinc oxide, Amoxicillin, Clarithromycin, Ivp dye [iodinated contrast media], and Tape   Social History   Socioeconomic History   Marital status: Single    Spouse name: Not on file   Number of children: Not on file   Years of education: Not on file   Highest education level: Not on file  Occupational History   Not on file  Tobacco Use   Smoking status: Never   Smokeless tobacco: Never  Substance and Sexual Activity   Alcohol use: No   Drug use: No   Sexual activity: Never    Birth control/protection: None  Other Topics Concern   Not on file  Social History Narrative   Files tax returns; 2 years    Trained as bookkeeper and Press photographer   Social Determinants of Health   Financial Resource Strain: Not on file  Food Insecurity: Not on file  Transportation Needs: Not on file  Physical Activity: Not on file  Stress: Not on file  Social Connections: Not on file     Family History: The patient's family history includes Atrial fibrillation in her maternal grandmother and mother; COPD in her mother; Depression in her mother; Heart attack in her brother and mother; Prostate cancer in her father. ROS:   Please see the history of present illness.    All 14 point review of systems negative except as described per history of present illness  EKGs/Labs/Other Studies Reviewed:    Echocardiogram  done in September 2022 showed:   1. Left ventricular ejection fraction, by estimation, is 55 to 60%. The  left ventricle has normal function. The left ventricle has no regional  wall motion abnormalities. There is moderate concentric left ventricular  hypertrophy. Left ventricular  diastolic parameters are consistent with Grade II diastolic dysfunction  (pseudonormalization).   2. Right ventricular systolic function is normal. The right ventricular  size is normal. There is mildly elevated pulmonary artery systolic  pressure.   3. Left atrial size was mildly dilated.   4. The mitral valve is normal in structure. No evidence of mitral valve  regurgitation. No evidence of mitral stenosis.   5. The aortic valve is normal in structure. Aortic valve regurgitation is  not visualized. Mild aortic valve sclerosis is present, with no evidence  of aortic valve stenosis.   6. Abdominal aorta is dilated (2.2cm). Aneurysm of the ascending aorta,  measuring 46 mm.   7. The inferior vena cava is normal in size with greater than 50%  respiratory variability, suggesting right atrial pressure of 3 mmHg.   Stress test performed in our office yesterday showed:   Findings are consistent with no prior ischemia. The  study is intermediate risk.   Left ventricular function is abnormal. Nuclear stress EF: 46 %. The left ventricular ejection fraction is mildly decreased (45-54%). End diastolic cavity size is mildly enlarged.   Atrial fibrillation with elevated rate was noted.   Recent Labs: 10/04/2021: BUN 17; Creatinine, Ser 1.05; NT-Pro BNP 1,114; Potassium 4.2; Sodium 143  Recent Lipid Panel    Component Value Date/Time   CHOL 225 (H) 05/12/2019 0949   TRIG 166.0 (H) 05/12/2019 0949   HDL 46.20 05/12/2019 0949   CHOLHDL 5 05/12/2019 0949   VLDL 33.2 05/12/2019 0949   LDLCALC 145 (H) 05/12/2019 0949    Physical Exam:    VS:  BP 136/78 (BP Location: Left Arm, Patient Position: Sitting)    Pulse 95    Ht 5' 5.5" (1.664 m)    Wt (!) 364 lb 9.6 oz (165.4 kg)    LMP 03/15/2012    SpO2 97%    BMI 59.75 kg/m     Wt Readings from Last 3 Encounters:  11/10/21 (!) 364 lb 9.6 oz (165.4 kg)  11/09/21 (!) 365 lb (165.6 kg)  10/28/21 (!) 364 lb 1.6 oz (165.2 kg)     GEN:  Well nourished, well developed in no acute distress HEENT: Normal NECK: No JVD; No carotid bruits LYMPHATICS: No lymphadenopathy CARDIAC: Irregularly irregular, no murmurs, no rubs, no gallops RESPIRATORY:  Clear to auscultation without rales, wheezing or rhonchi  ABDOMEN: Soft, non-tender, non-distended MUSCULOSKELETAL:  No edema; No deformity  SKIN: Warm and dry LOWER EXTREMITIES: no swelling NEUROLOGIC:  Alert and oriented x 3 PSYCHIATRIC:  Normal affect   ASSESSMENT:    1. Paroxysmal atrial fibrillation (HCC)   2. Essential hypertension   3. DOE (dyspnea on exertion)   4. Morbid obesity ([place])    PLAN:    In order of problems listed above:  Persistent atrial fibrillation.  She is being anticoagulation since December.  She was very appropriate and put on anticoagulation that she continue religiously without any interruption.  We talked in length about cardioversion testing already 4 weeks of anticoagulation we will make  arrangements for cardioversion.  She knows what cardioversion is her mother had cardioversion done twice.  I will make arrangements for her.  Procedure was explained 1 more time including all  risk benefits as well as alternatives. Essential hypertension blood pressure seems to be well controlled continue present management. Sleep apnea I strongly suspect she does have it she does not wish to have a sleep study done in the laboratory therefore will hopefully able to do sleep study at home. Morbid obesity I told her that this is a big problem and losing weight will definitely help her to lose few pounds I think she can benefit from couple more pounds weight drop.   Medication Adjustments/Labs and Tests Ordered: Current medicines are reviewed at length with the patient today.  Concerns regarding medicines are outlined above.  Orders Placed This Encounter  Procedures   EKG 12-Lead   Medication changes: No orders of the defined types were placed in this encounter.   Signed, Park Liter, MD, [facility] 11/10/2021 2:22 PM    [place]

## 2021-11-10 NOTE — Progress Notes (Signed)
Ekg

## 2021-11-10 NOTE — Telephone Encounter (Signed)
° °  Pt is calling to schedule appt for cardioversion, she said the 02/10 will work if its in the afternoon.

## 2021-11-17 ENCOUNTER — Other Ambulatory Visit: Payer: Self-pay

## 2021-11-17 DIAGNOSIS — D649 Anemia, unspecified: Secondary | ICD-10-CM

## 2021-11-17 NOTE — Telephone Encounter (Signed)
Called patient and scheduled cardioversion. Reviewed the instructions for her cardioversion with her. Patient has no questions or concerns at this time. Will have a copy of her instructions for the cardioversion at the [facility] office for her to pick up.

## 2021-11-24 ENCOUNTER — Encounter (HOSPITAL_COMMUNITY): Payer: Self-pay | Admitting: Internal Medicine

## 2021-11-24 NOTE — Progress Notes (Signed)
Attempted to obtain medical history via telephone, unable to reach at this time. I left a voicemail to return pre surgical testing department's phone call.  

## 2021-11-28 ENCOUNTER — Ambulatory Visit: Payer: Self-pay | Admitting: Surgery

## 2021-12-02 ENCOUNTER — Other Ambulatory Visit: Payer: Self-pay

## 2021-12-02 ENCOUNTER — Encounter (HOSPITAL_COMMUNITY): Payer: Self-pay | Admitting: Internal Medicine

## 2021-12-02 ENCOUNTER — Ambulatory Visit (HOSPITAL_BASED_OUTPATIENT_CLINIC_OR_DEPARTMENT_OTHER): Payer: Managed Care, Other (non HMO) | Admitting: Anesthesiology

## 2021-12-02 ENCOUNTER — Ambulatory Visit (HOSPITAL_COMMUNITY)
Admission: RE | Admit: 2021-12-02 | Discharge: 2021-12-02 | Disposition: A | Discharge status: Home / Self Care | Payer: Managed Care, Other (non HMO) | Attending: Internal Medicine | Admitting: Internal Medicine

## 2021-12-02 ENCOUNTER — Ambulatory Visit (HOSPITAL_COMMUNITY): Payer: Managed Care, Other (non HMO) | Admitting: Anesthesiology

## 2021-12-02 ENCOUNTER — Encounter (HOSPITAL_COMMUNITY)
Admission: RE | Disposition: A | Discharge status: Home / Self Care | Payer: Self-pay | Source: Home / Self Care | Attending: Internal Medicine

## 2021-12-02 DIAGNOSIS — Z7901 Long term (current) use of anticoagulants: Secondary | ICD-10-CM | POA: Insufficient documentation

## 2021-12-02 DIAGNOSIS — I480 Paroxysmal atrial fibrillation: Secondary | ICD-10-CM | POA: Insufficient documentation

## 2021-12-02 DIAGNOSIS — Z853 Personal history of malignant neoplasm of breast: Secondary | ICD-10-CM | POA: Diagnosis not present

## 2021-12-02 DIAGNOSIS — Z6841 Body Mass Index (BMI) 40.0 and over, adult: Secondary | ICD-10-CM | POA: Insufficient documentation

## 2021-12-02 DIAGNOSIS — I358 Other nonrheumatic aortic valve disorders: Secondary | ICD-10-CM | POA: Diagnosis not present

## 2021-12-02 DIAGNOSIS — Z79899 Other long term (current) drug therapy: Secondary | ICD-10-CM | POA: Insufficient documentation

## 2021-12-02 DIAGNOSIS — I4891 Unspecified atrial fibrillation: Secondary | ICD-10-CM

## 2021-12-02 DIAGNOSIS — F32A Depression, unspecified: Secondary | ICD-10-CM | POA: Insufficient documentation

## 2021-12-02 DIAGNOSIS — I10 Essential (primary) hypertension: Secondary | ICD-10-CM

## 2021-12-02 DIAGNOSIS — F419 Anxiety disorder, unspecified: Secondary | ICD-10-CM | POA: Diagnosis not present

## 2021-12-02 DIAGNOSIS — I4819 Other persistent atrial fibrillation: Secondary | ICD-10-CM | POA: Diagnosis not present

## 2021-12-02 DIAGNOSIS — Z8249 Family history of ischemic heart disease and other diseases of the circulatory system: Secondary | ICD-10-CM | POA: Insufficient documentation

## 2021-12-02 DIAGNOSIS — I7121 Aneurysm of the ascending aorta, without rupture: Secondary | ICD-10-CM | POA: Insufficient documentation

## 2021-12-02 HISTORY — PX: CARDIOVERSION: SHX1299

## 2021-12-02 SURGERY — CARDIOVERSION
Anesthesia: Monitor Anesthesia Care

## 2021-12-02 MED ORDER — SODIUM CHLORIDE 0.9 % IV SOLN
INTRAVENOUS | Status: AC | PRN
  Administered 2021-12-02: 500 mL via INTRAVENOUS

## 2021-12-02 MED ORDER — PROPOFOL 10 MG/ML IV BOLUS
INTRAVENOUS | Status: DC | PRN
  Administered 2021-12-02: 100 mg via INTRAVENOUS

## 2021-12-02 MED ORDER — SODIUM CHLORIDE 0.9 % IV SOLN: INTRAVENOUS | Status: DC | PRN

## 2021-12-02 MED ORDER — LIDOCAINE 2% (20 MG/ML) 5 ML SYRINGE
INTRAMUSCULAR | Status: DC | PRN
  Administered 2021-12-02: 40 mg via INTRAVENOUS

## 2021-12-02 MED ORDER — SODIUM CHLORIDE 0.9 % IV SOLN: INTRAVENOUS | Status: DC

## 2021-12-02 NOTE — Transfer of Care (Signed)
Immediate Anesthesia Transfer of Care Note  Patient: Janet [name]  Procedure(s) Performed: CARDIOVERSION  Patient Location: Endoscopy Unit  Anesthesia Type:MAC  Level of Consciousness: awake and drowsy  Airway & Oxygen Therapy: Patient Spontanous Breathing  Post-op Assessment: Report given to RN and Post -op Vital signs reviewed and stable  Post vital signs: Reviewed and stable  Last Vitals:  Vitals Value Taken Time  BP    Temp    Pulse 58 12/02/21 1315  Resp 24 12/02/21 1315  SpO2 93 % 12/02/21 1315    Last Pain:  Vitals:   12/02/21 1203  TempSrc: (P) Oral  PainSc: (P) 0-No pain         Complications: No notable events documented.

## 2021-12-02 NOTE — Anesthesia Postprocedure Evaluation (Signed)
Anesthesia Post Note  Patient: Janet [name]  Procedure(s) Performed: CARDIOVERSION     Patient location during evaluation: Endoscopy Anesthesia Type: MAC Level of consciousness: awake Pain management: pain level controlled Cardiovascular status: stable Postop Assessment: no apparent nausea or vomiting Anesthetic complications: no   No notable events documented.  Last Vitals:  Vitals:   12/02/21 1335 12/02/21 1340  BP: (!) 136/98 (!) 137/93  Pulse: (!) 54 (!) 53  Resp: 18 16  Temp:    SpO2: 100% 100%    Last Pain:  Vitals:   12/02/21 1340  TempSrc:   PainSc: 0-No pain                 Annahi Short

## 2021-12-02 NOTE — CV Procedure (Signed)
° °  CARDIOVERSION NOTE  Procedure: Electrical Cardioversion Indications:  Atrial Fibrillation  Procedure Details:  Consent: Risks of procedure as well as the alternatives and risks of each were explained to the (patient/caregiver).  Consent for procedure obtained.  Time Out: Verified patient identification, verified procedure, site/side was marked, verified correct patient position, special equipment/implants available, medications/allergies/relevent history reviewed, required imaging and test results available.  Performed  Patient placed on cardiac monitor, pulse oximetry, supplemental oxygen as necessary.  Sedation given:  propofol per anesthesia Pacer pads placed anterior and posterior chest.  Cardioverted 1 time(s).  Cardioverted at 200J biphasic.  Impression: Findings: Post procedure EKG shows: NSR Complications: None Patient did tolerate procedure well.  Plan: Successful DCCV with a single 200J biphasic shock to NSR.  Time Spent Directly with the Patient:  30 minutes   Pixie Casino, MD, [facility], [place] Director of the Advanced Lipid Disorders &  Cardiovascular Risk Reduction Clinic Diplomate of the American Board of Clinical Lipidology Attending Cardiologist  Direct Dial: [phone]   Fax: [phone]  Website:  [email or web address] 12/02/2021, 1:16 PM

## 2021-12-02 NOTE — Discharge Instructions (Signed)

## 2021-12-02 NOTE — Interval H&P Note (Signed)
History and Physical Interval Note:  12/02/2021 12:47 PM  Janet [name]  has presented today for surgery, with the diagnosis of AFIB.  The various methods of treatment have been discussed with the patient and family. After consideration of risks, benefits and other options for treatment, the patient has consented to  Procedure(s): CARDIOVERSION (N/A) as a surgical intervention.  The patient's history has been reviewed, patient examined, no change in status, stable for surgery.  I have reviewed the patient's chart and labs.  Questions were answered to the patient's satisfaction.     Pixie Casino

## 2021-12-02 NOTE — Anesthesia Preprocedure Evaluation (Addendum)
Anesthesia Evaluation  Patient identified by MRN, date of birth, ID band Patient awake    Reviewed: Allergy & Precautions, NPO status , Patient's Chart, lab work & pertinent test results  History of Anesthesia Complications (+) PONV and history of anesthetic complications  Airway Mallampati: II  TM Distance: >3 FB     Dental   Pulmonary    breath sounds clear to auscultation       Cardiovascular hypertension, + DOE   Rhythm:Regular Rate:Normal  History noted Dr. Nyoka Cowden   Neuro/Psych    GI/Hepatic negative GI ROS, Neg liver ROS,   Endo/Other  negative endocrine ROS  Renal/GU negative Renal ROS     Musculoskeletal   Abdominal   Peds  Hematology   Anesthesia Other Findings   Reproductive/Obstetrics                             Anesthesia Physical Anesthesia Plan  ASA: 3  Anesthesia Plan: MAC   Post-op Pain Management:    Induction: Intravenous  PONV Risk Score and Plan: Treatment may vary due to age or medical condition and Propofol infusion  Airway Management Planned: Simple Face Mask  Additional Equipment:   Intra-op Plan:   Post-operative Plan:   Informed Consent: I have reviewed the patients History and Physical, chart, labs and discussed the procedure including the risks, benefits and alternatives for the proposed anesthesia with the patient or authorized representative who has indicated his/her understanding and acceptance.       Plan Discussed with: CRNA and Anesthesiologist  Anesthesia Plan Comments:         Anesthesia Quick Evaluation

## 2021-12-04 ENCOUNTER — Encounter (HOSPITAL_COMMUNITY): Payer: Self-pay | Admitting: Internal Medicine

## 2021-12-05 LAB — POCT I-STAT, CHEM 8
BUN: 25 mg/dL — ABNORMAL HIGH (ref 6–20)
Calcium, Ion: 1.03 mmol/L — ABNORMAL LOW (ref 1.15–1.40)
Chloride: 100 mmol/L (ref 98–111)
Creatinine, Ser: 1 mg/dL (ref 0.44–1.00)
Glucose, Bld: 109 mg/dL — ABNORMAL HIGH (ref 70–99)
HCT: 40 % (ref 36.0–46.0)
Hemoglobin: 13.6 g/dL (ref 12.0–15.0)
Potassium: 3.6 mmol/L (ref 3.5–5.1)
Sodium: 137 mmol/L (ref 135–145)
TCO2: 32 mmol/L (ref 22–32)

## 2021-12-12 NOTE — Patient Instructions (Signed)
DUE TO COVID-19 ONLY ONE VISITOR IS ALLOWED TO COME WITH YOU AND STAY IN THE WAITING ROOM ONLY DURING PRE OP AND PROCEDURE.   **NO VISITORS ARE ALLOWED IN THE SHORT STAY AREA OR RECOVERY ROOM!!**  IF YOU WILL BE ADMITTED INTO THE HOSPITAL YOU ARE ALLOWED ONLY TWO SUPPORT PEOPLE DURING VISITATION HOURS ONLY (7 AM -8PM)   The support person(s) must pass our screening, gel in and out, and wear a mask at all times, including in the patients room. Patients must also wear a mask when staff or their support person are in the room. Visitors GUEST BADGE MUST BE WORN VISIBLY  One adult visitor may remain with you overnight and MUST be in the room by 8 P.M.  No visitors under the age of 84. Any visitor under the age of 13 must be accompanied by an adult.    COVID SWAB TESTING MUST BE COMPLETED ON:  12/22/21   Site: [facility] [place] [place] Enter: Main Entrance have a seat in the waiting area to the right of main entrance (DO NOT [place]!!!!!) Dial: [phone] to alert staff you have arrived  You are not required to quarantine, however you are required to wear a well-fitted mask when you are out and around people not in your household.  Hand Hygiene often Do NOT share personal items Notify your provider if you are in close contact with someone who has COVID or you develop fever 100.4 or greater, new onset of sneezing, cough, sore throat, shortness of breath or body aches.  [place], [address], must go inside of the hospital, NOT A DRIVE THRU!  (Must self quarantine after testing. Follow instructions on handout.)       Your procedure is scheduled on: 12/26/21   Report to [facility] Main Entrance    Report to Short Stay at : 5:15 AM   Call this number if you have problems the morning of surgery [phone]   Do not eat food :After Midnight.   After Midnight you may have the  following liquids until: 4:30 AM  DAY OF SURGERY  Water Black Coffee (sugar ok, NO MILK/CREAM OR CREAMERS)  Tea (sugar ok, NO MILK/CREAM OR CREAMERS) regular and decaf                             Plain Jell-O (NO RED)                                           Fruit ices (not with fruit pulp, NO RED)                                     Popsicles (NO RED)                                                                  Juice: apple, WHITE grape, WHITE cranberry Sports drinks like Gatorade (NO RED) Clear broth(vegetable,chicken,beef)  FOLLOW BOWEL PREP AND ANY ADDITIONAL PRE OP INSTRUCTIONS YOU RECEIVED FROM YOUR SURGEON'S OFFICE!!!   Oral Hygiene is also important to reduce your risk of infection.                                    Remember - BRUSH YOUR TEETH THE MORNING OF SURGERY WITH YOUR REGULAR TOOTHPASTE   Do NOT smoke after Midnight   Take these medicines the morning of surgery with A SIP OF WATER: fluoxetine,metoprolol.  DO NOT TAKE ANY ORAL DIABETIC MEDICATIONS DAY OF YOUR SURGERY                              You may not have any metal on your body including hair pins, jewelry, and body piercing             Do not wear make-up, lotions, powders, perfumes/cologne, or deodorant  Do not wear nail polish including gel and S&S, artificial/acrylic nails, or any other type of covering on natural nails including finger and toenails. If you have artificial nails, gel coating, etc. that needs to be removed by a nail salon please have this removed prior to surgery or surgery may need to be canceled/ delayed if the surgeon/ anesthesia feels like they are unable to be safely monitored.   Do not shave  48 hours prior to surgery.   Do not bring valuables to the hospital. [place].   Contacts, dentures or bridgework may not be worn into surgery.   Bring small overnight bag day of surgery.    Patients discharged on the day of surgery will  not be allowed to drive home.  Someone NEEDS to stay with you for the first 24 hours after anesthesia.   Special Instructions: Bring a copy of your healthcare power of attorney and living will documents         the day of surgery if you haven't scanned them before.              Please read over the following fact sheets you were given: IF YOU HAVE QUESTIONS ABOUT YOUR PRE-OP INSTRUCTIONS PLEASE CALL [phone]     [facility] - Preparing for Surgery Before surgery, you can play an important role.  Because skin is not sterile, your skin needs to be as free of germs as possible.  You can reduce the number of germs on your skin by washing with CHG (chlorahexidine gluconate) soap before surgery.  CHG is an antiseptic cleaner which kills germs and bonds with the skin to continue killing germs even after washing. Please DO NOT use if you have an allergy to CHG or antibacterial soaps.  If your skin becomes reddened/irritated stop using the CHG and inform your nurse when you arrive at Short Stay. Do not shave (including legs and underarms) for at least 48 hours prior to the first CHG shower.  You may shave your face/neck. Please follow these instructions carefully:  1.  Shower with CHG Soap the night before surgery and the  morning of Surgery.  2.  If you choose to wash your hair, wash your hair first as usual with your  normal  shampoo.  3.  After you shampoo, rinse your hair and body thoroughly to  remove the  shampoo.                           4.  Use CHG as you would any other liquid soap.  You can apply chg directly  to the skin and wash                       Gently with a scrungie or clean washcloth.  5.  Apply the CHG Soap to your body ONLY FROM THE NECK DOWN.   Do not use on face/ open                           Wound or open sores. Avoid contact with eyes, ears mouth and genitals (private parts).                       Wash face,  Genitals (private parts) with your normal soap.             6.  Wash  thoroughly, paying special attention to the area where your surgery  will be performed.  7.  Thoroughly rinse your body with warm water from the neck down.  8.  DO NOT shower/wash with your normal soap after using and rinsing off  the CHG Soap.                9.  Pat yourself dry with a clean towel.            10.  Wear clean pajamas.            11.  Place clean sheets on your bed the night of your first shower and do not  sleep with pets. Day of Surgery : Do not apply any lotions/deodorants the morning of surgery.  Please wear clean clothes to the hospital/surgery center.  FAILURE TO FOLLOW THESE INSTRUCTIONS MAY RESULT IN THE CANCELLATION OF YOUR SURGERY PATIENT SIGNATURE_________________________________  NURSE SIGNATURE__________________________________  ________________________________________________________________________

## 2021-12-13 ENCOUNTER — Other Ambulatory Visit: Payer: Self-pay

## 2021-12-13 ENCOUNTER — Encounter (HOSPITAL_COMMUNITY): Payer: Self-pay

## 2021-12-13 ENCOUNTER — Encounter (HOSPITAL_COMMUNITY)
Admission: RE | Admit: 2021-12-13 | Discharge: 2021-12-13 | Disposition: A | Discharge status: Home / Self Care | Payer: Managed Care, Other (non HMO) | Source: Ambulatory Visit | Attending: Surgery | Admitting: Surgery

## 2021-12-13 VITALS — BP 124/85 | HR 65 | Temp 99.1°F | Resp 18 | Ht 65.5 in | Wt 360.0 lb

## 2021-12-13 DIAGNOSIS — I10 Essential (primary) hypertension: Secondary | ICD-10-CM

## 2021-12-13 DIAGNOSIS — Z01812 Encounter for preprocedural laboratory examination: Secondary | ICD-10-CM | POA: Diagnosis not present

## 2021-12-13 HISTORY — DX: Cardiac arrhythmia, unspecified: I49.9

## 2021-12-13 HISTORY — DX: Unspecified osteoarthritis, unspecified site: M19.90

## 2021-12-13 LAB — BASIC METABOLIC PANEL
Anion gap: 9 (ref 5–15)
BUN: 19 mg/dL (ref 6–20)
CO2: 29 mmol/L (ref 22–32)
Calcium: 8.8 mg/dL — ABNORMAL LOW (ref 8.9–10.3)
Chloride: 98 mmol/L (ref 98–111)
Creatinine, Ser: 0.89 mg/dL (ref 0.44–1.00)
GFR, Estimated: >60 mL/min (ref >60)
Glucose, Bld: 104 mg/dL — ABNORMAL HIGH (ref 70–99)
Potassium: 3.8 mmol/L (ref 3.5–5.1)
Sodium: 136 mmol/L (ref 135–145)

## 2021-12-13 LAB — CBC
HCT: 43.8 % (ref 36.0–46.0)
Hemoglobin: 14.8 g/dL (ref 12.0–15.0)
MCH: 31.6 pg (ref 26.0–34.0)
MCHC: 33.8 g/dL (ref 30.0–36.0)
MCV: 93.6 fL (ref 80.0–100.0)
Platelets: 275 10*3/uL (ref 150–400)
RBC: 4.68 MIL/uL (ref 3.87–5.11)
RDW: 13.3 % (ref 11.5–15.5)
WBC: 9.9 10*3/uL (ref 4.0–10.5)
nRBC: 0 % (ref 0.0–0.2)

## 2021-12-13 NOTE — Progress Notes (Deleted)
°   12/13/21 1441  OBSTRUCTIVE SLEEP APNEA  Score 5 or greater  Results sent to PCP

## 2021-12-13 NOTE — Progress Notes (Signed)
°   12/13/21 1406  OBSTRUCTIVE SLEEP APNEA  Have you ever been diagnosed with sleep apnea through a sleep study? No  Do you snore loudly (loud enough to be heard through closed doors)?  0  Do you often feel tired, fatigued, or sleepy during the daytime (such as falling asleep during driving or talking to someone)? 1  Has anyone observed you stop breathing during your sleep? 0  Do you have, or are you being treated for high blood pressure? 1  BMI more than 35 kg/m2? 1  Age > 50 (1-yes) 1  Neck circumference greater than:Female 16 inches or larger, Female 17inches or larger? 1  Female Gender (Yes=1) 0  Obstructive Sleep Apnea Score 5

## 2021-12-13 NOTE — Progress Notes (Signed)
For Short Stay: [place] appointment date: 12/22/21 @ 1:45 PM Date of COVID positive in last 90 days: N/A COVID Vaccine: Pfizer x 2: 2021 Bowel Prep reminder:N/A   For Anesthesia: PCP - Inda Coke: PA Cardiologist - Dr. Jyl Heinz.  Chest x-ray -  EKG - 12/02/21 Stress Test -  ECHO - 07/01/21 Cardiac Cath -  Pacemaker/ICD device last checked: Pacemaker orders received: Device Rep notified:  Spinal Cord Stimulator:  Sleep Study -  CPAP -   Fasting Blood Sugar -  Checks Blood Sugar _____ times a day Date and result of last Hgb A1c-  Blood Thinner Instructions: Pt. Was advised to call MD. For instruction on Eliquis. Aspirin Instructions: Last Dose:  Activity level: Can go up a flight of stairs and activities of daily living without stopping and without chest pain and/or shortness of breath   Able to exercise without chest pain and/or shortness of breath   Unable to go up a flight of stairs without chest pain and/or shortness of breath     Anesthesia review: Hx: Afib,HTN  Patient denies shortness of breath, fever, cough and chest pain at PAT appointment   Patient verbalized understanding of instructions that were given to them at the PAT appointment. Patient was also instructed that they will need to review over the PAT instructions again at home before surgery.

## 2021-12-14 ENCOUNTER — Telehealth: Payer: Self-pay | Admitting: Cardiology

## 2021-12-14 NOTE — Telephone Encounter (Signed)
Clearance was already addressed 1/10 note to hold Eliquis for 2 days. ?

## 2021-12-14 NOTE — Telephone Encounter (Signed)
Left message for patient to call back  

## 2021-12-14 NOTE — Telephone Encounter (Signed)
Pt c/o medication issue: ? ?1. Name of Medication: apixaban (ELIQUIS) 5 MG TABS tablet ? ?2. How are you currently taking this medication (dosage and times per day)? 1 tablet twice a day ? ?3. Are you having a reaction (difficulty breathing--STAT)? no ? ?4. What is your medication issue? Patient states her surgeon already received clearance to hold her medication for her upcoming surgery, but they requested she call to see how long she should hold it. Clearance note 1/10. ?

## 2021-12-15 ENCOUNTER — Encounter (HOSPITAL_COMMUNITY): Payer: Self-pay | Admitting: Physician Assistant

## 2021-12-16 NOTE — Progress Notes (Signed)
Anesthesia Chart Review   Case: [id number] Date/Time: 12/26/21 0715   Procedure: robotic incisional hernia repair with mesh   Anesthesia type: General   Pre-op diagnosis: incisional hernia   [facility] / [facility]   Surgeons: Felicie Morn, MD       DISCUSSION:61 y.o. never smoker with h/o PONV, HTN, breast cancer, incisional hernia scheduled for above procedure 12/26/2021 with Dr. Louanna Raw.   Pt s/p cardioversion for persistent A-fib 12/02/21. Pt should stay on anticoagulants 4 weeks s/p cardioversion.  Discussed with Dr. Agustin Cree who agrees it would be safer to postpone case until she has been on anticoagulant 4 weeks after cardioversion.  Discussed with Dr. Donovan Kail office.  Can be reschedule after 01/02/22.  VS: BP 124/85    Pulse 65    Temp 37.3 C (Oral)    Resp 18    Ht 5' 5.5" (1.664 m)    Wt (!) 163.3 kg    LMP 03/15/2012    SpO2 96%    BMI 59.00 kg/m   PROVIDERS: Inda Coke, PA is PCP   Tristan Schroeder, MD is Cardiologist LABS: Labs reviewed: Acceptable for surgery. (all labs ordered are listed, but only abnormal results are displayed)  Labs Reviewed  BASIC METABOLIC PANEL - Abnormal; Notable for the following components:      Result Value   Glucose, Bld 104 (*)    Calcium 8.8 (*)    All other components within normal limits  CBC     IMAGES:   EKG: 12/02/2021 Rate 55 bpm  Sinus bradycardia Otherwise normal ECG When compared with ECG of 14-Jan-2013 14:33, PREVIOUS ECG IS PRESENT No significant change since last tracing  CV: Myocardial Perfusion 11/10/2021   Findings are consistent with no prior ischemia. The study is intermediate risk.   Left ventricular function is abnormal. Nuclear stress EF: 46 %. The left ventricular ejection fraction is mildly decreased (45-54%). End diastolic cavity size is mildly enlarged.   Atrial fibrillation with elevated rate was noted.   Echo 07/01/2021 1. Left ventricular ejection fraction, by  estimation, is 55 to 60%. The  left ventricle has normal function. The left ventricle has no regional  wall motion abnormalities. There is moderate concentric left ventricular  hypertrophy. Left ventricular  diastolic parameters are consistent with Grade II diastolic dysfunction  (pseudonormalization).   2. Right ventricular systolic function is normal. The right ventricular  size is normal. There is mildly elevated pulmonary artery systolic  pressure.   3. Left atrial size was mildly dilated.   4. The mitral valve is normal in structure. No evidence of mitral valve  regurgitation. No evidence of mitral stenosis.   5. The aortic valve is normal in structure. Aortic valve regurgitation is  not visualized. Mild aortic valve sclerosis is present, with no evidence  of aortic valve stenosis.   6. Abdominal aorta is dilated (2.2cm). Aneurysm of the ascending aorta,  measuring 46 mm.   7. The inferior vena cava is normal in size with greater than 50%  respiratory variability, suggesting right atrial pressure of 3 mmHg.  Past Medical History:  Diagnosis Date   Anemia    history of anemia from AUB   Anxiety    Arthritis    Breast cancer ([place]) 10/16/2010   left breast/  NO CHEMO   Complication of anesthesia    difficult IV access- never needed PICC line   Depression    no meds   Dysrhythmia    Edema of  both legs    Hypertension    ekg 1/13 on chart   Pneumonia 10/17/1987   hx   PONV (postoperative nausea and vomiting)    Umbilical hernia    currently - not repaired yet    Past Surgical History:  Procedure Laterality Date   ABDOMINAL HYSTERECTOMY  04/30/2012   Procedure: HYSTERECTOMY ABDOMINAL;  Surgeon: Imagene Gurney A. Alycia Rossetti, MD;  [facility];  Service: Gynecology;  Laterality: N/A;   ABLATION  2012   BREAST CAPSULECTOMY WITH IMPLANT EXCHANGE  10/02/2012   Procedure: BREAST CAPSULECTOMY WITH IMPLANT EXCHANGE;  Surgeon: Theodoro Kos, DO;  Location: [facility];   Service: Plastics;  Laterality: Left;   LEFT EXPANDER REMOVAL WITH CAPSULECTOMY AND PLACEMENT OF IMPLANT   BREAST IMPLANT EXCHANGE Left 01/20/2013   Procedure: REVISION OF LEFT BREAST  IMPLANT ;  Surgeon: Theodoro Kos, DO;  Location: [facility];  Service: Plastics;  Laterality: Left;   BREAST REDUCTION SURGERY N/A 01/20/2013   Procedure: Abdominal fat graft to breast reconstruction area;  Surgeon: Theodoro Kos, DO;  Location: [place];  Service: Plastics;  Laterality: N/A;   BREAST SURGERY  10/16/2010   lt mastectomy/ axillary dissection   BREAST SURGERY  10/16/2010   tissue expander lt   CARDIOVERSION N/A 12/02/2021   Procedure: CARDIOVERSION;  Surgeon: Pixie Casino, MD;  Location: [facility];  Service: Cardiovascular;  Laterality: N/A;   COLONOSCOPY     DILATION AND CURETTAGE OF UTERUS  01/26/2012   Procedure: DILATATION AND CURETTAGE;  Surgeon: Lahoma Crocker, MD;  Location: [facility];  Service: Gynecology;;   ESOPHAGOGASTRODUODENOSCOPY     HERNIA REPAIR     LAPAROTOMY  04/30/2012   Procedure: EXPLORATORY LAPAROTOMY;  Surgeon: Imagene Gurney A. Alycia Rossetti, MD;  [facility];  Service: Gynecology;  Laterality: N/A;   MASTECTOMY     Left side   MASTOPEXY  10/02/2012   Procedure: MASTOPEXY;  Surgeon: Theodoro Kos, DO;  Location: [facility];  Service: Plastics;  Laterality: Right;  RIGHT BREAST MASTOPEXY REDUCTION FOR SYMMETRY,   MULTIPLE EXTRACTIONS WITH ALVEOLOPLASTY  10/23/2011   MULTIPLE TOOTH EXTRACTIONS     NOVASURE ABLATION  03/01/2012   RECONSTRUCTION BREAST IMMEDIATE / DELAYED W/ TISSUE EXPANDER     tissue expander lt-9/12   SALPINGOOPHORECTOMY  04/30/2012   Procedure: SALPINGO OOPHERECTOMY;  Surgeon: Imagene Gurney A. Alycia Rossetti, MD;  [facility];  Service: Gynecology;  Laterality: Bilateral;   TOE OSTEOTOMY     rt toe   VENTRAL HERNIA REPAIR  04/30/2012   Procedure: HERNIA REPAIR VENTRAL ADULT;  Surgeon: Imagene Gurney A. Alycia Rossetti, MD;  [facility];  Service: Gynecology;   Laterality: N/A;    MEDICATIONS:  apixaban (ELIQUIS) 5 MG TABS tablet   Calcium Carb-Cholecalciferol (CALCIUM 500/D PO)   FLUoxetine (PROZAC) 10 MG tablet   lisinopril (ZESTRIL) 20 MG tablet   metolazone (ZAROXOLYN) 2.5 MG tablet   metoprolol succinate (TOPROL-XL) 50 MG 24 hr tablet   Misc Natural Products (TART CHERRY ADVANCED) CAPS   Multiple Vitamin (MULTIVITAMIN WITH MINERALS) TABS   Potassium 99 MG TABS   Turmeric 500 MG CAPS   vitamin B-12 (CYANOCOBALAMIN) 100 MCG tablet   vitamin C (ASCORBIC ACID) 500 MG tablet   No current facility-administered medications for this encounter.   Konrad Felix Ward, PA-C [facility] [phone]

## 2021-12-19 ENCOUNTER — Telehealth: Payer: Self-pay | Admitting: Cardiology

## 2021-12-19 NOTE — Telephone Encounter (Signed)
I called and spoke with the patient. ?She is VERY upset in regards to her upcoming hernia surgery and feels our office is at fault for this. ? ?The patient is currently scheduled for hernia surgery on 12/26/21.  ? ?- 10/25/21: Initial call for clearance came through our pre-op pool requesting clearance to hold Eliquis prior to incisional hernia repair.  ?- 10/26/21: Our pharmacy pre-op reviewed this advised the patient needs to hold Eliquis x 2 days prior to her procedure. ?- 10/27/21: Christen Bame, NP routed all recommendations to the requesting party via [place] [facility] fax ?- 11/10/21: seen in office with Dr. Agustin Cree in atrial fibrillation and DCCV recommended at that time. ?- 12/02/21: DCCV performed by Dr. Hilty>> converted to NSR x 1 shock ?- 12/14/21: Call received from the patient stating her surgeon had already received her clearance to hold eliquis for her upcoming surgery, but they requested she call to see how long she should hold it ?- 12/14/21: per Jinny Blossom Supple, Pharm D- Clearance was already addressed 1/10 note to hold Eliquis for 2 days. ?- 12/16/21: note in her chart from General surgery stating ?Telephone Encounter - Berniece Pap, LPN - 48/25/0037 0:48 PM EST ?Formatting of this note might be different from the original. ?Pt is scheduled for Rob. Incisional hernia repair w/ mesh on 12/26/21 by Dr. Thermon Leyland  ? ?Janett Billow, [place] w/ [facility] called to report pt's surgery needs to be postponed until 01/02/22. Stated the pt just had a cardioversion d/t persistent A-fib on 12/02/21. Says the pt is to be on anticoagulants for 4wks. ? ?Informed Janett Billow that I would make Dr. Thermon Leyland and the surgery scheduler aware.  ? ?Today, the patient called the office stating "y'all need to quit messing up my life." ?She states she received a call last week the her surgery needed to be postponed as a call was received from cardiology to anesthesia that she needed to be on blood thinner for 4 weeks post DCCV. ?(4 weeks post  DCCV will be 12/30/21).  ? ?She then received a call from the surgeon's office today stating the same thing. ?I advised the patient that I do not see any documentation in her chart from cardiology stating this/ whom might have made that call.  ? 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  ?She states she does not know which doctor to believe at this time and needs this clarified TODAY as she has a pre-surgical COVID test pending.  ? ?I advised the patient I will forward to Dr. Agustin Cree and the nurse working him today to please clarify if she needs to be on blood thinner the full 4 weeks post DCCV and reach out to her today. ?She will be at the call back # listed until 4:30 pm today.  ? ? ? ?

## 2021-12-19 NOTE — Telephone Encounter (Signed)
Patient states her procedure was messed up, because of the office. Patient is very upset and states she must get a call back today as soon as possible.  ?

## 2021-12-19 NOTE — Telephone Encounter (Signed)
Mckaela, [place] - 12/19/2021 10:01 AM ?Park Liter, MD  ?Sent: Mon December 19, 2021  3:38 PM  ?To: Emily Filbert, RN  ? ? ?  ?   ? ?Message ? ?I tried to reach her and call her, however there was not she did not pick up.  She needs to be anticoagulated without interruption for at least 4 weeks after cardioversion.  If this is a life-threatening situation that we can interrupt anticoagulation however if we talk about elective surgery she need to wait 4 weeks after cardioversion of continuous anticoagulation  ? ?

## 2021-12-19 NOTE — Progress Notes (Signed)
Pt. Was called as per her request to clarify why her surgery has been postpone.RN gather the information before she called pt. As per notes,pt. Needs to be on blood thinners for four weeks after cardiac version. Pt. Was very upset,disappointed and frustrated due to the fact that she is on pain and that this,will affect her job schedule and time out from work. RN spend 15 minutes on the phone with the patient trying to explain the reasons of the changes on her surgery schedule.She did not want to hear any explanations and she said she will try to find who is creating all these disarrange on her life.Janet [name]. PA has been notify about this situation. ?

## 2021-12-19 NOTE — Progress Notes (Deleted)
Pt. Was called as per her request to clarify why her surgery has been postpone.RN gather the information before she called pt. As per notes,pt. Needs to be on blood thinners for four weeks after cardiac version. Pt. Was very upset,disappointed and frustrated due to the fact that she is on pain and that this,will affect her job schedule and time out from work. RN spend 15 minutes on the phone with the patient trying to explain the reasons of the changes on her surgery schedule.She did not want to hear any explanations and she said she will try to find who is creating all these disarrange on her life.Lenise Arena Ward. PA has been notify about this situation.

## 2021-12-19 NOTE — Telephone Encounter (Signed)
I called the patient  and explained that she needs to be anticoagulated without interruption for at least 4 weeks after cardioversion.  I also explained that if this is a life-threatening situation that we can interrupt anticoagulation however if we talk about elective surgery she need to wait 4 weeks after cardioversion of continuous anticoagulation per Dr. Wendy Poet recommendation. The patient was very upset and hung up the phone. ?

## 2021-12-20 ENCOUNTER — Telehealth: Payer: Self-pay

## 2021-12-20 NOTE — Telephone Encounter (Signed)
? ?  Pre-operative Risk Assessment  ?  ?Patient Name: Janet [name]  ?DOB: 06/14/1961 ?MRN: [id number]  ? ?  ? ?Request for Surgical Clearance   ? ?Procedure:   hernia repair ? ?Date of Surgery:  Clearance TBD                              ?   ?Surgeon:   ?Surgeon's Group or Practice Name:  [facility] ?Phone number:  [phone] ?Fax number:  [phone] Karl Bales, MA ?  ?Type of Clearance Requested:   ?- Pharmacy:  Hold Apixaban (Eliquis)   ?  ?Type of Anesthesia:  General  ?  ?Additional requests/questions:   ? ?Signed, ?Keagon Glascoe Tressa Busman   ?12/20/2021, 4:11 PM  ? ?

## 2021-12-21 NOTE — Telephone Encounter (Signed)
? ?  Primary Cardiologist: Jenean Lindau, MD ? ?Chart reviewed as part of pre-operative protocol coverage. Guidelines for [name] to hold Eliquis for upcoming surgery per clinical pharmacist review are as follows: ? ?Patient with diagnosis of A Fib on Eliquis for anticoagulation.   ?  ?Procedure: hernia repair ?Date of procedure: TBD ?  ?CHA2DS2-VASc Score = 2  ?This indicates a 2.2% annual risk of stroke. ?The patient's score is based upon: ?CHF History: 0 ?HTN History: 1 ?Diabetes History: 0 ?Stroke History: 0 ?Vascular Disease History: 0 ?Age Score: 0 ?Gender Score: 1 ?  ?CrCl 106 mL/min ?Platelet count 275K ?  ?Per office protocol, patient can hold Eliquis for 2 days prior to procedure.  ? ?I will route this recommendation to the requesting party via Epic fax function and remove from pre-op pool. ? ?Please call with questions. ? ?Emmaline Life, NP-C ? ?  ?12/21/2021, 10:11 AM ?[facility] ?[address] ?Office [phone] Fax [phone] ? ?

## 2021-12-21 NOTE — Telephone Encounter (Signed)
Patient with diagnosis of A Fib on Eliquis for anticoagulation.   ? ?Procedure: hernia repair ?Date of procedure: TBD ? ? ?CHA2DS2-VASc Score = 2  ?This indicates a 2.2% annual risk of stroke. ?The patient's score is based upon: ?CHF History: 0 ?HTN History: 1 ?Diabetes History: 0 ?Stroke History: 0 ?Vascular Disease History: 0 ?Age Score: 0 ?Gender Score: 1 ?  ?   ? ?CrCl 106 mL/min ?Platelet count 275K ? ?Per office protocol, patient can hold Eliquis for 2 days prior to procedure.   ?

## 2021-12-22 ENCOUNTER — Encounter (HOSPITAL_COMMUNITY): Payer: Managed Care, Other (non HMO)

## 2022-01-13 ENCOUNTER — Other Ambulatory Visit: Payer: Self-pay | Admitting: Cardiology

## 2022-01-13 NOTE — Telephone Encounter (Signed)
Preoperative team, please contact patient and let her know that her preoperative cardiac clearance has been submitted.  Recommendations for holding her Eliquis 2 days prior to her procedure were made.  Thank you. ? ?Jossie Ng. Dreyton Roessner NP-C ? ?  ?01/13/2022, 8:23 AM ?[facility] ?[address] ?Office [phone] Fax [phone] ? ?

## 2022-01-13 NOTE — Telephone Encounter (Signed)
Left detailed message ok per DPR. Left message she will hold Eliquis x 2 days prior to procedure. Will resume once performing surgeon feels safe from hemostasis.   ?

## 2022-01-13 NOTE — Telephone Encounter (Signed)
Patient states she has not heard back about whether she can hold her blood thinner. She states to call her back at: [phone].  ?

## 2022-01-13 NOTE — Telephone Encounter (Signed)
Left message for the pt to call back so that we may go over the medication recommendations hold time for her Eliquis. I will also send to requesting office.  ?

## 2022-01-17 ENCOUNTER — Ambulatory Visit: Payer: Managed Care, Other (non HMO) | Admitting: Cardiology

## 2022-01-25 NOTE — Progress Notes (Signed)
DUE TO COVID-19 ONLY  2 VISITOR IS ALLOWED TO COME WITH YOU AND STAY IN THE WAITING ROOM ONLY DURING PRE OP AND PROCEDURE DAY OF SURGERY.  24VISITOR  MAY VISIT WITH YOU AFTER SURGERY IN YOUR PRIVATE ROOM DURING VISITING HOURS ONLY! ?YOU MAY HAVE ONE PERSON SPEND THE NITE WITH YOU IN YOUR ROOM AFTER SURGERY.   ? ? Your procedure is scheduled on:  ?        01/31/2022  ? Report to [facility] Main  Entrance ? ? Report to admitting at   0515             AM ?DO NOT [place], PICTURE ID OR WALLET DAY OF SURGERY.  ?  ? ? Call this number if you have problems the morning of surgery [phone]  ? ? REMEMBER: NO  SOLID FOODS , CANDY, GUM OR MINTS AFTER MIDNITE THE NITE BEFORE SURGERY .       Marland Kitchen CLEAR LIQUIDS UNTIL      0415AM           DAY OF SURGERY.      PLEASE FINISH ENSURE DRINK PER SURGEON ORDER  WHICH NEEDS TO BE COMPLETED AT     0415AM     MORNING OF SURGERY.   ? ? ? ? ?CLEAR LIQUID DIET ? ? ?Foods Allowed      ?WATER ?BLACK COFFEE ( SUGAR OK, NO MILK, CREAM OR CREAMER) REGULAR AND DECAF  ?TEA ( SUGAR OK NO MILK, CREAM, OR CREAMER) REGULAR AND DECAF  ?PLAIN JELLO ( NO RED)  ?FRUIT ICES ( NO RED, NO FRUIT PULP)  ?POPSICLES ( NO RED)  ?JUICE- APPLE, WHITE GRAPE AND WHITE CRANBERRY  ?SPORT DRINK LIKE GATORADE ( NO RED)  ?CLEAR BROTH ( VEGETABLE , CHICKEN OR BEEF)                                                               ? ?    ? ?BRUSH YOUR TEETH MORNING OF SURGERY AND RINSE YOUR MOUTH OUT, NO CHEWING GUM CANDY OR MINTS. ?  ? ? Take these medicines the morning of surgery with A SIP OF WATER:  PROZAC, TOPROL  ? ? ?DO NOT TAKE ANY DIABETIC MEDICATIONS DAY OF YOUR SURGERY ?                  ?            You may not have any metal on your body including hair pins and  ?            piercings  Do not wear jewelry, make-up, lotions, powders or perfumes, deodorant ?            Do not wear nail polish on your fingernails.   ?           IF YOU ARE A FEMALE AND WANT TO SHAVE UNDER ARMS OR LEGS PRIOR TO  SURGERY YOU MUST DO SO AT LEAST 48 HOURS PRIOR TO SURGERY.  ?            Men may shave face and neck. ? ? Do not bring valuables to the hospital. [facility] NOT ?  RESPONSIBLE   FOR VALUABLES. ? Contacts, dentures or bridgework may not be worn into surgery. ? Leave suitcase in the car. After surgery it may be brought to your room. ? ?  ? Patients discharged the day of surgery will not be allowed to drive home. IF YOU ARE HAVING SURGERY AND GOING HOME THE SAME DAY, YOU MUST HAVE AN ADULT TO DRIVE YOU HOME AND BE WITH YOU FOR 24 HOURS. YOU MAY GO HOME BY TAXI OR UBER OR ORTHERWISE, BUT AN ADULT MUST ACCOMPANY YOU HOME AND STAY WITH YOU FOR 24 HOURS. ?  ? ?            Please read over the following fact sheets you were given: ?_____________________________________________________________________ ? ?[facility] - Preparing for Surgery ?Before surgery, you can play an important role.  Because skin is not sterile, your skin needs to be as free of germs as possible.  You can reduce the number of germs on your skin by washing with CHG (chlorahexidine gluconate) soap before surgery.  CHG is an antiseptic cleaner which kills germs and bonds with the skin to continue killing germs even after washing. ?Please DO NOT use if you have an allergy to CHG or antibacterial soaps.  If your skin becomes reddened/irritated stop using the CHG and inform your nurse when you arrive at Short Stay. ?Do not shave (including legs and underarms) for at least 48 hours prior to the first CHG shower.  You may shave your face/neck. ?Please follow these instructions carefully: ? 1.  Shower with CHG Soap the night before surgery and the  morning of Surgery. ? 2.  If you choose to wash your hair, wash your hair first as usual with your  normal  shampoo. ? 3.  After you shampoo, rinse your hair and body thoroughly to remove the  shampoo.                           4.  Use CHG as you would any other liquid soap.  You can apply chg directly   to the skin and wash  ?                     Gently with a scrungie or clean washcloth. ? 5.  Apply the CHG Soap to your body ONLY FROM THE NECK DOWN.   Do not use on face/ open      ?                     Wound or open sores. Avoid contact with eyes, ears mouth and genitals (private parts).  ?                     Production manager,  Genitals (private parts) with your normal soap. ?            6.  Wash thoroughly, paying special attention to the area where your surgery  will be performed. ? 7.  Thoroughly rinse your body with warm water from the neck down. ? 8.  DO NOT shower/wash with your normal soap after using and rinsing off  the CHG Soap. ?               9.  Pat yourself dry with a clean towel. ?           10.  Wear clean pajamas. ?  11.  Place clean sheets on your bed the night of your first shower and do not  sleep with pets. ?Day of Surgery : ?Do not apply any lotions/deodorants the morning of surgery.  Please wear clean clothes to the hospital/surgery center. ? ?FAILURE TO FOLLOW THESE INSTRUCTIONS MAY RESULT IN THE CANCELLATION OF YOUR SURGERY ?PATIENT SIGNATURE_________________________________ ? ?NURSE SIGNATURE__________________________________ ? ?________________________________________________________________________  ? ? ?           ?

## 2022-01-25 NOTE — Progress Notes (Addendum)
Anesthesia Review: ? ?PCP: Inda Coke   - [place]  ?Cardiologist : DR Agustin Cree LOV 11/10/21  ?Cardoilversion- 12/02/21  ?Chest x-ray : ?EKG : 12/02/21  and 01/27/22  ?Echo :07/01/21  ?Stress test:11/10/21  ?Monitor- -07/22/21  ?Cardiac Cath :  ?Activity level: can do a flgiht of stairs without difficulty  ?Sleep Study/ CPAP : none  ?Fasting Blood Sugar :      / Checks Blood Sugar -- times a day:   ?Blood Thinner/ Instructions /Last Dose: ?ASA / Instructions/ Last Dose :   ?Eliquis - instructions in 12/20/21 Encounter  ?PT states last dose on 01/27/2022  ?Pt's pulse at preop on dynamapp ranged from 38-100.  PT voices no complaints.  EKG done .  PT states she has not had followup since cardiolversion done on 12/02/21.  Made Pricilla Riffle aware of above .  PT instructed to call cardiology and let them be aware she is back in afib.  PT given copy of EKG to take with her along with rate of pulse from 38-100 on dynamapp.  PT instructed to follow Elliquis instructions given by MD preop for surgery. PT states she will go by cardiology office after leaving preop appt.  PT voiced understanding.    ?

## 2022-01-27 ENCOUNTER — Encounter (HOSPITAL_COMMUNITY): Payer: Self-pay

## 2022-01-27 ENCOUNTER — Other Ambulatory Visit: Payer: Self-pay

## 2022-01-27 ENCOUNTER — Encounter (HOSPITAL_COMMUNITY)
Admission: RE | Admit: 2022-01-27 | Discharge: 2022-01-27 | Disposition: A | Discharge status: Home / Self Care | Payer: Managed Care, Other (non HMO) | Source: Ambulatory Visit | Attending: Surgery | Admitting: Surgery

## 2022-01-27 VITALS — BP 140/88 | HR 80 | Temp 98.7°F | Resp 16 | Ht 65.5 in | Wt 361.0 lb

## 2022-01-27 DIAGNOSIS — I10 Essential (primary) hypertension: Secondary | ICD-10-CM | POA: Diagnosis not present

## 2022-01-27 DIAGNOSIS — Z853 Personal history of malignant neoplasm of breast: Secondary | ICD-10-CM | POA: Insufficient documentation

## 2022-01-27 DIAGNOSIS — I4891 Unspecified atrial fibrillation: Secondary | ICD-10-CM | POA: Insufficient documentation

## 2022-01-27 DIAGNOSIS — Z6841 Body Mass Index (BMI) 40.0 and over, adult: Secondary | ICD-10-CM | POA: Diagnosis not present

## 2022-01-27 DIAGNOSIS — K432 Incisional hernia without obstruction or gangrene: Secondary | ICD-10-CM | POA: Insufficient documentation

## 2022-01-27 DIAGNOSIS — Z01818 Encounter for other preprocedural examination: Secondary | ICD-10-CM | POA: Insufficient documentation

## 2022-01-27 HISTORY — DX: Gastro-esophageal reflux disease without esophagitis: K21.9

## 2022-01-27 LAB — BASIC METABOLIC PANEL
Anion gap: 7 (ref 5–15)
BUN: 27 mg/dL — ABNORMAL HIGH (ref 6–20)
CO2: 31 mmol/L (ref 22–32)
Calcium: 9 mg/dL (ref 8.9–10.3)
Chloride: 102 mmol/L (ref 98–111)
Creatinine, Ser: 1.08 mg/dL — ABNORMAL HIGH (ref 0.44–1.00)
GFR, Estimated: 59 mL/min — ABNORMAL LOW (ref >60)
Glucose, Bld: 108 mg/dL — ABNORMAL HIGH (ref 70–99)
Potassium: 3.7 mmol/L (ref 3.5–5.1)
Sodium: 140 mmol/L (ref 135–145)

## 2022-01-27 LAB — CBC
HCT: 43.8 % (ref 36.0–46.0)
Hemoglobin: 14.7 g/dL (ref 12.0–15.0)
MCH: 31.9 pg (ref 26.0–34.0)
MCHC: 33.6 g/dL (ref 30.0–36.0)
MCV: 95 fL (ref 80.0–100.0)
Platelets: 264 10*3/uL (ref 150–400)
RBC: 4.61 MIL/uL (ref 3.87–5.11)
RDW: 13.8 % (ref 11.5–15.5)
WBC: 10.3 10*3/uL (ref 4.0–10.5)
nRBC: 0 % (ref 0.0–0.2)

## 2022-01-30 ENCOUNTER — Telehealth: Payer: Self-pay

## 2022-01-30 NOTE — Progress Notes (Addendum)
Anesthesia Chart Review ? ? Case: [id number] Date/Time: 01/31/22 0700  ? Procedure: robotic incisional hernia repair with mesh  ? Anesthesia type: General  ? Pre-op diagnosis: incisional hernia  ? [facility]  ? Surgeons: Felicie Morn, MD  ? ?  ? ? ?DISCUSSION:61 y.o. never smoker with h/o PONV, HTN, left breast cancer, a-fib, incisional hernia scheduled for above procedure 01/31/2022 with Dr. Louanna Raw.  ? ?Pt s/p cardioversion 12/02/21.  Pt in A-fib with rate of 105 bpm at PAT visit on 01/27/2022.  ? ?Discussed with cardiologist, Dr. Agustin Cree.  Pt needs to be seen in office before proceeding with surgery.  Spoke with [facility] triage nurse who will inform Dr. Thermon Leyland, surgery scheduler, and patient.  ?VS: BP 140/88   Pulse 80   Temp 37.1 ?C (Oral)   Resp 16   Ht 5' 5.5" (1.664 m)   Wt (!) 163.7 kg   LMP 03/15/2012   SpO2 98%   BMI 59.16 kg/m?  ? ?PROVIDERS: ?Inda Coke, PA is PCP  ?Primary Cardiologist: Jenean Lindau, MD ? ?LABS: Labs reviewed: Acceptable for surgery. ?(all labs ordered are listed, but only abnormal results are displayed) ? ?Labs Reviewed  ?BASIC METABOLIC PANEL - Abnormal; Notable for the following components:  ?    Result Value  ? Glucose, Bld 108 (*)   ? BUN 27 (*)   ? Creatinine, Ser 1.08 (*)   ? GFR, Estimated 59 (*)   ? All other components within normal limits  ?CBC  ? ? ? ?IMAGES: ? ? ?EKG: ? ? ?CV: ?Myocardial Perfusion 11/10/2021 ?  Findings are consistent with no prior ischemia. The study is intermediate risk. ?  Left ventricular function is abnormal. Nuclear stress EF: 46 %. The left ventricular ejection fraction is mildly decreased (45-54%). End diastolic cavity size is mildly enlarged. ?  Atrial fibrillation with elevated rate was noted. ?  ?Echo 07/01/2021 ?1. Left ventricular ejection fraction, by estimation, is 55 to 60%. The  ?left ventricle has normal function. The left ventricle has no regional  ?wall motion abnormalities. There is  moderate concentric left ventricular  ?hypertrophy. Left ventricular  ?diastolic parameters are consistent with Grade II diastolic dysfunction  ?(pseudonormalization).  ? 2. Right ventricular systolic function is normal. The right ventricular  ?size is normal. There is mildly elevated pulmonary artery systolic  ?pressure.  ? 3. Left atrial size was mildly dilated.  ? 4. The mitral valve is normal in structure. No evidence of mitral valve  ?regurgitation. No evidence of mitral stenosis.  ? 5. The aortic valve is normal in structure. Aortic valve regurgitation is  ?not visualized. Mild aortic valve sclerosis is present, with no evidence  ?of aortic valve stenosis.  ? 6. Abdominal aorta is dilated (2.2cm). Aneurysm of the ascending aorta,  ?measuring 46 mm.  ? 7. The inferior vena cava is normal in size with greater than 50%  ?respiratory variability, suggesting right atrial pressure of 3 mmHg.  ?Past Medical History:  ?Diagnosis Date  ? Anemia   ? history of anemia from AUB  ? Anxiety   ? Arthritis   ? Breast cancer ([place]) 10/16/2010  ? left breast/  NO CHEMO  ? Complication of anesthesia   ? difficult IV access- never needed PICC line  ? Depression   ? no meds  ? Dysrhythmia   ? Edema of both legs   ? GERD (gastroesophageal reflux disease)   ? Hypertension   ? ekg  1/13 on chart  ? Pneumonia 10/17/1987  ? hx  ? PONV (postoperative nausea and vomiting)   ? Umbilical hernia   ? currently - not repaired yet  ? ? ?Past Surgical History:  ?Procedure Laterality Date  ? ABDOMINAL HYSTERECTOMY  04/30/2012  ? Procedure: HYSTERECTOMY ABDOMINAL;  Surgeon: Imagene Gurney A. Alycia Rossetti, MD;  [facility];  Service: Gynecology;  Laterality: N/A;  ? ABLATION  2012  ? BREAST CAPSULECTOMY WITH IMPLANT EXCHANGE  10/02/2012  ? Procedure: BREAST CAPSULECTOMY WITH IMPLANT EXCHANGE;  Surgeon: Theodoro Kos, DO;  Location: [place];  Service: Plastics;  Laterality: Left;   LEFT EXPANDER REMOVAL WITH CAPSULECTOMY AND PLACEMENT OF  IMPLANT  ? BREAST IMPLANT EXCHANGE Left 01/20/2013  ? Procedure: REVISION OF LEFT BREAST  IMPLANT ;  Surgeon: Theodoro Kos, DO;  Location: [place];  Service: Plastics;  Laterality: Left;  ? BREAST REDUCTION SURGERY N/A 01/20/2013  ? Procedure: Abdominal fat graft to breast reconstruction area;  Surgeon: Theodoro Kos, DO;  Location: [place];  Service: Plastics;  Laterality: N/A;  ? BREAST SURGERY  10/16/2010  ? lt mastectomy/ axillary dissection  ? BREAST SURGERY  10/16/2010  ? tissue expander lt  ? CARDIOVERSION N/A 12/02/2021  ? Procedure: CARDIOVERSION;  Surgeon: Pixie Casino, MD;  Location: [facility] END[facility]COPY;  Service: Cardiovascular;  Laterality: N/A;  ? COLONOSCOPY    ? DILATION AND CURETTAGE OF UTERUS  01/26/2012  ? Procedure: DILATATION AND CURETTAGE;  Surgeon: Lahoma Crocker, MD;  Location: [place] [facility];  Service: Gynecology;;  ? ESOPHAGOGASTRODUODENOSCOPY    ? HERNIA REPAIR    ? LAPAROTOMY  04/30/2012  ? Procedure: EXPLORATORY LAPAROTOMY;  Surgeon: Lucita Lora. Alycia Rossetti, MD;  [facility];  Service: Gynecology;  Laterality: N/A;  ? MASTECTOMY    ? Left side  ? MASTOPEXY  10/02/2012  ? Procedure: MASTOPEXY;  Surgeon: Theodoro Kos, DO;  Location: [place];  Service: Plastics;  Laterality: Right;  RIGHT BREAST MASTOPEXY REDUCTION FOR SYMMETRY,  ? MULTIPLE EXTRACTIONS WITH ALVEOLOPLASTY  10/23/2011  ? MULTIPLE TOOTH EXTRACTIONS    ? NOVASURE ABLATION  03/01/2012  ? RECONSTRUCTION BREAST IMMEDIATE / DELAYED W/ TISSUE EXPANDER    ? tissue expander lt-9/12  ? SALPINGOOPHORECTOMY  04/30/2012  ? Procedure: SALPINGO OOPHERECTOMY;  Surgeon: Imagene Gurney A. Alycia Rossetti, MD;  [facility];  Service: Gynecology;  Laterality: Bilateral;  ? TOE OSTEOTOMY    ? rt toe  ? VENTRAL HERNIA REPAIR  04/30/2012  ? Procedure: HERNIA REPAIR VENTRAL ADULT;  Surgeon: Imagene Gurney A. Alycia Rossetti, MD;  [facility];  Service: Gynecology;  Laterality: N/A;  ? ? ?MEDICATIONS: ? apixaban (ELIQUIS) 5 MG TABS tablet  ? Calcium Carb-Cholecalciferol  (CALCIUM 500/D PO)  ? FLUoxetine (PROZAC) 10 MG tablet  ? lisinopril (ZESTRIL) 20 MG tablet  ? metolazone (ZAROXOLYN) 2.5 MG tablet  ? metoprolol succinate (TOPROL-XL) 50 MG 24 hr tablet  ? Misc Natural Products (TART CHERRY ADVANCED) CAPS  ? Multiple Vitamin (MULTIVITAMIN WITH MINERALS) TABS  ? Potassium 99 MG TABS  ? Turmeric 500 MG CAPS  ? vitamin B-12 (CYANOCOBALAMIN) 100 MCG tablet  ? vitamin C (ASCORBIC ACID) 500 MG tablet  ? ?No current facility-administered medications for this encounter.  ? ? ?Konrad Felix Ward, PA-C ?[facility] ?[phone] ? ? ? ? ? ? ?

## 2022-01-30 NOTE — Telephone Encounter (Signed)
LVM per Dr. Wendy Poet note that she needs to be seen in the next 2 weeks before having surgery. ?

## 2022-01-30 NOTE — Telephone Encounter (Signed)
-----   Message from Park Liter, MD sent at 01/30/2022  9:23 AM EDT ----- ?She is to have follow-up appointment she is back in atrial fibrillation, schedule her to see me within the next 2 weeks or so ? ?

## 2022-01-30 NOTE — Telephone Encounter (Signed)
Pt came into the office very irrate and upset as she has been notified that her surgery was cancelled as she has not had a FU regarding her cardioversion. When I explained that we had called she screamed saying you only let it ring once and then you hung up. I explained that it rang several times before I hung up and attempted to call Ena Dawley with the surgeons office who had call and I left them a message. I also explained that another nurse had called and left her a message to help get her scheduled. Pt goes onto state that we never follow up with her messages when she calls she leaves a message for someone to callback so she will not lose her place in line and never gets a callback. Pt states she had a FU appt scheduled 01/17/22 and that appt was cancelled. Pt said she got a message regarding the cancellation. Pt is very disgruntled and states the only way I could help her is to let her have her surgery tomorrow. ?

## 2022-01-30 NOTE — Telephone Encounter (Signed)
Noel with [facility] is following up on behalf of the patient. She states she spoke with the patient regarding her needing an appointment with Dr. Agustin Cree within the next 2 weeks, but the patient became very upset on the phone because her procedure has already been rescheduled with them a few times. Ena Dawley states patient's procedure was scheduled for 4/18 (see 3/07 phone encounter). Ena Dawley was unable to schedule an appointment for the patient, but requested that someone contact the patient ASAP to have her worked in, if possible. ?

## 2022-01-30 NOTE — Telephone Encounter (Signed)
LVM for Janet [name] to callback. ?

## 2022-01-31 ENCOUNTER — Ambulatory Visit (HOSPITAL_COMMUNITY): Admission: RE | Admit: 2022-01-31 | Payer: Managed Care, Other (non HMO) | Source: Home / Self Care | Admitting: Surgery

## 2022-01-31 ENCOUNTER — Encounter (HOSPITAL_COMMUNITY): Admission: RE | Payer: Self-pay | Source: Home / Self Care

## 2022-01-31 SURGERY — REPAIR, HERNIA, VENTRAL, ROBOT-ASSISTED
Anesthesia: General

## 2022-03-10 ENCOUNTER — Other Ambulatory Visit: Payer: Self-pay | Admitting: Physician Assistant

## 2022-03-20 NOTE — Progress Notes (Signed)
Surgery orders requested with Claiborne Billings at Dr. Robby Sermon office.

## 2022-03-20 NOTE — Progress Notes (Signed)
COVID Vaccine Completed:  Date of COVID positive in last 90 days:  PCP - Inda Coke, PA Cardiologist - Milana Huntsman, MD  Cardiac clearance in [facility] dated 02-23-22 [facility]  Chest x-ray -  EKG - 01-27-22 Epic Stress Test - 11-10-21 Epic ECHO - 07-01-21 Epic Cardiac Cath -  Pacemaker/ICD device last checked: Spinal Cord Stimulator: Long Term Monitor - 2022 Epic  Bowel Prep -   Sleep Study -  CPAP -   Fasting Blood Sugar -  Checks Blood Sugar _____ times a day  Blood Thinner Instructions:  Eliquis.  Hold 2 days prior Aspirin Instructions: Last Dose:  Activity level:  Can go up a flight of stairs and perform activities of daily living without stopping and without symptoms of chest pain or shortness of breath.  Able to exercise without symptoms  Unable to go up a flight of stairs without symptoms of     Anesthesia review:  Afib, AAA, HTN  Patient denies shortness of breath, fever, cough and chest pain at PAT appointment  Patient verbalized understanding of instructions that were given to them at the PAT appointment. Patient was also instructed that they will need to review over the PAT instructions again at home before surgery.

## 2022-03-20 NOTE — Patient Instructions (Addendum)
DUE TO SPACE LIMITATIONS, ONLY TWO VISITORS  (aged 60 and older) ARE ALLOWED TO COME WITH YOU AND STAY IN THE WAITING ROOM DURING YOUR PRE OP AND PROCEDURE.   **NO VISITORS ARE ALLOWED IN THE SHORT STAY AREA OR RECOVERY ROOM!!**  IF YOU WILL BE ADMITTED INTO THE HOSPITAL YOU ARE ALLOWED ONLY FOUR SUPPORT PEOPLE DURING VISITATION HOURS (7 AM -8PM)   The support person(s) must pass our screening, and use Hand sanitizing gel. Visitors GUEST BADGE MUST BE WORN VISIBLY  One adult visitor may remain with you overnight and MUST be in the room by 8 P.M.   You are not required to quarantine at this time prior to your surgery. However, you must do this: Hand Hygiene often Do NOT share personal items Notify your provider if you are in close contact with someone who has COVID or you develop fever 100.4 or greater, new onset of sneezing, cough, sore throat, shortness of breath or body aches.       Your procedure is scheduled on:  03-27-22  Report to [facility] Main Entrance   Report to admitting at:  5:15 AM  +++++Call this number if you have any questions or problems the morning of surgery [phone]  Do not eat food :After Midnight the night prior to your surgery/procedure.  After Midnight you may have the following liquids until 4:30 AM DAY OF SURGERY  Clear Liquid Diet Water Black Coffee (sugar ok, NO MILK/CREAM OR CREAMERS)  Tea (sugar ok, NO MILK/CREAM OR CREAMERS) regular and decaf                             Plain Jell-O (NO RED)                                           Fruit ices (not with fruit pulp, NO RED)                                     Popsicles (NO RED)                                                                  Juice: apple, WHITE grape, WHITE cranberry Sports drinks like Gatorade (NO RED) Clear broth(vegetable,chicken,beef)                  The day of surgery:  Drink ONE (1) Pre-Surgery Clear Ensure at 4:30 AM the morning of surgery. Drink in one  sitting. Do not sip.  This drink was given to you during your hospital  pre-op appointment visit. Nothing else to drink after completing the Pre-Surgery Clear Ensure    If you have questions, please contact your surgeon's office.   FOLLOW BOWEL PREP AND ANY ADDITIONAL PRE OP INSTRUCTIONS YOU RECEIVED FROM YOUR SURGEON'S OFFICE!!!   Oral Hygiene is also important to reduce your risk of infection.        Remember - BRUSH YOUR TEETH THE MORNING OF SURGERY WITH YOUR REGULAR TOOTHPASTE  Do NOT smoke after Midnight  the night before surgery.  Take ONLY these medicines the morning of surgery with A SIP OF WATER:  Metoprolol.  Okay to take Tylenol if needed                    You may not have any metal on your body including hair pins, jewelry, and body piercing  Do not wear make-up, lotions, powders, perfumes or deodorant  Do not wear nail polish including gel and S&S, artificial/acrylic nails, or any other type of covering on natural nails including finger and toenails. If you have artificial nails, gel coating, etc. that needs to be removed by a nail salon, please have this removed prior to surgery. Not doing so may mean that your surgery could be canceled or delayed if the surgeon or anesthesia feels like they are unable to monitor you safely.   Do not shave 48 hours prior to surgery to avoid nicks in your skin which may contribute to postoperative infections.   Contacts, Hearing Aids, dentures or bridgework may not be worn into surgery.   You may bring a small overnight bag with you on the day of surgery, only pack items that are not valuable .[facility] IS NOT RESPONSIBLE   FOR VALUABLES THAT ARE LOST OR STOLEN.    Special Instructions: Bring a copy of your healthcare power of attorney and living will documents the day of surgery, if you wish to have them scanned into your [facility] Records- EPIC  Please read over the following fact sheets you were given: IF YOU HAVE QUESTIONS  ABOUT YOUR PRE-OP INSTRUCTIONS, PLEASE CALL [facility] - Preparing for Surgery Before surgery, you can play an important role.  Because skin is not sterile, your skin needs to be as free of germs as possible.  You can reduce the number of germs on your skin by washing with CHG (chlorahexidine gluconate) soap before surgery.  CHG is an antiseptic cleaner which kills germs and bonds with the skin to continue killing germs even after washing. Please DO NOT use if you have an allergy to CHG or antibacterial soaps.  If your skin becomes reddened/irritated stop using the CHG and inform your nurse when you arrive at Short Stay. Do not shave (including legs and underarms) for at least 48 hours prior to the first CHG shower.  You may shave your face/neck.  Please follow these instructions carefully:  1.  Shower with CHG Soap the night before surgery and the  morning of surgery.  2.  If you choose to wash your hair, wash your hair first as usual with your normal  shampoo.  3.  After you shampoo, rinse your hair and body thoroughly to remove the shampoo.                             4.  Use CHG as you would any other liquid soap.  You can apply chg directly to the skin and wash.  Gently with a scrungie or clean washcloth.  5.  Apply the CHG Soap to your body ONLY FROM THE NECK DOWN.   Do   not use on face/ open                           Wound or open sores. Avoid contact with eyes, ears mouth and   genitals (private parts).  Wash face,  Genitals (private parts) with your normal soap.             6.  Wash thoroughly, paying special attention to the area where your    surgery  will be performed.  7.  Thoroughly rinse your body with warm water from the neck down.  8.  DO NOT shower/wash with your normal soap after using and rinsing off the CHG Soap.            9.  Pat yourself dry with a clean towel.            10.  Wear clean pajamas.            11.  Place clean sheets  on your bed the night of your first shower and do not  sleep with pets.  ON THE DAY OF SURGERY : Do not apply any lotions/deodorants the morning of surgery.  Please wear clean clothes to the hospital/surgery center.    FAILURE TO FOLLOW THESE INSTRUCTIONS MAY RESULT IN THE CANCELLATION OF YOUR SURGERY  PATIENT SIGNATURE_________________________________  NURSE SIGNATURE__________________________________  ________________________________________________________________________

## 2022-03-21 ENCOUNTER — Encounter (HOSPITAL_COMMUNITY)
Admission: RE | Admit: 2022-03-21 | Discharge: 2022-03-21 | Disposition: A | Discharge status: Home / Self Care | Payer: Managed Care, Other (non HMO) | Source: Ambulatory Visit | Attending: Surgery | Admitting: Surgery

## 2022-03-21 ENCOUNTER — Encounter (HOSPITAL_COMMUNITY): Payer: Self-pay

## 2022-03-21 ENCOUNTER — Other Ambulatory Visit: Payer: Self-pay

## 2022-03-21 VITALS — BP 128/84 | HR 84 | Temp 98.3°F | Resp 20 | Ht 65.0 in | Wt 368.0 lb

## 2022-03-21 DIAGNOSIS — I2510 Atherosclerotic heart disease of native coronary artery without angina pectoris: Secondary | ICD-10-CM | POA: Insufficient documentation

## 2022-03-21 DIAGNOSIS — K432 Incisional hernia without obstruction or gangrene: Secondary | ICD-10-CM | POA: Insufficient documentation

## 2022-03-21 DIAGNOSIS — E669 Obesity, unspecified: Secondary | ICD-10-CM | POA: Diagnosis not present

## 2022-03-21 DIAGNOSIS — I10 Essential (primary) hypertension: Secondary | ICD-10-CM | POA: Diagnosis not present

## 2022-03-21 DIAGNOSIS — I4891 Unspecified atrial fibrillation: Secondary | ICD-10-CM | POA: Diagnosis not present

## 2022-03-21 DIAGNOSIS — D649 Anemia, unspecified: Secondary | ICD-10-CM | POA: Insufficient documentation

## 2022-03-21 DIAGNOSIS — Z6841 Body Mass Index (BMI) 40.0 and over, adult: Secondary | ICD-10-CM | POA: Diagnosis not present

## 2022-03-21 DIAGNOSIS — Z01812 Encounter for preprocedural laboratory examination: Secondary | ICD-10-CM | POA: Diagnosis not present

## 2022-03-21 HISTORY — DX: Aneurysm of the ascending aorta, without rupture: I71.21

## 2022-03-21 LAB — CBC
HCT: 43.1 % (ref 36.0–46.0)
Hemoglobin: 14.7 g/dL (ref 12.0–15.0)
MCH: 32.7 pg (ref 26.0–34.0)
MCHC: 34.1 g/dL (ref 30.0–36.0)
MCV: 96 fL (ref 80.0–100.0)
Platelets: 275 10*3/uL (ref 150–400)
RBC: 4.49 MIL/uL (ref 3.87–5.11)
RDW: 13.9 % (ref 11.5–15.5)
WBC: 9.3 10*3/uL (ref 4.0–10.5)
nRBC: 0 % (ref 0.0–0.2)

## 2022-03-21 LAB — BASIC METABOLIC PANEL
Anion gap: 10 (ref 5–15)
BUN: 18 mg/dL (ref 6–20)
CO2: 29 mmol/L (ref 22–32)
Calcium: 9.2 mg/dL (ref 8.9–10.3)
Chloride: 100 mmol/L (ref 98–111)
Creatinine, Ser: 0.98 mg/dL (ref 0.44–1.00)
GFR, Estimated: >60 mL/min (ref >60)
Glucose, Bld: 109 mg/dL — ABNORMAL HIGH (ref 70–99)
Potassium: 3.9 mmol/L (ref 3.5–5.1)
Sodium: 139 mmol/L (ref 135–145)

## 2022-03-21 NOTE — Progress Notes (Signed)
Spoke to portable equipment and requested Bari Bed for surgery on 03-27-22

## 2022-03-22 ENCOUNTER — Encounter (HOSPITAL_COMMUNITY): Payer: Self-pay

## 2022-03-22 ENCOUNTER — Ambulatory Visit: Payer: Self-pay | Admitting: Surgery

## 2022-03-22 NOTE — Progress Notes (Signed)
Anesthesia Chart Review:   Case: [id number] Date/Time: 03/27/22 0715   Procedure: XI ROBOTIC ASSISTED INCISIONAL HERNIA REPAIR WITH MESH   Anesthesia type: General   Pre-op diagnosis: INCISIONAL HERNIA   [facility]   Surgeons: Felicie Morn, MD       DISCUSSION: Pt is 60 years old with hx atrial fibrillation (s/p cardioversion 12/02/21) ascending aorta aneurysm (30m by 07/01/21), HTN  Pt to hold eliquis 2 days before surgery  VS: BP 128/84   Pulse 84   Temp 36.8 C (Oral)   Resp 20   Ht '5\' 5"'$  (1.651 m)   Wt (!) 166.9 kg   LMP 03/15/2012   SpO2 98%   BMI 61.24 kg/m   PROVIDERS: - [place]- Cardiologist is ZMilana Huntsman MD who cleared pt for surgery at intermediate to high risk for adverse cardiac event due to obesity. at last office visit 02/23/22 (notes in care everywhere)   LABS: Labs reviewed: Acceptable for surgery. (all labs ordered are listed, but only abnormal results are displayed)  Labs Reviewed  BASIC METABOLIC PANEL - Abnormal; Notable for the following components:      Result Value   Glucose, Bld 109 (*)    All other components within normal limits  CBC    EKG 01/27/22: Atrial fibrillation with rapid ventricular response. HR 105. Cannot rule out Anterior infarct , age undetermined   CV: Myocardial Perfusion 11/10/2021   Findings are consistent with no prior ischemia. The study is intermediate risk.   Left ventricular function is abnormal. Nuclear stress EF: 46 %. The left ventricular ejection fraction is mildly decreased (45-54%). End diastolic cavity size is mildly enlarged.   Atrial fibrillation with elevated rate was noted.  Echo 07/01/2021 1. Left ventricular ejection fraction, by estimation, is 55 to 60%. The left ventricle has normal function. The left ventricle has no regional wall motion abnormalities. There is moderate concentric left ventricular hypertrophy. Left ventricular diastolic parameters  are consistent with Grade II diastolic dysfunction (pseudonormalization).  2. Right ventricular systolic function is normal. The right ventricular size is normal. There is mildly elevated pulmonary artery systolic pressure.  3. Left atrial size was mildly dilated.  4. The mitral valve is normal in structure. No evidence of mitral valve regurgitation. No evidence of mitral stenosis.  5. The aortic valve is normal in structure. Aortic valve regurgitation is not visualized. Mild aortic valve sclerosis is present, with no evidence of aortic valve stenosis.  6. Abdominal aorta is dilated (2.2cm). Aneurysm of the ascending aorta, measuring 46 mm.  7. The inferior vena cava is normal in size with greater than 50% respiratory variability, suggesting right atrial pressure of 3 mmHg.   Past Medical History:  Diagnosis Date   Anemia    history of anemia from AUB   Anxiety    Arthritis    [place]    477mon 07/01/21 echo   [place]   left breast/  NO CHEMO   Complication of anesthesia    difficult IV access- never needed PICC line   Depression    no meds   Dysrhythmia    Edema of both legs    GERD (gastroesophageal reflux disease)    Hypertension    ekg 1/13 on chart   Pneumonia 10/17/1987   hx   PONV (postoperative nausea and vomiting)    Umbilical hernia    currently - not repaired yet  Past Surgical History:  Procedure Laterality Date   ABDOMINAL HYSTERECTOMY  04/30/2012   Procedure: HYSTERECTOMY ABDOMINAL;  Surgeon: Imagene Gurney A. Alycia Rossetti, MD;  [facility];  Service: Gynecology;  Laterality: N/A;   ABLATION  2012   BREAST CAPSULECTOMY WITH IMPLANT EXCHANGE  10/02/2012   Procedure: BREAST CAPSULECTOMY WITH IMPLANT EXCHANGE;  Surgeon: Theodoro Kos, DO;  Location: [place];  Service: Plastics;  Laterality: Left;   LEFT EXPANDER REMOVAL WITH CAPSULECTOMY AND PLACEMENT OF IMPLANT   BREAST IMPLANT EXCHANGE Left 01/20/2013    Procedure: REVISION OF LEFT BREAST  IMPLANT ;  Surgeon: Theodoro Kos, DO;  Location: [place];  Service: Plastics;  Laterality: Left;   BREAST REDUCTION SURGERY N/A 01/20/2013   Procedure: Abdominal fat graft to breast reconstruction area;  Surgeon: Theodoro Kos, DO;  Location: [place];  Service: Plastics;  Laterality: N/A;   BREAST SURGERY  10/16/2010   lt mastectomy/ axillary dissection   BREAST SURGERY  10/16/2010   tissue expander lt   CARDIOVERSION N/A 12/02/2021   Procedure: CARDIOVERSION;  Surgeon: Pixie Casino, MD;  Location: [place];  Service: Cardiovascular;  Laterality: N/A;   COLONOSCOPY     DILATION AND CURETTAGE OF UTERUS  01/26/2012   Procedure: DILATATION AND CURETTAGE;  Surgeon: Lahoma Crocker, MD;  Location: [place] [facility];  Service: Gynecology;;   ESOPHAGOGASTRODUODENOSCOPY     HERNIA REPAIR     LAPAROTOMY  04/30/2012   Procedure: EXPLORATORY LAPAROTOMY;  Surgeon: Imagene Gurney A. Alycia Rossetti, MD;  [facility];  Service: Gynecology;  Laterality: N/A;   MASTECTOMY     Left side   MASTOPEXY  10/02/2012   Procedure: MASTOPEXY;  Surgeon: Theodoro Kos, DO;  Location: [place];  Service: Plastics;  Laterality: Right;  RIGHT BREAST MASTOPEXY REDUCTION FOR SYMMETRY,   MULTIPLE EXTRACTIONS WITH ALVEOLOPLASTY  10/23/2011   MULTIPLE TOOTH EXTRACTIONS     NOVASURE ABLATION  03/01/2012   RECONSTRUCTION BREAST IMMEDIATE / DELAYED W/ TISSUE EXPANDER     tissue expander lt-9/12   SALPINGOOPHORECTOMY  04/30/2012   Procedure: SALPINGO OOPHERECTOMY;  Surgeon: Imagene Gurney A. Alycia Rossetti, MD;  [facility];  Service: Gynecology;  Laterality: Bilateral;   TOE OSTEOTOMY     rt toe   VENTRAL HERNIA REPAIR  04/30/2012   Procedure: HERNIA REPAIR VENTRAL ADULT;  Surgeon: Imagene Gurney A. Alycia Rossetti, MD;  [facility];  Service: Gynecology;  Laterality: N/A;    MEDICATIONS:  acetaminophen (TYLENOL) 500 MG tablet   apixaban (ELIQUIS) 5 MG TABS tablet   Calcium Carb-Cholecalciferol (CALCIUM  500/D PO)   Cholecalciferol (VITAMIN D3 PO)   FLUoxetine (PROZAC) 20 MG capsule   lisinopril (ZESTRIL) 20 MG tablet   metolazone (ZAROXOLYN) 2.5 MG tablet   metoprolol succinate (TOPROL-XL) 50 MG 24 hr tablet   Misc Natural Products (TART CHERRY ADVANCED) CAPS   Multiple Vitamin (MULTIVITAMIN WITH MINERALS) TABS   Potassium Gluconate (SM POTASSIUM PO)   Turmeric 500 MG CAPS   vitamin B-12 (CYANOCOBALAMIN) 500 MCG tablet   vitamin C (ASCORBIC ACID) 500 MG tablet   No current facility-administered medications for this encounter.    If no changes, I anticipate pt can proceed with surgery as scheduled.   Willeen Cass, PhD, FNP-BC [facility] Short Stay Surgical Center/Anesthesiology Phone: [phone] 03/22/2022 11:43 AM

## 2022-03-22 NOTE — Anesthesia Preprocedure Evaluation (Addendum)
Anesthesia Evaluation  Patient identified by MRN, date of birth, ID band Patient awake    History of Anesthesia Complications (+) PONV and history of anesthetic complications  Airway Mallampati: II  TM Distance: >3 FB     Dental   Pulmonary pneumonia,    breath sounds clear to auscultation       Cardiovascular hypertension, + DOE  + dysrhythmias  Rhythm:Regular Rate:Normal     Neuro/Psych PSYCHIATRIC DISORDERS    GI/Hepatic Neg liver ROS, GERD  ,  Endo/Other  negative endocrine ROS  Renal/GU negative Renal ROS     Musculoskeletal  (+) Arthritis ,   Abdominal   Peds  Hematology   Anesthesia Other Findings   Reproductive/Obstetrics                           Anesthesia Physical Anesthesia Plan  ASA: 3  Anesthesia Plan: General   Post-op Pain Management:    Induction:   PONV Risk Score and Plan: 4 or greater and Ondansetron, Dexamethasone and Midazolam  Airway Management Planned: Oral ETT  Additional Equipment:   Intra-op Plan:   Post-operative Plan: Possible Post-op intubation/ventilation  Informed Consent:     Dental advisory given  Plan Discussed with: Anesthesiologist and CRNA  Anesthesia Plan Comments: (See APP note by Durel Salts, FNP )      Anesthesia Quick Evaluation

## 2022-03-27 ENCOUNTER — Other Ambulatory Visit: Payer: Self-pay

## 2022-03-27 ENCOUNTER — Ambulatory Visit (HOSPITAL_BASED_OUTPATIENT_CLINIC_OR_DEPARTMENT_OTHER): Payer: Managed Care, Other (non HMO) | Admitting: Anesthesiology

## 2022-03-27 ENCOUNTER — Encounter (HOSPITAL_COMMUNITY): Payer: Self-pay | Admitting: Surgery

## 2022-03-27 ENCOUNTER — Encounter (HOSPITAL_COMMUNITY)
Admission: RE | Disposition: A | Discharge status: Home / Self Care | Payer: Self-pay | Source: Ambulatory Visit | Attending: Surgery

## 2022-03-27 ENCOUNTER — Observation Stay (HOSPITAL_COMMUNITY)
Admission: RE | Admit: 2022-03-27 | Discharge: 2022-03-29 | Disposition: A | Discharge status: Home / Self Care | Payer: Managed Care, Other (non HMO) | Source: Ambulatory Visit | Attending: Surgery | Admitting: Surgery

## 2022-03-27 ENCOUNTER — Ambulatory Visit (HOSPITAL_COMMUNITY): Payer: Managed Care, Other (non HMO) | Admitting: Emergency Medicine

## 2022-03-27 DIAGNOSIS — I10 Essential (primary) hypertension: Secondary | ICD-10-CM | POA: Diagnosis not present

## 2022-03-27 DIAGNOSIS — I480 Paroxysmal atrial fibrillation: Secondary | ICD-10-CM | POA: Diagnosis not present

## 2022-03-27 DIAGNOSIS — K439 Ventral hernia without obstruction or gangrene: Secondary | ICD-10-CM | POA: Diagnosis not present

## 2022-03-27 DIAGNOSIS — K430 Incisional hernia with obstruction, without gangrene: Secondary | ICD-10-CM

## 2022-03-27 DIAGNOSIS — Z7901 Long term (current) use of anticoagulants: Secondary | ICD-10-CM | POA: Insufficient documentation

## 2022-03-27 DIAGNOSIS — Z79899 Other long term (current) drug therapy: Secondary | ICD-10-CM | POA: Insufficient documentation

## 2022-03-27 DIAGNOSIS — Z853 Personal history of malignant neoplasm of breast: Secondary | ICD-10-CM | POA: Insufficient documentation

## 2022-03-27 DIAGNOSIS — J189 Pneumonia, unspecified organism: Secondary | ICD-10-CM

## 2022-03-27 DIAGNOSIS — K429 Umbilical hernia without obstruction or gangrene: Secondary | ICD-10-CM | POA: Diagnosis not present

## 2022-03-27 DIAGNOSIS — D649 Anemia, unspecified: Secondary | ICD-10-CM

## 2022-03-27 DIAGNOSIS — I2510 Atherosclerotic heart disease of native coronary artery without angina pectoris: Secondary | ICD-10-CM

## 2022-03-27 DIAGNOSIS — M1990 Unspecified osteoarthritis, unspecified site: Secondary | ICD-10-CM | POA: Diagnosis not present

## 2022-03-27 HISTORY — PX: XI ROBOTIC ASSISTED VENTRAL HERNIA: SHX6789

## 2022-03-27 LAB — CBC
HCT: 34.5 % — ABNORMAL LOW (ref 36.0–46.0)
Hemoglobin: 11.4 g/dL — ABNORMAL LOW (ref 12.0–15.0)
MCH: 32.8 pg (ref 26.0–34.0)
MCHC: 33 g/dL (ref 30.0–36.0)
MCV: 99.1 fL (ref 80.0–100.0)
Platelets: 206 10*3/uL (ref 150–400)
RBC: 3.48 MIL/uL — ABNORMAL LOW (ref 3.87–5.11)
RDW: 14.1 % (ref 11.5–15.5)
WBC: 16.3 10*3/uL — ABNORMAL HIGH (ref 4.0–10.5)
nRBC: 0 % (ref 0.0–0.2)

## 2022-03-27 LAB — CREATININE, SERUM
Creatinine, Ser: 1.13 mg/dL — ABNORMAL HIGH (ref 0.44–1.00)
GFR, Estimated: 56 mL/min — ABNORMAL LOW (ref >60)

## 2022-03-27 SURGERY — REPAIR, HERNIA, VENTRAL, ROBOT-ASSISTED
Anesthesia: General

## 2022-03-27 MED ORDER — PROPOFOL 500 MG/50ML IV EMUL
INTRAVENOUS | Status: AC
  Filled 2022-03-27: qty 50

## 2022-03-27 MED ORDER — SIMETHICONE 80 MG PO CHEW: 80.0000 mg | CHEWABLE_TABLET | Freq: Four times a day (QID) | ORAL | Status: DC | PRN

## 2022-03-27 MED ORDER — BUPIVACAINE LIPOSOME 1.3 % IJ SUSP: 20.0000 mL | Freq: Once | INTRAMUSCULAR | Status: DC

## 2022-03-27 MED ORDER — ENOXAPARIN SODIUM 40 MG/0.4ML IJ SOSY
40.0000 mg | PREFILLED_SYRINGE | INTRAMUSCULAR | Status: DC
  Administered 2022-03-28 – 2022-03-29 (×2): 40 mg via SUBCUTANEOUS
  Filled 2022-03-27 (×2): qty 0.4

## 2022-03-27 MED ORDER — PHENYLEPHRINE 80 MCG/ML (10ML) SYRINGE FOR IV PUSH (FOR BLOOD PRESSURE SUPPORT)
PREFILLED_SYRINGE | INTRAVENOUS | Status: AC
  Filled 2022-03-27: qty 10

## 2022-03-27 MED ORDER — MIDAZOLAM HCL 5 MG/5ML IJ SOLN
INTRAMUSCULAR | Status: DC | PRN
  Administered 2022-03-27: 2 mg via INTRAVENOUS

## 2022-03-27 MED ORDER — SUCCINYLCHOLINE CHLORIDE 200 MG/10ML IV SOSY
PREFILLED_SYRINGE | INTRAVENOUS | Status: DC | PRN
  Administered 2022-03-27: 140 mg via INTRAVENOUS

## 2022-03-27 MED ORDER — LIDOCAINE HCL (PF) 2 % IJ SOLN
INTRAMUSCULAR | Status: AC
  Filled 2022-03-27: qty 10

## 2022-03-27 MED ORDER — FENTANYL CITRATE (PF) 250 MCG/5ML IJ SOLN
INTRAMUSCULAR | Status: AC
  Filled 2022-03-27: qty 5

## 2022-03-27 MED ORDER — CHLORHEXIDINE GLUCONATE 0.12 % MT SOLN
15.0000 mL | Freq: Once | OROMUCOSAL | Status: AC
  Administered 2022-03-27: 15 mL via OROMUCOSAL

## 2022-03-27 MED ORDER — BUPIVACAINE LIPOSOME 1.3 % IJ SUSP
INTRAMUSCULAR | Status: DC | PRN
  Administered 2022-03-27: 20 mL

## 2022-03-27 MED ORDER — FLUOXETINE HCL 10 MG PO CAPS
10.0000 mg | ORAL_CAPSULE | Freq: Every evening | ORAL | Status: DC
  Administered 2022-03-27 – 2022-03-28 (×2): 10 mg via ORAL
  Filled 2022-03-27 (×4): qty 1

## 2022-03-27 MED ORDER — GABAPENTIN 300 MG PO CAPS
300.0000 mg | ORAL_CAPSULE | ORAL | Status: AC
  Administered 2022-03-27: 300 mg via ORAL
  Filled 2022-03-27: qty 1

## 2022-03-27 MED ORDER — LACTATED RINGERS IV SOLN: INTRAVENOUS | Status: DC

## 2022-03-27 MED ORDER — LIDOCAINE HCL (CARDIAC) PF 100 MG/5ML IV SOSY
PREFILLED_SYRINGE | INTRAVENOUS | Status: DC | PRN
  Administered 2022-03-27: 60 mg via INTRAVENOUS

## 2022-03-27 MED ORDER — DOCUSATE SODIUM 100 MG PO CAPS
100.0000 mg | ORAL_CAPSULE | Freq: Two times a day (BID) | ORAL | Status: DC
  Administered 2022-03-27 – 2022-03-29 (×3): 100 mg via ORAL
  Filled 2022-03-27 (×4): qty 1

## 2022-03-27 MED ORDER — FENTANYL CITRATE (PF) 100 MCG/2ML IJ SOLN
INTRAMUSCULAR | Status: DC | PRN
Start: 2022-03-27 — End: 2022-03-27
  Administered 2022-03-27 (×3): 50 ug via INTRAVENOUS
  Administered 2022-03-27: 100 ug via INTRAVENOUS

## 2022-03-27 MED ORDER — BUPIVACAINE-EPINEPHRINE 0.25% -1:200000 IJ SOLN
INTRAMUSCULAR | Status: DC | PRN
  Administered 2022-03-27: 30 mL

## 2022-03-27 MED ORDER — PHENYLEPHRINE HCL-NACL 20-0.9 MG/250ML-% IV SOLN
INTRAVENOUS | Status: AC
  Filled 2022-03-27: qty 250

## 2022-03-27 MED ORDER — METHOCARBAMOL 1000 MG/10ML IJ SOLN: 500.0000 mg | Freq: Four times a day (QID) | INTRAVENOUS | Status: DC | PRN

## 2022-03-27 MED ORDER — ENOXAPARIN SODIUM 40 MG/0.4ML IJ SOSY
40.0000 mg | PREFILLED_SYRINGE | Freq: Once | INTRAMUSCULAR | Status: AC
  Administered 2022-03-27: 40 mg via SUBCUTANEOUS
  Filled 2022-03-27: qty 0.4

## 2022-03-27 MED ORDER — PROPOFOL 10 MG/ML IV BOLUS
INTRAVENOUS | Status: DC | PRN
  Administered 2022-03-27: 150 mg via INTRAVENOUS

## 2022-03-27 MED ORDER — DEXAMETHASONE SODIUM PHOSPHATE 10 MG/ML IJ SOLN
INTRAMUSCULAR | Status: AC
  Filled 2022-03-27: qty 1

## 2022-03-27 MED ORDER — ACETAMINOPHEN 325 MG PO TABS
650.0000 mg | ORAL_TABLET | Freq: Four times a day (QID) | ORAL | Status: DC
  Administered 2022-03-27 – 2022-03-29 (×6): 650 mg via ORAL
  Filled 2022-03-27 (×6): qty 2

## 2022-03-27 MED ORDER — ONDANSETRON HCL 4 MG/2ML IJ SOLN
INTRAMUSCULAR | Status: AC
  Filled 2022-03-27: qty 2

## 2022-03-27 MED ORDER — CHLORHEXIDINE GLUCONATE 0.12 % MT SOLN: 15.0000 mL | Freq: Once | OROMUCOSAL | Status: DC

## 2022-03-27 MED ORDER — SUGAMMADEX SODIUM 500 MG/5ML IV SOLN
INTRAVENOUS | Status: AC
  Filled 2022-03-27: qty 5

## 2022-03-27 MED ORDER — METOLAZONE 2.5 MG PO TABS
2.5000 mg | ORAL_TABLET | Freq: Every day | ORAL | Status: DC
  Administered 2022-03-28 – 2022-03-29 (×2): 2.5 mg via ORAL
  Filled 2022-03-27 (×2): qty 1

## 2022-03-27 MED ORDER — HYDROMORPHONE HCL 1 MG/ML IJ SOLN: 1.0000 mg | INTRAMUSCULAR | Status: DC | PRN

## 2022-03-27 MED ORDER — SUGAMMADEX SODIUM 500 MG/5ML IV SOLN
INTRAVENOUS | Status: DC | PRN
  Administered 2022-03-27: 500 mg via INTRAVENOUS

## 2022-03-27 MED ORDER — METOPROLOL SUCCINATE ER 25 MG PO TB24
25.0000 mg | ORAL_TABLET | Freq: Every day | ORAL | Status: DC
  Administered 2022-03-28: 25 mg via ORAL
  Filled 2022-03-27: qty 1

## 2022-03-27 MED ORDER — BUPIVACAINE LIPOSOME 1.3 % IJ SUSP
INTRAMUSCULAR | Status: AC
  Filled 2022-03-27: qty 20

## 2022-03-27 MED ORDER — CIPROFLOXACIN IN D5W 400 MG/200ML IV SOLN
400.0000 mg | INTRAVENOUS | Status: AC
  Administered 2022-03-27: 200 mg via INTRAVENOUS
  Filled 2022-03-27: qty 200

## 2022-03-27 MED ORDER — LISINOPRIL 20 MG PO TABS
20.0000 mg | ORAL_TABLET | Freq: Every day | ORAL | Status: DC
  Administered 2022-03-29: 20 mg via ORAL
  Filled 2022-03-27: qty 1

## 2022-03-27 MED ORDER — PHENYLEPHRINE HCL (PRESSORS) 10 MG/ML IV SOLN
INTRAVENOUS | Status: DC | PRN
  Administered 2022-03-27 (×2): 160 ug via INTRAVENOUS

## 2022-03-27 MED ORDER — BUPIVACAINE-EPINEPHRINE (PF) 0.25% -1:200000 IJ SOLN
INTRAMUSCULAR | Status: AC
  Filled 2022-03-27: qty 30

## 2022-03-27 MED ORDER — ALBUMIN HUMAN 5 % IV SOLN
INTRAVENOUS | Status: AC
  Filled 2022-03-27: qty 250

## 2022-03-27 MED ORDER — OXYCODONE HCL 5 MG PO TABS
5.0000 mg | ORAL_TABLET | ORAL | Status: DC | PRN
  Administered 2022-03-27 – 2022-03-28 (×2): 5 mg via ORAL
  Filled 2022-03-27 (×2): qty 1

## 2022-03-27 MED ORDER — PROCHLORPERAZINE EDISYLATE 10 MG/2ML IJ SOLN: 10.0000 mg | INTRAMUSCULAR | Status: DC | PRN

## 2022-03-27 MED ORDER — ACETAMINOPHEN 500 MG PO TABS
1000.0000 mg | ORAL_TABLET | ORAL | Status: AC
  Administered 2022-03-27: 1000 mg via ORAL
  Filled 2022-03-27: qty 2

## 2022-03-27 MED ORDER — MIDAZOLAM HCL 2 MG/2ML IJ SOLN
INTRAMUSCULAR | Status: AC
  Filled 2022-03-27: qty 2

## 2022-03-27 MED ORDER — CHLORHEXIDINE GLUCONATE CLOTH 2 % EX PADS: 6.0000 | MEDICATED_PAD | Freq: Once | CUTANEOUS | Status: DC

## 2022-03-27 MED ORDER — PHENYLEPHRINE HCL-NACL 20-0.9 MG/250ML-% IV SOLN
INTRAVENOUS | Status: DC | PRN
  Administered 2022-03-27: 75 ug/min via INTRAVENOUS

## 2022-03-27 MED ORDER — ORAL CARE MOUTH RINSE: 15.0000 mL | Freq: Once | OROMUCOSAL | Status: DC

## 2022-03-27 MED ORDER — SUCCINYLCHOLINE CHLORIDE 200 MG/10ML IV SOSY
PREFILLED_SYRINGE | INTRAVENOUS | Status: AC
  Filled 2022-03-27: qty 10

## 2022-03-27 MED ORDER — GABAPENTIN 300 MG PO CAPS
300.0000 mg | ORAL_CAPSULE | Freq: Three times a day (TID) | ORAL | Status: DC
  Administered 2022-03-27 – 2022-03-29 (×6): 300 mg via ORAL
  Filled 2022-03-27 (×6): qty 1

## 2022-03-27 MED ORDER — HYDROMORPHONE HCL 1 MG/ML IJ SOLN
INTRAMUSCULAR | Status: DC | PRN
  Administered 2022-03-27: 1 mg via INTRAVENOUS

## 2022-03-27 MED ORDER — ROCURONIUM BROMIDE 10 MG/ML (PF) SYRINGE
PREFILLED_SYRINGE | INTRAVENOUS | Status: AC
  Filled 2022-03-27: qty 20

## 2022-03-27 MED ORDER — AMISULPRIDE (ANTIEMETIC) 5 MG/2ML IV SOLN
INTRAVENOUS | Status: AC
  Administered 2022-03-27: 5 mg via INTRAVENOUS
  Filled 2022-03-27: qty 2

## 2022-03-27 MED ORDER — ALBUMIN HUMAN 5 % IV SOLN
12.5000 g | Freq: Once | INTRAVENOUS | Status: AC
  Administered 2022-03-27: 12.5 g via INTRAVENOUS

## 2022-03-27 MED ORDER — ONDANSETRON HCL 4 MG/2ML IJ SOLN
INTRAMUSCULAR | Status: DC | PRN
  Administered 2022-03-27: 4 mg via INTRAVENOUS

## 2022-03-27 MED ORDER — ORAL CARE MOUTH RINSE: 15.0000 mL | Freq: Once | OROMUCOSAL | Status: AC

## 2022-03-27 MED ORDER — ONDANSETRON HCL 4 MG/2ML IJ SOLN: 4.0000 mg | Freq: Four times a day (QID) | INTRAMUSCULAR | Status: DC | PRN

## 2022-03-27 MED ORDER — METRONIDAZOLE 500 MG/100ML IV SOLN
500.0000 mg | INTRAVENOUS | Status: AC
  Administered 2022-03-27: 500 mg via INTRAVENOUS
  Filled 2022-03-27: qty 100

## 2022-03-27 MED ORDER — HYDROMORPHONE HCL 1 MG/ML IJ SOLN: 0.2500 mg | INTRAMUSCULAR | Status: DC | PRN

## 2022-03-27 MED ORDER — 0.9 % SODIUM CHLORIDE (POUR BTL) OPTIME
TOPICAL | Status: DC | PRN
  Administered 2022-03-27: 1000 mL

## 2022-03-27 MED ORDER — HYDROMORPHONE HCL 2 MG/ML IJ SOLN
INTRAMUSCULAR | Status: AC
  Filled 2022-03-27: qty 1

## 2022-03-27 MED ORDER — DEXAMETHASONE SODIUM PHOSPHATE 10 MG/ML IJ SOLN
INTRAMUSCULAR | Status: DC | PRN
  Administered 2022-03-27: 10 mg via INTRAVENOUS

## 2022-03-27 MED ORDER — ROCURONIUM BROMIDE 100 MG/10ML IV SOLN
INTRAVENOUS | Status: DC | PRN
  Administered 2022-03-27: 30 mg via INTRAVENOUS
  Administered 2022-03-27 (×2): 20 mg via INTRAVENOUS
  Administered 2022-03-27: 80 mg via INTRAVENOUS

## 2022-03-27 MED ORDER — AMISULPRIDE (ANTIEMETIC) 5 MG/2ML IV SOLN: 5.0000 mg | Freq: Once | INTRAVENOUS | Status: AC | PRN

## 2022-03-27 MED ORDER — OXYCODONE HCL 5 MG PO TABS
10.0000 mg | ORAL_TABLET | ORAL | Status: DC | PRN
  Filled 2022-03-27: qty 2

## 2022-03-27 SURGICAL SUPPLY — 63 items
BAG COUNTER SPONGE SURGICOUNT (BAG) ×2 IMPLANT
BLADE SURG SZ11 CARB STEEL (BLADE) ×2 IMPLANT
CHLORAPREP W/TINT 26 (MISCELLANEOUS) ×3 IMPLANT
COVER MAYO STAND STRL (DRAPES) ×2 IMPLANT
COVER TIP SHEARS 8 DVNC (MISCELLANEOUS) ×1 IMPLANT
COVER TIP SHEARS 8MM DA VINCI (MISCELLANEOUS) ×1
DERMABOND ADVANCED (GAUZE/BANDAGES/DRESSINGS) ×1
DERMABOND ADVANCED .7 DNX12 (GAUZE/BANDAGES/DRESSINGS) IMPLANT
DEVICE TROCAR PUNCTURE CLOSURE (ENDOMECHANICALS) IMPLANT
DRAPE ARM DVNC X/XI (DISPOSABLE) ×3 IMPLANT
DRAPE COLUMN DVNC XI (DISPOSABLE) ×1 IMPLANT
DRAPE DA VINCI XI ARM (DISPOSABLE) ×4
DRAPE DA VINCI XI COLUMN (DISPOSABLE) ×1
ELECT L-HOOK LAP 45CM DISP (ELECTROSURGICAL) ×2
ELECT PENCIL ROCKER SW 15FT (MISCELLANEOUS) ×2 IMPLANT
ELECT REM PT RETURN 15FT ADLT (MISCELLANEOUS) ×2 IMPLANT
ELECTRODE L-HOOK LAP 45CM DISP (ELECTROSURGICAL) ×1 IMPLANT
GLOVE BIO SURGEON STRL SZ7.5 (GLOVE) ×4 IMPLANT
GLOVE BIOGEL PI IND STRL 8 (GLOVE) ×2 IMPLANT
GLOVE BIOGEL PI INDICATOR 8 (GLOVE) ×2
GOWN STRL REUS W/ TWL XL LVL3 (GOWN DISPOSABLE) ×3 IMPLANT
GOWN STRL REUS W/TWL XL LVL3 (GOWN DISPOSABLE) ×3
GRASPER SUT TROCAR 14GX15 (MISCELLANEOUS) IMPLANT
IRRIG SUCT STRYKERFLOW 2 WTIP (MISCELLANEOUS)
IRRIGATION SUCT STRKRFLW 2 WTP (MISCELLANEOUS) IMPLANT
KIT BASIN OR (CUSTOM PROCEDURE TRAY) ×2 IMPLANT
KIT TURNOVER KIT A (KITS) IMPLANT
MANIFOLD NEPTUNE II (INSTRUMENTS) ×2 IMPLANT
MESH SOFT 12X12IN BARD (Mesh General) ×1 IMPLANT
NDL SPNL 18GX3.5 QUINCKE PK (NEEDLE) ×1 IMPLANT
NEEDLE SPNL 18GX3.5 QUINCKE PK (NEEDLE) ×2 IMPLANT
PACK CARDIOVASCULAR III (CUSTOM PROCEDURE TRAY) ×2 IMPLANT
SEAL CANN UNIV 5-8 DVNC XI (MISCELLANEOUS) ×3 IMPLANT
SEAL XI 5MM-8MM UNIVERSAL (MISCELLANEOUS) ×3
SEALER VESSEL DA VINCI XI (MISCELLANEOUS)
SEALER VESSEL EXT DVNC XI (MISCELLANEOUS) IMPLANT
SOL ANTI FOG 6CC (MISCELLANEOUS) ×1 IMPLANT
SOLUTION ANTI FOG 6CC (MISCELLANEOUS) ×1
SOLUTION ELECTROLUBE (MISCELLANEOUS) ×2 IMPLANT
SPIKE FLUID TRANSFER (MISCELLANEOUS) ×2 IMPLANT
SUT MNCRL AB 4-0 PS2 18 (SUTURE) ×2 IMPLANT
SUT STRAFIX PDS 18 CTX (SUTURE) IMPLANT
SUT STRAFIX SPIRAL 2-0 3 (SUTURE) IMPLANT
SUT STRAFIX SPIRAL 2-0 5 (SUTURE) IMPLANT
SUT STRAFIX SPIRAL 2-0 9 (SUTURE) ×1 IMPLANT
SUT STRAFIX SYMMETRIC 0-0 12 (SUTURE)
SUT STRAFIX SYMMETRIC 0-0 18 (SUTURE)
SUT STRAFIX SYMMETRIC 0-0 24 (SUTURE)
SUT STRAFIX SYMMETRIC 1-0 12 (SUTURE)
SUT STRAFIX SYMMETRIC 1-0 24 (SUTURE) ×4
SUTURE STRAFIX SYMMETRC 0-0 12 (SUTURE) IMPLANT
SUTURE STRAFIX SYMMETRC 0-0 18 (SUTURE) IMPLANT
SUTURE STRAFIX SYMMETRC 0-0 24 (SUTURE) IMPLANT
SUTURE STRAFIX SYMMETRC 1-0 12 (SUTURE) IMPLANT
SUTURE STRAFIX SYMMETRC 1-0 24 (SUTURE) IMPLANT
SYR 20ML LL LF (SYRINGE) ×2 IMPLANT
TAPE STRIPS DRAPE STRL (GAUZE/BANDAGES/DRESSINGS) ×2 IMPLANT
TOWEL OR 17X26 10 PK STRL BLUE (TOWEL DISPOSABLE) ×2 IMPLANT
TOWEL OR NON WOVEN STRL DISP B (DISPOSABLE) IMPLANT
TROCAR ADV FIXATION 12X100MM (TROCAR) ×2 IMPLANT
TROCAR Z-THREAD FIOS 5X100MM (TROCAR) ×2 IMPLANT
TROCAR Z-THREAD OPTICAL 5X100M (TROCAR) IMPLANT
TUBING INSUFFLATION 10FT LAP (TUBING) ×2 IMPLANT

## 2022-03-27 NOTE — Op Note (Signed)
Patient: Janet [name] Janet [name] (04/19/1961, [id number])  Date of Surgery: 03/27/2022   Preoperative Diagnosis: RECURRENT INCISIONAL HERNIA WITH INCARCERATED COLON  Postoperative Diagnosis: RECURRENT INCISIONAL HERNIA WITH INCARCERATED COLON  Surgical Procedure:  XI ROBOTIC ASSISTED RECURRENT INCARCERATED INCISIONAL HERNIA REPAIR WITH MESH  BILATERAL POSTERIOR RECTUS MYOFASCIAL RELEASE BILATERAL TRANSVERSUS ABDOMINIS RELEASE:    Operative Team Members:  Surgeon(s) and Role:    * Makyah Lavigne, Nickola Major, MD - Primary   Anesthesiologist: Belinda Block, MD CRNA: Claudia Desanctis, CRNA; Jonna Munro, CRNA; Niel Hummer, CRNA   Anesthesia: General   Fluids:  Total I/O In: 1200 [I.V.:1000; IV Piggyback:200] Out: 290 [Urine:280; GUYQI:34]  Complications: None  Drains:  None  Specimen: None  Disposition:  PACU - hemodynamically stable.  Plan of Care: Admit for overnight observation  Indications for Procedure: Janet [name] is a 61 y.o. female who presented with a recurrent incisional hernia containing incarcerated transverse colon.  I recommended robotic ventral hernia repair with mesh.  The procedure itself as well as the risks, benefits and alternatives were described.  The risks discussed included but were not limited to the risk of infection, bleeding, damage to nearby structures, recurrent hernia, chronic pain, and mesh complication requiring removal.  After a full discussion and all questions answered, the patient granted consent to proceed.  Findings:  Hernia Location: Ventral hernia location: Epigastric (M2), Umbilical (M3), and Infraumbilical (M4) Hernia Size:  22 cm tall x 11 cm wide  Mesh Size &Type:  35cm x 35 cm Bard Soft Mesh Mesh Position: Sublay - Retromuscular Myofascial Releases:  Bilateral posterior rectus myofascial release Bilateral transversus abdominis release  Description of Procedure: The patient was positioned supine, moderately  flexed at the umbilical level, padded and secured on the operating table.  A timeout procedure was performed.    What is described is a robotic, totally extraperitoneal retromuscular incisional hernia repair with bilateral rectus myofascial release, bilateral transversus abdominis release and retromuscular mesh placement.  Laparoscopic Portion: The retrorectus space was entered in the LEFT hypochondrium, at approximately the midclavicular line utilizing a 5 mm optical-viewing trocar.  Upon safe entry into this space, it was insufflated while performing a blunt dissection with the camera still in the optical trocar. A rectus myofascial release was performed on the LEFT side. Dissection was carried out laterally in the retromuscular plane to the edge of the rectus sheath progressively disconnecting the rectus muscle from the underlying posterior rectus sheath. Both the segmental innervation as well as the intercostal artery and vein brances to the rectus muscle were individually preserved.    During the left sided retrorectus dissection, a 12 mm trocar was placed into the lateral most edge of the retrorectus space.  With these initial trocars in position, the medial most aspect of the retrorectus plane was identified, and the posterior sheath was visualized as it inserted on the linea alba. The posterior sheath was incised with cautery entering the preperitoneal plane. A crossover was performed dissecting under the linea alba in the preperitoneal plane until the right rectus sheath was identified.  After identification of the right rectus sheath, it was incised vertically to enter the retrorectus space on the right. A rectus myofascial release was performed on the RIGHT side.  Blunt dissection was carried out laterally in the retromuscular plane to the edge of the rectus sheath progressively disconnecting the rectus muscle from the underlying posterior rectus sheath. Both the segmental innervation as well as  the intercostal artery and vein brances to  the rectus muscle were individually preserved.   At this juncture, both retrorectus planes were initially connected to each other and there was space for further trocar placement. An 8 mm robotic trocar was placed in the midclavicular line in right retrorectus space.  A 79m robotic trocar was placed within the left rectus musculature in the upper abdomen, and not through the linea alba.  The initial 5 mm access trocar in the midclavicular line within the left retrorectus space was switched out for an 8 mm robotic trocar.   Robotic Portion: The Intuitive daVinci Xi surgical robot was docked in the standard fashion and the procedure begun from the robotic console. A fenestrated bipolar instrument and monopolar shears were used for the dissection.  Dissection was carried down inferiorly preserving the peritoneum and the preperitoneal fat in the midline as it was gently dissected off of the overlying linea alba.  On the right side, the posterior rectus sheath was progressively disconnected from its insertion on the linea alba. This allowed for progression of the right side rectus myofascial release.  The rectus myofascial release accomplished medialization of the posterior rectus sheath towards the midline and disinsertion of the rectus muscle from its surrounding fascia, and thus its encasement in the rectus sheath, allowing for widening of the rectus muscle and transfer of the rectus flap towards the midline.  This will allow for future inset of the medial aspect of the flap for abdominal wall reconstruction.  Similarly, on the left side, the posterior rectus sheath was also progressively disconnected from its insertion on the linea alba.  This allowed for progression of the left side rectus myofascial release.  The rectus myofascial release accomplished medialization of the posterior rectus sheath towards the midline and disinsertion of the rectus muscle from its  surrounding fascia, and thus its encasement in the rectus sheath, allowing for widening of the rectus muscle and transfer of the rectus flap towards the midline.  This will allow for future inset of the medial aspect of the flap for abdominal wall reconstruction.  During the dissection of the midline the hernia defect was identified and the hernia sac was not reducible, therefore the hernia peritoneum was incised circumferentially around the edge of the hernia defect which left a defect within the peritoneum in the midline.  This defect was later closed with a running 2-0 Ethicon Stratafix Spiral PDS suture.  Both the left and the right rectus myofascial releases were performed towards the lower abdomen, past the arcuate line bilaterally.  During this dissection, the peritoneum and preperitoneal fat in the midline were further preserved below the hernia as they were dissected off of the overlying linea alba.   The hernia defect area was now visualized fully.  The hernia defects were located in the epigastric, umbilical, and infraumbilical regions. Utilizing a metric ruler, the defect are was measured intracorporeally to be 11 cm horizontal by 22 cm vertical.  Due to the size of the defect and concern of being able to close all layers without excessive tension, I decided to perform bilateral transversus abdominis releases.  A transversus abdominis release (TAR) was performed on the right side.  The transversus abdominis muscle was identified deep to the posterior rectus sheath and incised vertically along its entire length, entering the pre-peritoneal or pre-transversalis fascia plane.  This disinserted the transversus abdominis muscle from the linea semilunaris.  Since the intercostal nerves, arteries and veins had been preserved during the rectus myofascial release portion of the procedure, they remained  intact during the TAR. The peritoneum was subsequently peeled away from the underside of the divided  transversus abdominis muscle.  This dissection was carried out laterally towards the retroperitoneum.  The TAR accomplished additional medialization of the posterior rectus sheath with its attached peritoneum towards the midline to allow for visceral sac closure.  The TAR also provided further offset of tension of the rectus muscle flap with additional transfer of the rectus muscle towards the midline, as it remained attached to the external and internal abdominal oblique muscles.  This will allow for future inset of the medial aspect of the flap for abdominal wall reconstruction.   A transversus abdominis release (TAR) was performed on the left side.  The transversus abdominis muscle was identified deep to the posterior rectus sheath and incised vertically along its entire length, entering the pre-peritoneal or pre-transversalis fascia plane.  This disinserted the transversus abdominis muscle from the linea semilunaris.  Since the intercostal nerves, arteries and veins had been preserved during the rectus myofascial release portion of the procedure, they remained intact during the TAR. The peritoneum was subsequently peeled away from the underside of the divided transversus abdominis muscle.  This dissection was carried out laterally towards the retroperitoneum.  The TAR accomplished additional medialization of the posterior rectus sheath with its attached peritoneum towards the midline to allow for visceral sac closure.  The TAR also provided further offset of tension of the rectus muscle flap with additional transfer of the rectus muscle towards the midline, as it remained attached to the external and internal abdominal oblique muscles.  This will allow for future inset of the medial aspect of the flap for abdominal wall reconstruction.   The hernia defect was closed utilizing a continuous, #1 Ethicon Stratafix Symmetric PDS Plus suture.  The hernia defect, and subsequently the rectus musculature, came  together well for a complete abdominal wall reconstruction.  The dissected out retrorectus space was measured with a metric ruler so as to determine the size of the proposed mesh.    The robot was undocked and the laparoscope was inserted, inspecting for hemostasis.  The mesh deployment was performed laparoscopically.  Laparoscopic Portion:  A transversus abdominis plane (TAP) block was performed bilaterally with a mixture of marcaine and Exparel.  The anesthetic was first injected into the plane between the transversus abdominis and internal abdominal oblique muscles on the left. The TAP was repeated on the contralateral side.   A piece of bard soft mesh was opened and trimmed to 35 cm x 35 cm. The mesh was advanced into the retrorectus space and the mesh positioned flat against the intact posterior rectus sheaths. The mesh was not fixated as it occupied the entire retromuscular plane, and also covered all of the trocars.  The trocars were removed and the skin closed with 4-0 Monocryl subcuticular sutures and skin glue.   Louanna Raw, MD General, Bariatric, & Minimally Invasive Surgery [facility], [place]

## 2022-03-27 NOTE — Transfer of Care (Signed)
Immediate Anesthesia Transfer of Care Note  Patient: Janet [name]  Procedure(s) Performed: XI ROBOTIC ASSISTED INCISIONAL HERNIA REPAIR WITH MESH AND BILATERAL TRANSVERSUS ABDOMINIS RELEASE  Patient Location: PACU  Anesthesia Type:General  Level of Consciousness: awake, alert , oriented and patient cooperative  Airway & Oxygen Therapy: Patient Spontanous Breathing and Patient connected to face mask oxygen  Post-op Assessment: Report given to RN, Post -op Vital signs reviewed and stable and Patient moving all extremities X 4  Post vital signs: Reviewed and stable  Last Vitals:  Vitals Value Taken Time  BP 130/75 03/27/22 1233  Temp    Pulse 72 03/27/22 1235  Resp 19 03/27/22 1235  SpO2 100 % 03/27/22 1235  Vitals shown include unvalidated device data.  Last Pain:  Vitals:   03/27/22 0544  TempSrc:   PainSc: 0-No pain         Complications: No notable events documented.

## 2022-03-27 NOTE — H&P (Signed)
Admitting Physician: Nickola Major Teryl Mcconaghy  Service: General surgery  CC: hernia  Subjective   HPI: Janet [name] is an 61 y.o. female who is here for hernia repair  Past Medical History:  Diagnosis Date   Anemia    history of anemia from AUB   Anxiety    Arthritis    Ascending aortic aneurysm ([place])    3m on 07/01/21 echo   Breast cancer ([place] 10/16/2010   left breast/  NO CHEMO   Complication of anesthesia    difficult IV access- never needed PICC line   Depression    no meds   Dysrhythmia    Edema of both legs    GERD (gastroesophageal reflux disease)    Hypertension    ekg 1/13 on chart   Pneumonia 10/17/1987   hx   PONV (postoperative nausea and vomiting)    Umbilical hernia    currently - not repaired yet    Past Surgical History:  Procedure Laterality Date   ABDOMINAL HYSTERECTOMY  04/30/2012   Procedure: HYSTERECTOMY ABDOMINAL;  Surgeon: PImagene GurneyA. GAlycia Rossetti MD;  Location: [facility];  Service: Gynecology;  Laterality: N/A;   ABLATION  2012   BREAST CAPSULECTOMY WITH IMPLANT EXCHANGE  10/02/2012   Procedure: BREAST CAPSULECTOMY WITH IMPLANT EXCHANGE;  Surgeon: CTheodoro Kos DO;  [facility]  Service: Plastics;  Laterality: Left;   LEFT EXPANDER REMOVAL WITH CAPSULECTOMY AND PLACEMENT OF IMPLANT   BREAST IMPLANT EXCHANGE Left 01/20/2013   Procedure: REVISION OF LEFT BREAST  IMPLANT ;  Surgeon: CTheodoro Kos DO;  [place] [facility]  Service: Plastics;  Laterality: Left;   BREAST REDUCTION SURGERY N/A 01/20/2013   Procedure: Abdominal fat graft to breast reconstruction area;  Surgeon: CTheodoro Kos DO;  [facility] [facility]  Service: Plastics;  Laterality: N/A;   BREAST SURGERY  10/16/2010   lt mastectomy/ axillary dissection   BREAST SURGERY  10/16/2010   tissue expander lt   CARDIOVERSION N/A 12/02/2021   Procedure: CARDIOVERSION;  Surgeon: HPixie Casino MD;  [place] [facility]  Service: Cardiovascular;  Laterality: N/A;    COLONOSCOPY     DILATION AND CURETTAGE OF UTERUS  01/26/2012   Procedure: DILATATION AND CURETTAGE;  Surgeon: LLahoma Crocker MD;  [facility];  Service: Gynecology;;   ESOPHAGOGASTRODUODENOSCOPY     HERNIA REPAIR     LAPAROTOMY  04/30/2012   Procedure: EXPLORATORY LAPAROTOMY;  Surgeon: PImagene GurneyA. GAlycia Rossetti MD;  [facility];  Service: Gynecology;  Laterality: N/A;   MASTECTOMY     Left side   MASTOPEXY  10/02/2012   Procedure: MASTOPEXY;  Surgeon: CTheodoro Kos DO;  [place] [facility]  Service: Plastics;  Laterality: Right;  RIGHT BREAST MASTOPEXY REDUCTION FOR SYMMETRY,   MULTIPLE EXTRACTIONS WITH ALVEOLOPLASTY  10/23/2011   MULTIPLE TOOTH EXTRACTIONS     NOVASURE ABLATION  03/01/2012   RECONSTRUCTION BREAST IMMEDIATE / DELAYED W/ TISSUE EXPANDER     tissue expander lt-9/12   SALPINGOOPHORECTOMY  04/30/2012   Procedure: SALPINGO OOPHERECTOMY;  Surgeon: PImagene GurneyA. GAlycia Rossetti MD;  [facility];  Service: Gynecology;  Laterality: Bilateral;   TOE OSTEOTOMY     rt toe   VENTRAL HERNIA REPAIR  04/30/2012   Procedure: HERNIA REPAIR VENTRAL ADULT;  Surgeon: PImagene GurneyA. GAlycia Rossetti MD;  [facility];  Service: Gynecology;  Laterality: N/A;    Family History  Problem Relation Age of Onset   Atrial fibrillation Mother    Depression Mother  COPD Mother    Heart attack Mother    Prostate cancer Father        mets   Heart attack Brother    Atrial fibrillation Maternal Grandmother     Social:  reports that she has never smoked. She has never used smokeless tobacco. She reports that she does not drink alcohol and does not use drugs.  Allergies:  Allergies  Allergen Reactions   Petrolatum-Zinc Oxide Rash    Allergic to surgical tape - no reaction listed.   Amoxicillin Other (See Comments), Nausea Only and Rash    Decreased heart rate Other reaction(s): Other (See Comments), Unknown "drops heart rate in to the 40s" Decreased heart rate "drops heart rate in  to the 40s" Decreased heart rate   Cardizem [Diltiazem] Nausea Only    Swelling/lightheaded/body aches/headache/unsteadiness   Clarithromycin Other (See Comments)    Breaks mouth out   Ivp Dye [Iodinated Contrast Media] Other (See Comments)    States made bp systolic has increased and diastolic has decreased and hasnt felt well x 3 weeks since dye adm   Tape Dermatitis    Only can have paper tape    Medications: Current Outpatient Medications  Medication Instructions   acetaminophen (TYLENOL) 1,000 mg, Oral, Every 6 hours PRN   apixaban (ELIQUIS) 5 mg, Oral, 2 times daily   Calcium Carb-Cholecalciferol (CALCIUM 500/D PO) 1 tablet, Oral, Every morning   Cholecalciferol (VITAMIN D3 PO) 1 tablet, Oral, Every morning   FLUoxetine (PROZAC) 10 mg, Oral, Every evening   lisinopril (ZESTRIL) 20 MG tablet TAKE ONE (1) TABLET BY MOUTH EVERY DAY   metolazone (ZAROXOLYN) 2.5 mg, Oral, Daily   metoprolol succinate (TOPROL-XL) 75 mg, Oral, Daily   Misc Natural Products (TART CHERRY ADVANCED) CAPS 1 capsule, Oral, Every evening, 1200 mg/capsule   Multiple Vitamin (MULTIVITAMIN WITH MINERALS) TABS 1 tablet, Oral, Daily   Potassium Gluconate (SM POTASSIUM PO) 99 mcg, Oral, Every morning   Turmeric 500 mg, Oral, Every morning   vitamin B-12 (CYANOCOBALAMIN) 50 mcg, Oral, Every morning   vitamin C (ASCORBIC ACID) 500 mg, Oral, Every morning    ROS - all of the below systems have been reviewed with the patient and positives are indicated with bold text General: chills, fever or night sweats Eyes: blurry vision or double vision ENT: epistaxis or sore throat Allergy/Immunology: itchy/watery eyes or nasal congestion Hematologic/Lymphatic: bleeding problems, blood clots or swollen lymph nodes Endocrine: temperature intolerance or unexpected weight changes Breast: new or changing breast lumps or nipple discharge Resp: cough, shortness of breath, or wheezing CV: chest pain or dyspnea on exertion GI:  as per HPI GU: dysuria, trouble voiding, or hematuria MSK: joint pain or joint stiffness Neuro: TIA or stroke symptoms Derm: pruritus and skin lesion changes Psych: anxiety and depression  Objective   PE Blood pressure 111/68, pulse 64, temperature 98.3 F (36.8 C), temperature source Oral, resp. rate 20, weight (!) 166.9 kg, last menstrual period 03/15/2012, SpO2 96 %. Constitutional: NAD; conversant; no deformities Eyes: Moist conjunctiva; no lid lag; anicteric; PERRL Neck: Trachea midline; no thyromegaly Lungs: Normal respiratory effort; no tactile fremitus CV: RRR; no palpable thrills; no pitting edema GI: Abd Soft, hernias difficult to palpate; no palpable hepatosplenomegaly MSK: Normal range of motion of extremities; no clubbing/cyanosis Psychiatric: Appropriate affect; alert and oriented x3 Lymphatic: No palpable cervical or axillary lymphadenopathy  No results found for this or any previous visit (from the past 24 hour(s)).  Imaging Orders  No imaging studies  ordered today  CT Abd/Pel 09/20/21: [facility]) 1. No evidence of renal or ureteral calculus or obstructive uropathy bilaterally. 2. Multiple periumbilical hernias in the anterior abdominal wall. The largest hernia is in the right lower quadrant containing nonobstructed colon. 3. Diverticulosis without diverticulitis.  11cm tall x 10 cm wide area of fascial defects  The CT images are representative of the CT completed at [facility]. The patient brought a CD to the office and I reviewed the images personally, and included snippets of representative images above.   Assessment and Plan   Labs, Imaging and Diagnostic Testing:  Assessment and Plan:   Diagnoses and all orders for this visit:  Recurrent incisional hernia  Morbid obesity with body mass index (BMI) of 50.0 to 59.9 in adult (CMS-HCC)  Paroxysmal A-fib (CMS-HCC)    Ms. Janet [name] has a recurrent ventral hernia. I  recommended robotic, possibly open, recurrent ventral hernia repair with mesh. The hernia defect measures approximately 11 cm tall by 10 cm wide with multiple defects along the lower midline laparotomy incision. We discussed this procedure itself as well as its risk, benefits, and alternatives. We discussed the risk of recurrent hernia, and mesh complications. I discussed her specific risk factors for surgery including blood thinners increasing her risk for bleeding complications, her history of atrial fibrillation and heart disease increase her risk for cardiac complication, and obesity increasing her risk for surgery and her risk for recurrent hernia postoperatively. We discussed the option of attempting to address the disease of obesity prior to proceeding with surgery. We discussed the spectrum of obesity treatment including diet, exercise, monitor dieting with the assistance of the dietitian, monitoring exercise programs, medical weight loss, and metabolic surgery. Unfortunately she has a significant mount of her colon protruding through a narrowed defect, and I am concerned about her developing obstructive symptoms. She is not interested in delaying surgery and understands the risks, and would rather proceed with surgery soon as possible. She completed cardiac evaluation preoperatively.  We will proceed as scheduled.  Felicie Morn, MD  [facility] Use [email or web address] to contact on call provider

## 2022-03-27 NOTE — Anesthesia Postprocedure Evaluation (Signed)
Anesthesia Post Note  Patient: Janet [name]  Procedure(s) Performed: XI ROBOTIC ASSISTED INCISIONAL HERNIA REPAIR WITH MESH AND BILATERAL TRANSVERSUS ABDOMINIS RELEASE     Patient location during evaluation: PACU Anesthesia Type: General Level of consciousness: awake Pain management: pain level controlled Vital Signs Assessment: post-procedure vital signs reviewed and stable Cardiovascular status: stable Postop Assessment: no apparent nausea or vomiting Anesthetic complications: no   No notable events documented.  Last Vitals:  Vitals:   03/27/22 1231 03/27/22 1245  BP: 130/75 (!) 114/45  Pulse: 77 71  Resp: 18 17  Temp: 36.6 C   SpO2: 95% 95%    Last Pain:  Vitals:   03/27/22 1245  TempSrc:   PainSc: Asleep                 [name]

## 2022-03-27 NOTE — Anesthesia Procedure Notes (Signed)
Procedure Name: Intubation Date/Time: 03/27/2022 7:48 AM  Performed by: Jonna Munro, CRNAPre-anesthesia Checklist: Patient identified, Emergency Drugs available, Suction available, Patient being monitored and Timeout performed Patient Re-evaluated:Patient Re-evaluated prior to induction Oxygen Delivery Method: Circle system utilized Preoxygenation: Pre-oxygenation with 100% oxygen Induction Type: IV induction, Rapid sequence and Cricoid Pressure applied Laryngoscope Size: Mac and 3 Grade View: Grade I Tube type: Oral Tube size: 7.0 mm Number of attempts: 1 Airway Equipment and Method: Stylet Placement Confirmation: ETT inserted through vocal cords under direct vision, positive ETCO2 and breath sounds checked- equal and bilateral Secured at: 22 cm Tube secured with: Tape Dental Injury: Teeth and Oropharynx as per pre-operative assessment

## 2022-03-28 ENCOUNTER — Encounter (HOSPITAL_COMMUNITY): Payer: Self-pay | Admitting: Surgery

## 2022-03-28 ENCOUNTER — Other Ambulatory Visit: Payer: Self-pay

## 2022-03-28 DIAGNOSIS — K430 Incisional hernia with obstruction, without gangrene: Secondary | ICD-10-CM | POA: Diagnosis not present

## 2022-03-28 LAB — BASIC METABOLIC PANEL
Anion gap: 8 (ref 5–15)
BUN: 27 mg/dL — ABNORMAL HIGH (ref 6–20)
CO2: 29 mmol/L (ref 22–32)
Calcium: 8.4 mg/dL — ABNORMAL LOW (ref 8.9–10.3)
Chloride: 101 mmol/L (ref 98–111)
Creatinine, Ser: 1.19 mg/dL — ABNORMAL HIGH (ref 0.44–1.00)
GFR, Estimated: 52 mL/min — ABNORMAL LOW (ref >60)
Glucose, Bld: 142 mg/dL — ABNORMAL HIGH (ref 70–99)
Potassium: 4.4 mmol/L (ref 3.5–5.1)
Sodium: 138 mmol/L (ref 135–145)

## 2022-03-28 LAB — CBC
HCT: 33.8 % — ABNORMAL LOW (ref 36.0–46.0)
Hemoglobin: 11.2 g/dL — ABNORMAL LOW (ref 12.0–15.0)
MCH: 32.7 pg (ref 26.0–34.0)
MCHC: 33.1 g/dL (ref 30.0–36.0)
MCV: 98.5 fL (ref 80.0–100.0)
Platelets: 240 10*3/uL (ref 150–400)
RBC: 3.43 MIL/uL — ABNORMAL LOW (ref 3.87–5.11)
RDW: 14.2 % (ref 11.5–15.5)
WBC: 14.7 10*3/uL — ABNORMAL HIGH (ref 4.0–10.5)
nRBC: 0 % (ref 0.0–0.2)

## 2022-03-28 NOTE — Evaluation (Signed)
Occupational Therapy Evaluation Patient Details Name: Janet [name] MRN: [id number] DOB: Mar 11, 1961 Today's Date: 03/28/2022   History of Present Illness Patient is a 61 year old female who presented to the hosptial for recurrent ventral henria. patient underwent an incisional hernai repair with mesh and bilateral transversus abdominis release on 6/12. PMH:h/o breast cancer, A fib, obesity,   Clinical Impression   Patient is a 61 year old female who was admitted for above. Patient lives at home alone with PRN support from neighbors.Patient was noted to have elevated HR to 160s with toileting tasks that was able to calm back down to 100s seated in recliner. Nurse made aware. Patient reported that "it does that" and denies any symptoms. Patient was noted to have decreased knowledge of AD/AE for LB dressing tasks. Patient would continue to benefit from skilled OT services at this time while admitted to address noted deficits in order to improve overall safety and independence in ADLs.        Recommendations for follow up therapy are one component of a multi-disciplinary discharge planning process, led by the attending physician.  Recommendations may be updated based on patient status, additional functional criteria and insurance authorization.   Follow Up Recommendations  No OT follow up    Assistance Recommended at Discharge PRN  Patient can return home with the following Assistance with cooking/housework    Functional Status Assessment  Patient has had a recent decline in their functional status and demonstrates the ability to make significant improvements in function in a reasonable and predictable amount of time.  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    Transfers                          Balance Overall balance assessment: Mild deficits observed, not formally tested                                         ADL either performed or assessed with clinical judgement   ADL Overall ADL's : Needs assistance/impaired Eating/Feeding: Modified independent;Sitting   Grooming: Modified independent;Sitting   Upper Body Bathing: Supervision/ safety;Sitting   Lower Body Bathing: Moderate assistance;Sitting/lateral leans   Upper Body Dressing : Sitting;Minimal assistance   Lower Body Dressing: Moderate assistance;Sitting/lateral leans   Toilet Transfer: Min guard;Rolling walker (2 wheels) Toilet Transfer Details (indicate cue type and reason): with increased time Toileting- Clothing Manipulation and Hygiene: Supervision/safety;Sit to/from stand;Sitting/lateral lean               Vision Patient Visual Report: No change from baseline       Perception     Praxis      Pertinent Vitals/Pain Pain Assessment Pain Assessment: 0-10 Pain Score: 2  Pain Location: twinges with sneezes and coughs Pain Descriptors / Indicators: Tender Pain Intervention(s): Monitored during session     Hand Dominance     Extremity/Trunk Assessment Upper Extremity Assessment Upper Extremity Assessment: Overall  WFL for tasks assessed (noted to have edema in bilateral posterior hands with IV removed from both at this time. patient reported pain in L hand from infultrated IV)   Lower Extremity Assessment Lower Extremity Assessment: Defer to PT evaluation   Cervical / Trunk Assessment Cervical / Trunk Assessment: Normal   Communication Communication Communication: No difficulties   Cognition Arousal/Alertness: Awake/alert Behavior During Therapy: WFL for tasks  assessed/performed Overall Cognitive Status: Within Functional Limits for tasks assessed                                       General Comments       Exercises     Shoulder Instructions      Home Living Family/patient expects to be discharged to:: Private residence Living Arrangements: Alone Available Help at Discharge: Neighbor;Available PRN/intermittently Type of Home: House Home Access: Stairs to enter [employer] of Steps: 6 Entrance Stairs-Rails: Can reach both Home Layout: One level     Bathroom Shower/Tub: Walk-in shower             Additional Comments: walker left over from mother in shed      Prior Functioning/Environment Prior Level of Function : Independent/Modified Independent                        OT Problem List: Decreased activity tolerance;Impaired balance (sitting and/or standing);Decreased safety awareness;Cardiopulmonary status limiting activity;Decreased knowledge of precautions;Decreased knowledge of use of DME or AE      OT Treatment/Interventions: Self-care/ADL training;Therapeutic exercise;Neuromuscular education;Energy conservation;DME and/or AE instruction;Therapeutic activities;Balance training;Patient/family education    OT Goals(Current goals can be found in the care plan section) Acute Rehab OT Goals Patient Stated Goal: to get home soon OT Goal Formulation: With patient Time For Goal Achievement: 04/11/22 Potential to Achieve Goals: Good  OT Frequency: Min 2X/week    Co-evaluation              AM-PAC OT "6 Clicks" Daily Activity     Outcome Measure Help from another person eating meals?: None Help from another person taking care of personal grooming?: None Help from another person toileting, which includes using toliet, bedpan, or urinal?: A Little Help from another person bathing (including washing, rinsing, drying)?: A Lot Help from another person to put on and taking off regular upper  body clothing?: A Little Help from another person to put on and taking off regular lower body clothing?: A Lot 6 Click Score: 18   End of Session Nurse Communication: Other (comment) (HR during session)  Activity Tolerance: Patient tolerated treatment well Patient left: in chair;with call bell/phone within reach                   Time: 1116-1150 OT Time Calculation (min): 34 min Charges:  OT General Charges $OT Visit: 1 Visit OT Evaluation $OT Eval Low Complexity: 1 Low  Leota Sauers, MS Acute Rehabilitation Department Office# [phone] Pager# [phone]   Marcellina Millin 03/28/2022, 12:18 PM

## 2022-03-28 NOTE — Evaluation (Signed)
Physical Therapy Evaluation Patient Details Name: Janet [name] MRN: [id number] DOB: 05-10-1961 Today's Date: 03/28/2022  History of Present Illness  Patient is a 61 year old female who presented to the hosptial for recurrent ventral henria. patient underwent an incisional hernai repair with mesh and bilateral transversus abdominis release on 6/12. PMH:h/o breast cancer, A fib, obesity,  Clinical Impression  On eval, pt was Min guard assist for transfers and ambulation. She walked ~250 feet with a RW. Some abd pain with activity. HR up to 160 bpm with activity/ambulation-RN and pt aware of this. End of session HR 100 bpm. Will plan to follow and progress activity as tolerated.        Recommendations for follow up therapy are one component of a multi-disciplinary discharge planning process, led by the attending physician.  Recommendations may be updated based on patient status, additional functional criteria and insurance authorization.  Follow Up Recommendations No PT follow up    Assistance Recommended at Discharge PRN  Patient can return home with the following  Assist for transportation;Assistance with cooking/housework    Equipment Recommendations None recommended by PT  Recommendations for Other Services       Functional Status Assessment Patient has had a recent decline in their functional status and demonstrates the ability to make significant improvements in function in a reasonable and predictable amount of time.     Precautions / Restrictions Precautions Precaution Comments: monitor HR, hernia surgery Restrictions Weight Bearing Restrictions: No      Mobility  Bed Mobility               General bed mobility comments: oob in recliner    Transfers Overall transfer level: Needs assistance Equipment used: Rolling walker (2 wheels) Transfers: Sit to/from Stand Sit to Stand: Min guard           General transfer comment: Increased time. Pt prefers to  rock. Cues for safety, technique.    Ambulation/Gait Ambulation/Gait assistance: Min guard Gait Distance (Feet): 250 Feet Assistive device: Rolling walker (2 wheels) Gait Pattern/deviations: Step-through pattern, Decreased stride length       General Gait Details: Min guard for safety. No LOB with RW use. Good gait speed. HR up to 160 bpm, O2 >90%  Stairs            Wheelchair Mobility    Modified Rankin (Stroke Patients Only)       Balance Overall balance assessment: Mild deficits observed, not formally tested                                           Pertinent Vitals/Pain Pain Assessment Pain Assessment: 0-10 Pain Score: 2  Pain Location: twinges with sneezes and coughs Pain Descriptors / Indicators: Discomfort, Sore Pain Intervention(s): Monitored during session    Home Living Family/patient expects to be discharged to:: Private residence Living Arrangements: Alone Available Help at Discharge: Neighbor;Available PRN/intermittently Type of Home: House Home Access: Stairs to enter Entrance Stairs-Rails: Can reach both Entrance Stairs-Number of Steps: 6   Home Layout: One level   Additional Comments: walker left over from mother in shed    Prior Function Prior Level of Function : Independent/Modified Independent                     Hand Dominance        Extremity/Trunk Assessment   Upper  Extremity Assessment Upper Extremity Assessment: Defer to OT evaluation    Lower Extremity Assessment Lower Extremity Assessment: Overall WFL for tasks assessed    Cervical / Trunk Assessment Cervical / Trunk Assessment: Normal  Communication   Communication: No difficulties  Cognition Arousal/Alertness: Awake/alert Behavior During Therapy: WFL for tasks assessed/performed Overall Cognitive Status: Within Functional Limits for tasks assessed                                          General Comments       Exercises     Assessment/Plan    PT Assessment Patient needs continued PT services  PT Problem List Decreased mobility;Decreased activity tolerance;Decreased balance;Decreased range of motion;Decreased knowledge of use of DME;Pain       PT Treatment Interventions DME instruction;Therapeutic activities;Therapeutic exercise;Gait training;Patient/family education;Functional mobility training;Balance training    PT Goals (Current goals can be found in the Care Plan section)  Acute Rehab PT Goals Patient Stated Goal: less pain. regan PLOF PT Goal Formulation: With patient Time For Goal Achievement: 04/11/22 Potential to Achieve Goals: Good    Frequency Min 3X/week     Co-evaluation               AM-PAC PT "6 Clicks" Mobility  Outcome Measure Help needed turning from your back to your side while in a flat bed without using bedrails?: A Little Help needed moving from lying on your back to sitting on the side of a flat bed without using bedrails?: A Little Help needed moving to and from a bed to a chair (including a wheelchair)?: A Little Help needed standing up from a chair using your arms (e.g., wheelchair or bedside chair)?: A Little Help needed to walk in hospital room?: A Little Help needed climbing 3-5 steps with a railing? : A Little 6 Click Score: 18    End of Session Equipment Utilized During Treatment: Gait belt Activity Tolerance: Patient tolerated treatment well;Patient limited by pain Patient left: in chair;with call bell/phone within reach   PT Visit Diagnosis: Pain;Difficulty in walking, not elsewhere classified (R26.2)    Time: 5956-3875 PT Time Calculation (min) (ACUTE ONLY): 34 min   Charges:   PT Evaluation $PT Eval Moderate Complexity: 1 Mod           Doreatha Massed, PT Acute Rehabilitation  Office: [phone] Pager: [phone]

## 2022-03-28 NOTE — Progress Notes (Signed)
Transition of Care [facility]) Screening Note  Patient Details  Name: MARYON KEMNITZ Date of Birth: 1961-04-11  Transition of Care [facility]) CM/SW Contact:    Sherie Don, LCSW Phone Number: 03/28/2022, 1:49 PM  Transition of Care Department [facility]) has reviewed patient and no TOC needs have been identified at this time. We will continue to monitor patient advancement through interdisciplinary progression rounds. If new patient transition needs arise, please place a TOC consult.

## 2022-03-28 NOTE — Progress Notes (Signed)
Progress Note: General Surgery Service   Chief Complaint/Subjective: Feeling a little weak.    Objective: Vital signs in last 24 hours: Temp:  [97.4 F (36.3 C)-97.9 F (36.6 C)] 97.8 F (36.6 C) (06/13 0923) Pulse Rate:  [44-106] 93 (06/13 0923) Resp:  [12-19] 16 (06/13 0923) BP: (73-130)/(33-90) 98/51 (06/13 0923) SpO2:  [93 %-99 %] 96 % (06/13 0923) Weight:  [166.9 kg] 166.9 kg (06/12 1600) Last BM Date : 03/26/22  Intake/Output from previous day: 06/12 0701 - 06/13 0700 In: 3200.9 [P.O.:720; I.V.:2063.9; IV Piggyback:417] Out: 490 [Urine:480; Blood:10] Intake/Output this shift: Total I/O In: 240 [P.O.:240] Out: -   GI: Abd tender, incisions c/d/I w/ glue  Lab Results: CBC  Recent Labs    03/27/22 1705 03/28/22 0420  WBC 16.3* 14.7*  HGB 11.4* 11.2*  HCT 34.5* 33.8*  PLT 206 240   BMET Recent Labs    03/27/22 1705 03/28/22 0420  NA  --  138  K  --  4.4  CL  --  101  CO2  --  29  GLUCOSE  --  142*  BUN  --  27*  CREATININE 1.13* 1.19*  CALCIUM  --  8.4*   PT/INR No results for input(s): "LABPROT", "INR" in the last 72 hours. ABG No results for input(s): "PHART", "HCO3" in the last 72 hours.  Invalid input(s): "PCO2", "PO2"  Anti-infectives: Anti-infectives (From admission, onward)    Start     Dose/Rate Route Frequency Ordered Stop   03/27/22 0600  metroNIDAZOLE (FLAGYL) IVPB 500 mg       See Hyperspace for full Linked Orders Report.   500 mg 100 mL/hr over 60 Minutes Intravenous On call to O.R. 03/27/22 2536 03/27/22 0810   03/27/22 0600  ciprofloxacin (CIPRO) IVPB 400 mg       See Hyperspace for full Linked Orders Report.   400 mg 200 mL/hr over 60 Minutes Intravenous On call to O.R. 03/27/22 0528 03/27/22 0750       Medications: Scheduled Meds:  acetaminophen  650 mg Oral Q6H   docusate sodium  100 mg Oral BID   enoxaparin (LOVENOX) injection  40 mg Subcutaneous Q24H   FLUoxetine  10 mg Oral QPM   gabapentin  300 mg Oral TID    lisinopril  20 mg Oral Daily   metolazone  2.5 mg Oral Daily   metoprolol succinate  25-50 mg Oral Daily   Continuous Infusions:  lactated ringers 75 mL/hr at 03/27/22 2149   methocarbamol (ROBAXIN) IV     PRN Meds:.HYDROmorphone (DILAUDID) injection, methocarbamol (ROBAXIN) IV, ondansetron (ZOFRAN) IV, oxyCODONE, oxyCODONE, prochlorperazine, simethicone  Assessment/Plan: s/p Procedure(s): XI ROBOTIC ASSISTED INCISIONAL HERNIA REPAIR WITH MESH AND BILATERAL TRANSVERSUS ABDOMINIS RELEASE 03/27/2022  Doing well POD1 Feeling a little weak - will consult PT/OT Keep inpatient today until strength improves   LOS: 0 days     Felicie Morn, MD  [facility] Use [email or web address] to contact on call provider

## 2022-03-29 DIAGNOSIS — K430 Incisional hernia with obstruction, without gangrene: Secondary | ICD-10-CM | POA: Diagnosis not present

## 2022-03-29 LAB — BASIC METABOLIC PANEL
Anion gap: 9 (ref 5–15)
BUN: 31 mg/dL — ABNORMAL HIGH (ref 6–20)
CO2: 27 mmol/L (ref 22–32)
Calcium: 8.4 mg/dL — ABNORMAL LOW (ref 8.9–10.3)
Chloride: 101 mmol/L (ref 98–111)
Creatinine, Ser: 1.04 mg/dL — ABNORMAL HIGH (ref 0.44–1.00)
GFR, Estimated: >60 mL/min (ref >60)
Glucose, Bld: 126 mg/dL — ABNORMAL HIGH (ref 70–99)
Potassium: 4.2 mmol/L (ref 3.5–5.1)
Sodium: 137 mmol/L (ref 135–145)

## 2022-03-29 LAB — CBC
HCT: 34.2 % — ABNORMAL LOW (ref 36.0–46.0)
Hemoglobin: 11.2 g/dL — ABNORMAL LOW (ref 12.0–15.0)
MCH: 32.7 pg (ref 26.0–34.0)
MCHC: 32.7 g/dL (ref 30.0–36.0)
MCV: 100 fL (ref 80.0–100.0)
Platelets: 206 10*3/uL (ref 150–400)
RBC: 3.42 MIL/uL — ABNORMAL LOW (ref 3.87–5.11)
RDW: 14.6 % (ref 11.5–15.5)
WBC: 13 10*3/uL — ABNORMAL HIGH (ref 4.0–10.5)
nRBC: 0 % (ref 0.0–0.2)

## 2022-03-29 MED ORDER — OXYCODONE-ACETAMINOPHEN 5-325 MG PO TABS: 1.0000 | ORAL_TABLET | ORAL | 0 refills | Status: AC | PRN

## 2022-03-29 NOTE — Discharge Summary (Signed)
Patient ID: Janet [name] [id number] 61 y.o. 1961/09/29  03/27/2022  Discharge date and time: 03/29/2022  Admitting Physician: Adair  Discharge Physician: Ryan  Admission Diagnoses: Ventral hernia [K43.9] Patient Active Problem List   Diagnosis Date Noted   Ventral hernia 03/27/2022   Paroxysmal atrial fibrillation ([place]) 10/14/2021   DOE (dyspnea on exertion) 10/14/2021   Ascending aortic aneurysm 46 mm in 2022 09/14/2021   Palpitations 36/46/8032   Umbilical hernia 10/08/8249   PONV (postoperative nausea and vomiting) 06/13/2021   Depression 03/70/4888   Complication of anesthesia 06/13/2021   Anemia 06/13/2021   Degenerative arthritis of knee, bilateral 06/05/2019   Essential hypertension 05/12/2019   Hyperlipidemia 05/12/2019   Pedal edema 05/12/2019   Persistent atrial fibrillation ([place]) 05/12/2019   Morbid obesity ([place]) 05/12/2019   Malignant neoplasm of female breast ([place]) 05/12/2019   Routine general medical examination at a health care facility 04/09/2013   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 09/27/2012   Status post left breast reconstruction 07/21/2011   Anxiety 10/16/2010   Pneumonia 10/17/1987     Discharge Diagnoses: Incisional hernia Patient Active Problem List   Diagnosis Date Noted   Ventral hernia 03/27/2022   Paroxysmal atrial fibrillation ([place]) 10/14/2021   DOE (dyspnea on exertion) 10/14/2021   Ascending aortic aneurysm 46 mm in 2022 09/14/2021   Palpitations 91/69/4503   Umbilical hernia 88/82/8003   PONV (postoperative nausea and vomiting) 06/13/2021   Depression 49/17/9150   Complication of anesthesia 06/13/2021   Anemia 06/13/2021   Degenerative arthritis of knee, bilateral 06/05/2019   Essential hypertension 05/12/2019   Hyperlipidemia 05/12/2019   Pedal edema 05/12/2019   Persistent atrial fibrillation ([place]) 05/12/2019   Morbid obesity ([place]) 05/12/2019   Malignant neoplasm of female breast  ([place]) 05/12/2019   Routine general medical examination at a health care facility 04/09/2013   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  She recovered well in the hospital and was discharged.  Consults: None  Significant Diagnostic Studies: None  Treatments: surgery: As above  Disposition: Home  Patient Instructions:  Allergies as of 03/29/2022       Reactions   Petrolatum-zinc Oxide Rash   Allergic to surgical tape - no reaction listed.   Amoxicillin Other (See Comments), Nausea Only, Rash   Decreased heart rate Other reaction(s): Other (See Comments), Unknown "drops heart rate in to the 40s" Decreased heart rate "drops heart rate in to the 40s" Decreased heart rate   Cardizem [diltiazem] Nausea Only   Swelling/lightheaded/body aches/headache/unsteadiness   Clarithromycin Other (See Comments)   Breaks mouth out   Ivp Dye [iodinated Contrast Media] Other (See Comments)   States made bp systolic has increased and diastolic has decreased and hasnt felt well x 3 weeks since dye adm   Tape Dermatitis   Only can have paper tape        Medication List     TAKE these medications    acetaminophen 500 MG tablet Commonly known as: TYLENOL Take 1,000 mg by mouth every 6 (six) hours as needed (pain.).   apixaban 5 MG Tabs tablet Commonly known as: ELIQUIS Take  1 tablet (5 mg total) by mouth 2 (two) times daily.   CALCIUM 500/D PO Take 1 tablet by mouth in the morning.   FLUoxetine 20 MG capsule Commonly known as: PROZAC Take 10 mg by mouth every  evening.   lisinopril 20 MG tablet Commonly known as: ZESTRIL TAKE ONE (1) TABLET BY MOUTH EVERY DAY   metolazone 2.5 MG tablet Commonly known as: ZAROXOLYN Take 1 tablet (2.5 mg total) by mouth daily.   metoprolol succinate 50 MG 24 hr tablet Commonly known as: TOPROL-XL Take 1.5 tablets (75 mg total) by mouth daily. What changed:  how much to take additional instructions   multivitamin with minerals Tabs tablet Take 1 tablet by mouth daily.   oxyCODONE-acetaminophen 5-325 MG tablet Commonly known as: Percocet Take 1 tablet by mouth every 4 (four) hours as needed for severe pain.   SM POTASSIUM PO Take 99 mcg by mouth in the morning.   Tart Cherry Advanced Caps Take 1 capsule by mouth every evening. 1200 mg/capsule   Turmeric 500 MG Caps Take 500 mg by mouth in the morning.   vitamin B-12 500 MCG tablet Commonly known as: CYANOCOBALAMIN Take 50 mcg by mouth in the morning.   vitamin C 500 MG tablet Commonly known as: ASCORBIC ACID Take 500 mg by mouth in the morning.   VITAMIN D3 PO Take 1 tablet by mouth in the morning.        Activity: no heavy lifting for 4 weeks Diet: regular diet Wound Care: keep wound clean and dry  Follow-up:  With Dr. Thermon Leyland in 4 week.  Signed: Nickola Major Michiah Mudry [facility] Bariatric, [facility] Minimally Invasive Surgery [facility], [place]   03/29/2022, 10:28 AM

## 2022-03-29 NOTE — Discharge Instructions (Signed)
 VENTRAL HERNIA REPAIR POST OPERATIVE INSTRUCTIONS  Thinking Clearly  The anesthesia may cause you to feel different for 1 or 2 days. Do not drive, drink alcohol, or make any big decisions for at least 2 days.  Nutrition When you wake up, you will be able to drink small amounts of liquid. If you do not feel sick, you can slowly advance your diet to regular foods. Continue to drink lots of fluids, usually about 8 to 10 glasses per day. Eat a high-fiber diet so you don't strain during bowel movements. High-Fiber Foods Foods high in fiber include beans, bran cereals and whole-grain breads, peas, dried fruit (figs, apricots, and dates), raspberries, blackberries, strawberries, sweet corn, broccoli, baked potatoes with skin, plums, pears, apples, greens, and nuts. Activity Slowly increase your activity. Be sure to get up and walk every hour or so to prevent blood clots. No heavy lifting or strenuous activity for 4 weeks following surgery to prevent hernias at your incision sites or recurrence of your hernia. It is normal to feel tired. You may need more sleep than usual.  Get your rest but make sure to get up and move around frequently to prevent blood clots and pneumonia.  Work and Return to School You can go back to work when you feel well enough. Discuss the timing with your surgeon. You can usually go back to school or work 1 week or less after an laparoscopic or an open repair. If your work requires heavy lifting or strenuous activity you need to be placed on light duty for 4 weeks following surgery. You can return to gym class, sports or other physical activities 4 weeks after surgery.  Wound Care You may experience significant bruising throughout the abdominal wall that may track down into the groin including into the scrotum in males.  Rest, elevating the groin and scrotum above the level of the heart, ice and compression with tight fitting underwear or an abdominal binder can help.   Always wash your hands before and after touching near your incision site. Do not soak in a bathtub until cleared at your follow up appointment. You may take a shower 24 hours after surgery. A small amount of drainage from the incision is normal. If the drainage is thick and yellow or the site is red, you may have an infection, so call your surgeon. If you have a drain in one of your incisions, it will be taken out in office when the drainage stops. Steri-Strips will fall off in 7 to 10 days or they will be removed during your first office visit. If you have dermabond glue covering over the incision, allow the glue to flake off on its own. Protect the new skin, especially from the sun. The sun can burn and cause darker scarring. Your scar will heal in about 4 to 6 weeks and will become softer and continue to fade over the next year.  The cosmetic appearance of the incisions will improve over the course of the first year after surgery. Sensation around your incision will return in a few weeks or months.  Bowel Movements After intestinal surgery, you may have loose watery stools for several days. If watery diarrhea lasts longer than 3 days, contact your surgeon. Pain medication (narcotics) can cause constipation. Increase the fiber in your diet with high-fiber foods if you are constipated. You can take an over the counter stool softener like Colace to avoid constipation.  Additional over the counter medications can also be used   if Colace isn't sufficient (for example, Milk of Magnesia or Miralax).  Pain The amount of pain is different for each person. Some people need only 1 to 3 doses of pain control medication, while others need more. Take alternating doses of tylenol and ibuprofen around the clock for the first five days following surgery.  This will provide a baseline of pain control and help with inflammation.  Take the narcotic pain medication in addition if needed for severe pain.  Contact  Your Surgeon at [phone], if you have: Pain that will not go away Pain that gets worse A fever of more than 101F (38.3C) Repeated vomiting Swelling, redness, bleeding, or bad-smelling drainage from your wound site Strong abdominal pain No bowel movement or unable to pass gas for 3 days Watery diarrhea lasting longer than 3 days  Pain Control The goal of pain control is to minimize pain, keep you moving and help you heal. Your surgical team will work with you on your pain plan. Most often a combination of therapies and medications are used to control your pain. You may also be given medication (local anesthetic) at the surgical site. This may help control your pain for several days. Extreme pain puts extra stress on your body at a time when your body needs to focus on healing. Do not wait until your pain has reached a level "10" or is unbearable before telling your doctor or nurse. It is much easier to control pain before it becomes severe. Following a laparoscopic procedure, pain is sometimes felt in the shoulder. This is due to the gas inserted into your abdomen during the procedure. Moving and walking helps to decrease the gas and the right shoulder pain.  Use the guide below for ways to manage your post-operative pain. Learn more by going to facs.org/safepaincontrol.  How Intense Is My Pain Common Therapies to Feel Better       I hardly notice my pain, and it does not interfere with my activities.  I notice my pain and it distracts me, but I can still do activities (sitting up, walking, standing).  Non-Medication Therapies  Ice (in a bag, applied over clothing at the surgical site), elevation, rest, meditation, massage, distraction (music, TV, play) walking and mild exercise Splinting the abdomen with pillows +  Non-Opioid Medications Acetaminophen (Tylenol) Non-steroidal anti-inflammatory drugs (NSAIDS) Aspirin, Ibuprofen (Motrin, Advil) Naproxen (Aleve) Take these as  needed, when you feel pain. Both acetaminophen and NSAIDs help to decrease pain and swelling (inflammation).      My pain is hard to ignore and is more noticeable even when I rest.  My pain interferes with my usual activities.  Non-Medication Therapies  +  Non-Opioid medications  Take on a regular schedule (around-the-clock) instead of as needed. (For example, Tylenol every 6 hours at 9:00 am, 3:00 pm, 9:00 pm, 3:00 am and Motrin every 6 hours at 12:00 am, 6:00 am, 12:00 pm, 6:00 pm)         I am focused on my pain, and I am not doing my daily activities.  I am groaning in pain, and I cannot sleep. I am unable to do anything.  My pain is as bad as it could be, and nothing else matters.  Non-Medication Therapies  +  Around-the-Clock Non-Opioid Medications  +  Short-acting opioids  Opioids should be used with other medications to manage severe pain. Opioids block pain and give a feeling of euphoria (feel high). Addiction, a serious side effect of opioids, is   rare with short-term (a few days) use.  Examples of short-acting opioids include: Tramadol (Ultram), Hydrocodone (Norco, Vicodin), Hydromorphone (Dilaudid), Oxycodone (Oxycontin)     The above directions have been adapted from the American College of Surgeons Surgical Patient Education Program.  Please refer to the ACS website if needed: [email or web address]   Scottlyn Mchaney, MD [facility] [address], [address], [place], [place]  [address] ?  [address] [address], [place], [place]   [address] [phone] ? [phone] ? FAX [phone] Web site: [email or web address]  

## 2022-03-29 NOTE — Progress Notes (Signed)
Discharge instructions given to patient and all questions were answered.  

## 2022-03-30 ENCOUNTER — Telehealth: Payer: Self-pay | Admitting: *Deleted

## 2022-03-30 NOTE — Telephone Encounter (Addendum)
Transition Care Management Follow-up Telephone Call Date of discharge and from where: 03/29/22  at [facility] How have you been since you were released from the hospital? 'ROTTEN" Titus DUE TO CONSTIPATION, PAIN GETTING BETTER AND HAS NOW HAD BOWEL MOVEMENT (DISCHARGE NURSE SUGGESTED TAKING MIRALAX THAT HELPED) Any questions or concerns? No  Items Reviewed: Did the pt receive and understand the discharge instructions provided? Yes  Medications obtained and verified? Yes  HAS MEDICINE JUST NOT TAKEN YET Other? No  Any new allergies since your discharge? No  Dietary orders reviewed? Yes Do you have support at home? Yes  [place] and Equipment/Supplies: Were home health services ordered? no If so, what is the name of the agency? NA  Has the agency set up a time to come to the patient's home? not applicable Were any new equipment or medical supplies ordered?  No What is the name of the medical supply agency? NA Were you able to get the supplies/equipment? not applicable Do you have any questions related to the use of the equipment or supplies? No  Functional Questionnaire: (I = Independent and D = Dependent) ADLs: I  Bathing/Dressing- I   Meal Prep- I  Eating- I  Maintaining continence- I  Transferring/Ambulation- I  Managing Meds- I  Follow up appointments reviewed:  PCP Hospital f/u appt confirmed? No  Scheduled to see INSTRUCTED AND ENCOURAGED TO CALL AND ARRANGE/ EMAIL SENT TO [facility] f/u appt confirmed? No  , STATED SURGEON OFFICE WAS TO CALL HER WITH APPOINTMENT; INSTRUCTED AND ENCOURAGED TO CALL AND SCHEDULE FOLLOW UP Are transportation arrangements needed? No , NEIGHBOR DRIVING If their condition worsens, is the pt aware to call PCP or go to the Emergency Dept.? Yes Was the patient provided with contact information for the PCP's office or ED? Yes Was to pt encouraged  to call back with questions or concerns? Yes   Referral made to [place] team to notify of recent hospital discharge and need for assistance with post hospital discharge appointment scheduling with PCP.      Hubert Azure RN, MSN RN Care Management Coordinator  [place] [phone] [email or web address]

## 2022-03-30 NOTE — Telephone Encounter (Signed)
Error

## 2022-03-31 NOTE — Telephone Encounter (Signed)
Noted  

## 2022-03-31 NOTE — Telephone Encounter (Signed)
FYI, see TCM notes.

## 2022-03-31 NOTE — Telephone Encounter (Signed)
FYI I have scheduled patient for 7/17.  Patient did not want to come in sooner due to not being able to drive for a month after surgery.

## 2022-05-01 ENCOUNTER — Inpatient Hospital Stay: Payer: Managed Care, Other (non HMO) | Admitting: Physician Assistant

## 2022-05-08 ENCOUNTER — Other Ambulatory Visit (HOSPITAL_COMMUNITY): Payer: Managed Care, Other (non HMO)

## 2022-07-10 ENCOUNTER — Other Ambulatory Visit: Payer: Self-pay | Admitting: Cardiology

## 2022-09-21 NOTE — Progress Notes (Shared)
[facility]  [address] 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   Hernia, for which she is being scheduled for surgery in the near future.  In regards to her breast cancer, she continues to do well, we will see her back in December with unilateral right mammogram.  She understands and agrees with this plan of care.   I provided 15 minutes of face-to-face time during this this encounter and > 50% was spent counseling as documented under my assessment and plan.    Derwood Kaplan, MD [facility] [address] 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  Pathology revealed a 6 cm grade 1-2 ductal carcinoma in situ with 3 negative sentinel nodes for a Tis N0 M0.  Estrogen and progesterone receptors were positive and her 2 positive with a Ki 67 of 10%.  I did not recommend any further therapy but we did discuss chemo prevention and she declined.  She later had left breast reconstruction and later hysterectomy as well.  In fact she has had many surgeries.  She denies any chest pain or shortness of breath but does occasionally get brief periods of burning sensation in her right breast area.  She did have a vitamin-D deficiency, and her level dropped from 36 to 34.2 this year.  I reviewed her family history and her father had prostate  cancer with spread to bones and lung.  A maternal great aunt had breast cancer which later recurred.  Two maternal cousins had abnormalities but not cancer.  We know she had the DCIS but she also thinks she may have had cancer of the uterus or ovaries when she had her hysterectomy in 2013.  I tracked down her pathology and this was benign.  She therefore does not fit the criteria for genetic testing, but I told her she might if another relative developed breast cancer.    INTERVAL HISTORY:  I have reviewed her chart and materials related to her cancer extensively and collaborated history with the patient. Summary of oncologic history is as follows: Oncology History  Malignant neoplasm of female breast ([facility])  09/26/2010 Cancer Staging   Staging form: Breast, AJCC 8th Edition - Clinical stage from 09/26/2010: Stage 0 (cTis (DCIS), cN0(sn), cM0, G2, ER+, PR+, HER2+) - Signed by Derwood Kaplan, MD on 07/07/2021 Histopathologic type: Intraductal carcinoma, noninfiltrating, NOS Stage prefix: Initial diagnosis Method of lymph node assessment: Sentinel lymph node biopsy Nuclear grade: G2 Multigene prognostic tests performed: None Histologic grading system: 3 grade system Laterality: Left Tumor size (mm): 60 Lymph-vascular invasion (LVI): LVI not present (absent)/not identified Diagnostic confirmation: Positive histology Specimen type: Excision Staged by: Managing physician Menopausal status: Premenopausal Ki-67 (%): 10 Stage used in treatment planning: Yes National guidelines used in treatment planning: Yes Type of national guideline used in  treatment planning: NCCN   05/12/2019 Initial Diagnosis   Malignant neoplasm of female breast [facility])     [name] is here for annual follow up and states that she is doing somewhat poorly.       She states that she is being scheduled for hernia repair in the near future. She has been cleared by Dr. Agustin Cree for the procedure. She rates her pain as  a 3/10 today.  Annual unilateral right mammogram from November 2022 was clear.  She continues routine lab work through her primary care physician.  Her  appetite is good, and she has lost 10 pounds since her last visit  She denies fever, chills or other signs of infection.  She denies nausea, vomiting, bowel issues, or abdominal pain.  She denies sore throat, cough, dyspnea, or chest pain.  HISTORY:   Allergies:  Allergies  Allergen Reactions   Petrolatum-Zinc Oxide Rash    Allergic to surgical tape - no reaction listed.   Amoxicillin Other (See Comments), Nausea Only and Rash    Decreased heart rate Other reaction(s): Other (See Comments), Unknown "drops heart rate in to the 40s" Decreased heart rate "drops heart rate in to the 40s" Decreased heart rate   Cardizem [Diltiazem] Nausea Only    Swelling/lightheaded/body aches/headache/unsteadiness   Clarithromycin Other (See Comments)    Breaks mouth out   Ivp Dye [Iodinated Contrast Media] Other (See Comments)    States made bp systolic has increased and diastolic has decreased and hasnt felt well x 3 weeks since dye adm   Tape Dermatitis    Only can have paper tape    Current Medications: Current Outpatient Medications  Medication Sig Dispense Refill   acetaminophen (TYLENOL) 500 MG tablet Take 1,000 mg by mouth every 6 (six) hours as needed (pain.).     apixaban (ELIQUIS) 5 MG TABS tablet Take 1 tablet (5 mg total) by mouth 2 (two) times daily. 60 tablet 12   Calcium Carb-Cholecalciferol (CALCIUM 500/D PO) Take 1 tablet by mouth in the morning.     Cholecalciferol (VITAMIN D3 PO) Take 1 tablet by mouth in the morning.     FLUoxetine (PROZAC) 20 MG capsule Take 10 mg by mouth every evening.     lisinopril (ZESTRIL) 20 MG tablet TAKE ONE (1) TABLET BY MOUTH EVERY DAY 90 tablet 1   metolazone (ZAROXOLYN) 2.5 MG tablet Take 1 tablet (2.5 mg total) by mouth daily. 30 tablet 12   metoprolol succinate (TOPROL-XL) 50 MG 24 hr tablet  Take 1.5 tablets (75 mg total) by mouth daily. 135 tablet 0   Misc Natural Products (TART CHERRY ADVANCED) CAPS Take 1 capsule by mouth every evening. 1200 mg/capsule     Multiple Vitamin (MULTIVITAMIN WITH MINERALS) TABS Take 1 tablet by mouth daily.     oxyCODONE-acetaminophen (PERCOCET) 5-325 MG tablet Take 1 tablet by mouth every 4 (four) hours as needed for severe pain. 10 tablet 0   Potassium Gluconate (SM POTASSIUM PO) Take 99 mcg by mouth in the morning.     Turmeric 500 MG CAPS Take 500 mg by mouth in the morning.     vitamin B-12 (CYANOCOBALAMIN) 500 MCG tablet Take 50 mcg by mouth in the morning.     vitamin C (ASCORBIC ACID) 500 MG tablet Take 500 mg by mouth in the morning.     No current facility-administered medications for this visit.    REVIEW OF SYSTEMS:  Review of Systems  Constitutional: Negative.  Negative for appetite change,  chills, fatigue, fever and unexpected weight change.  HENT:  Negative.    Eyes: Negative.   Respiratory: Negative.  Negative for chest tightness, cough, hemoptysis, shortness of breath and wheezing.   Cardiovascular: Negative.  Negative for chest pain, leg swelling and palpitations.  Gastrointestinal:  Positive for abdominal pain (secondary to hernia). Negative for abdominal distention, blood in stool, constipation, diarrhea, nausea and vomiting.  Endocrine: Negative.   Genitourinary: Negative.  Negative for difficulty urinating, dysuria, frequency and hematuria.   Musculoskeletal: Negative.  Negative for arthralgias, back pain, flank pain, gait problem and myalgias.  Skin: Negative.   Neurological: Negative.  Negative for dizziness, extremity weakness, gait problem, headaches, light-headedness, numbness, seizures and speech difficulty.  Hematological: Negative.   Psychiatric/Behavioral: Negative.  Negative for depression and sleep disturbance. The patient is not nervous/anxious.       VITALS:  Last menstrual period 03/15/2012.  Wt Readings  from Last 3 Encounters:  03/27/22 (!) 368 lb (166.9 kg)  03/21/22 (!) 368 lb (166.9 kg)  01/27/22 (!) 361 lb (163.7 kg)    There is no height or weight on file to calculate BMI.  Performance status (ECOG): 1 - Symptomatic but completely ambulatory  PHYSICAL EXAM:  Physical Exam Constitutional:      General: She is not in acute distress.    Appearance: Normal appearance. She is normal weight.  HENT:     Head: Normocephalic and atraumatic.  Eyes:     General: No scleral icterus.    Extraocular Movements: Extraocular movements intact.     Conjunctiva/sclera: Conjunctivae normal.     Pupils: Pupils are equal, round, and reactive to light.  Cardiovascular:     Rate and Rhythm: Normal rate and regular rhythm.     Pulses: Normal pulses.     Heart sounds: Normal heart sounds. No murmur heard.    No friction rub. No gallop.  Pulmonary:     Effort: Pulmonary effort is normal. No respiratory distress.     Breath sounds: Normal breath sounds.  Chest:  Breasts:    Right: Normal.     Left: Normal.     Comments: Right breast without masses. Left reconstruction is negative, but there is scar tissue in the lower left axilla. Abdominal:     General: Bowel sounds are normal. There is no distension.     Palpations: Abdomen is soft. There is no hepatomegaly, splenomegaly or mass.     Tenderness: There is no abdominal tenderness.  Musculoskeletal:        General: Normal range of motion.     Cervical back: Normal range of motion and neck supple.     Right lower leg: No edema.     Left lower leg: No edema.  Lymphadenopathy:     Cervical: No cervical adenopathy.  Skin:    General: Skin is warm and dry.  Neurological:     General: No focal deficit present.     Mental Status: She is alert and oriented to person, place, and time. Mental status is at baseline.  Psychiatric:        Mood and Affect: Mood normal.        Behavior: Behavior normal.        Thought Content: Thought content normal.         Judgment: Judgment normal.      LABS:      Latest Ref Rng & Units 03/29/2022    5:10 AM 03/28/2022    4:20 AM 03/27/2022  5:05 PM  CBC  WBC 4.0 - 10.5 K/uL 13.0  14.7  16.3   Hemoglobin 12.0 - 15.0 g/dL 11.2  11.2  11.4   Hematocrit 36.0 - 46.0 % 34.2  33.8  34.5   Platelets 150 - 400 K/uL 206  240  206       Latest Ref Rng & Units 03/29/2022    5:10 AM 03/28/2022    4:20 AM 03/27/2022    5:05 PM  CMP  Glucose 70 - 99 mg/dL 126  142    BUN 6 - 20 mg/dL 31  27    Creatinine 0.44 - 1.00 mg/dL 1.04  1.19  1.13   Sodium 135 - 145 mmol/L 137  138    Potassium 3.5 - 5.1 mmol/L 4.2  4.4    Chloride 98 - 111 mmol/L 101  101    CO2 22 - 32 mmol/L 27  29    Calcium 8.9 - 10.3 mg/dL 8.4  8.4      STUDIES:  No results found.    EXAM: 09/14/2021 DIGITAL SCREENING UNILATERAL RIGHT MAMMOGRAM WITH CAD AND TOMOSYNTHESIS   TECHNIQUE:  Right screening digital craniocaudal and mediolateral oblique  mammograms were obtained. Right screening digital breast  tomosynthesis was performed. The images were evaluated with  computer-aided detection.   COMPARISON: Previous exam(s).   ACR Breast Density Category b: There are scattered areas of  fibroglandular density.   FINDINGS:  The patient has had a left mastectomy. There are no findings  suspicious for malignancy.   IMPRESSION:  No mammographic evidence of malignancy.

## 2022-09-22 ENCOUNTER — Telehealth: Payer: Self-pay

## 2022-09-22 ENCOUNTER — Ambulatory Visit: Payer: Managed Care, Other (non HMO) | Admitting: Oncology

## 2022-09-22 NOTE — Telephone Encounter (Signed)
-----   Message from Derwood Kaplan, MD sent at 09/22/2022  9:17 AM EST ----- Regarding: mammo Did she have her mammo last week?

## 2022-09-22 NOTE — Telephone Encounter (Signed)
Mammogram has not been scheduled yet. Janet [name] is working on it.

## 2022-11-03 ENCOUNTER — Other Ambulatory Visit: Payer: Self-pay | Admitting: Cardiology

## 2022-11-03 DIAGNOSIS — I480 Paroxysmal atrial fibrillation: Secondary | ICD-10-CM

## 2022-11-03 NOTE — Telephone Encounter (Signed)
Per care everywhere pt now sees Dr. Clydie Braun in [place]. Will request prescription be sent to that office.

## 2022-11-11 ENCOUNTER — Other Ambulatory Visit: Payer: Self-pay | Admitting: Cardiology

## 2022-11-11 DIAGNOSIS — R0602 Shortness of breath: Secondary | ICD-10-CM

## 2022-11-11 DIAGNOSIS — I480 Paroxysmal atrial fibrillation: Secondary | ICD-10-CM

## 2023-05-28 ENCOUNTER — Telehealth: Payer: Self-pay | Admitting: Physician Assistant

## 2023-05-28 NOTE — Telephone Encounter (Signed)
Called patient no answer left VM to call office back to schedule next available cpe.
